# Patient Record
Sex: Female | Born: 1970 | Race: White | Hispanic: No | Marital: Married | Smoking: Never smoker
Health system: Southern US, Community
[De-identification: ages and names within clinical notes are randomized; demographics above are authoritative.]

## PROBLEM LIST (undated history)

## (undated) ENCOUNTER — Ambulatory Visit: Payer: 59

## (undated) DIAGNOSIS — K219 Gastro-esophageal reflux disease without esophagitis: Secondary | ICD-10-CM

## (undated) DIAGNOSIS — F419 Anxiety disorder, unspecified: Secondary | ICD-10-CM

## (undated) DIAGNOSIS — E669 Obesity, unspecified: Secondary | ICD-10-CM

## (undated) DIAGNOSIS — I209 Angina pectoris, unspecified: Secondary | ICD-10-CM

## (undated) DIAGNOSIS — R0600 Dyspnea, unspecified: Secondary | ICD-10-CM

## (undated) DIAGNOSIS — R109 Unspecified abdominal pain: Secondary | ICD-10-CM

## (undated) DIAGNOSIS — I350 Nonrheumatic aortic (valve) stenosis: Secondary | ICD-10-CM

## (undated) DIAGNOSIS — R011 Cardiac murmur, unspecified: Secondary | ICD-10-CM

## (undated) DIAGNOSIS — D239 Other benign neoplasm of skin, unspecified: Secondary | ICD-10-CM

## (undated) DIAGNOSIS — G4730 Sleep apnea, unspecified: Secondary | ICD-10-CM

## (undated) DIAGNOSIS — R631 Polydipsia: Secondary | ICD-10-CM

## (undated) DIAGNOSIS — J309 Allergic rhinitis, unspecified: Secondary | ICD-10-CM

## (undated) HISTORY — PX: APPENDECTOMY: SHX54

## (undated) HISTORY — DX: Anxiety disorder, unspecified: F41.9

## (undated) HISTORY — DX: Nonrheumatic aortic (valve) stenosis: I35.0

---

## 2001-02-26 ENCOUNTER — Other Ambulatory Visit: Admission: RE | Admit: 2001-02-26 | Discharge: 2001-02-26 | Payer: Self-pay | Admitting: Family Medicine

## 2004-11-17 ENCOUNTER — Ambulatory Visit: Payer: Self-pay | Admitting: Family Medicine

## 2005-09-26 ENCOUNTER — Observation Stay: Payer: Self-pay | Admitting: Unknown Physician Specialty

## 2005-10-20 ENCOUNTER — Ambulatory Visit: Payer: Self-pay

## 2005-11-02 ENCOUNTER — Inpatient Hospital Stay: Payer: Self-pay

## 2006-01-21 ENCOUNTER — Emergency Department: Payer: Self-pay | Admitting: Emergency Medicine

## 2007-09-05 ENCOUNTER — Ambulatory Visit: Payer: Self-pay | Admitting: Unknown Physician Specialty

## 2008-04-18 ENCOUNTER — Ambulatory Visit: Payer: Self-pay | Admitting: Internal Medicine

## 2008-06-19 HISTORY — PX: TONSILLECTOMY: SUR1361

## 2009-01-28 ENCOUNTER — Ambulatory Visit: Payer: Self-pay | Admitting: Unknown Physician Specialty

## 2009-06-19 HISTORY — PX: ABDOMINAL HYSTERECTOMY: SHX81

## 2009-09-06 ENCOUNTER — Ambulatory Visit: Payer: Self-pay | Admitting: Unknown Physician Specialty

## 2010-07-15 ENCOUNTER — Ambulatory Visit: Payer: Self-pay | Admitting: Internal Medicine

## 2010-07-21 ENCOUNTER — Emergency Department: Payer: Self-pay | Admitting: Emergency Medicine

## 2010-08-14 ENCOUNTER — Ambulatory Visit: Payer: Self-pay

## 2010-11-12 ENCOUNTER — Ambulatory Visit: Payer: Self-pay | Admitting: Family Medicine

## 2011-01-27 ENCOUNTER — Ambulatory Visit: Payer: Self-pay | Admitting: Family Medicine

## 2011-03-31 ENCOUNTER — Ambulatory Visit: Payer: Self-pay | Admitting: Internal Medicine

## 2012-03-31 ENCOUNTER — Ambulatory Visit: Payer: Self-pay | Admitting: Physician Assistant

## 2013-02-21 ENCOUNTER — Ambulatory Visit: Payer: Self-pay | Admitting: Family Medicine

## 2013-07-18 ENCOUNTER — Ambulatory Visit: Payer: Self-pay | Admitting: Physician Assistant

## 2013-10-30 ENCOUNTER — Encounter: Payer: Self-pay | Admitting: *Deleted

## 2013-11-05 ENCOUNTER — Encounter: Payer: Self-pay | Admitting: General Surgery

## 2013-11-05 ENCOUNTER — Other Ambulatory Visit: Payer: Managed Care, Other (non HMO)

## 2013-11-05 ENCOUNTER — Ambulatory Visit (INDEPENDENT_AMBULATORY_CARE_PROVIDER_SITE_OTHER): Payer: Managed Care, Other (non HMO) | Admitting: General Surgery

## 2013-11-05 VITALS — BP 134/76 | HR 74 | Resp 12 | Ht 66.0 in | Wt 251.0 lb

## 2013-11-05 DIAGNOSIS — N63 Unspecified lump in unspecified breast: Secondary | ICD-10-CM

## 2013-11-05 NOTE — Patient Instructions (Signed)
Patient to return in 3 months for follow up visit. Continue monthly self breast exams. Call office for any new breast issues or concerns.

## 2013-11-05 NOTE — Progress Notes (Signed)
Patient ID: Natasha Boyd, female   DOB: [birth year], 43 y.o.   MRN: [id number]  Chief Complaint  Patient presents with  . Other    Right breast nodule    HPI Natasha Boyd is a 43 y.o. female who presents for a breast evaluation. The most recent mammogram was done on 10/30/13 at [facility]. Patient does not  perform regular self breast checks and gets regular mammograms done. The patient states she noticed a right breast lump approximately 1 month ago. She states it has gotten larger. It is tender to the touch.  Also feels a small lump in right neck.  HPI  Past Medical History  Diagnosis Date  . Anxiety     Past Surgical History  Procedure Laterality Date  . Abdominal hysterectomy  2011  . Appendectomy  1996?  Marland Kitchen Cesarean section  2007  . Tonsillectomy  2010    Family History  Problem Relation Age of Onset  . Adopted: Yes    Social History History  Substance Use Topics  . Smoking status: Former Smoker -- 1.00 packs/day for 31 years    Types: Cigarettes  . Smokeless tobacco: Never Used  . Alcohol Use: Yes    Allergies  Allergen Reactions  . Iodine     Current Outpatient Prescriptions  Medication Sig Dispense Refill  . ALPRAZolam (XANAX) 0.25 MG tablet Take 0.25 mg by mouth as needed for anxiety.      Marland Kitchen escitalopram (LEXAPRO) 20 MG tablet Take 20 mg by mouth daily.       No current facility-administered medications for this visit.    Review of Systems Review of Systems  Constitutional: Negative.   Respiratory: Negative.   Cardiovascular: Negative.     Blood pressure 134/76, pulse 74, resp. rate 12, height 5\' 6"  (1.676 m), weight 251 lb (113.853 kg).  Physical Exam Physical Exam  Constitutional: She is oriented to person, place, and time. She appears well-developed and well-nourished.  Eyes: Conjunctivae are normal. No scleral icterus.  Neck: Neck supple. No thyromegaly present.  Cardiovascular: Normal rate, regular rhythm and normal heart sounds.    Pulmonary/Chest: Effort normal and breath sounds normal. Right breast exhibits mass (at 1 o'clock 1 cm superficial mass. ). Right breast exhibits no inverted nipple, no nipple discharge, no skin change and no tenderness. Left breast exhibits no inverted nipple, no mass, no nipple discharge, no skin change and no tenderness.     Abdominal: Soft. Bowel sounds are normal. There is no tenderness.  Lymphadenopathy:    She has cervical adenopathy ( 5 mm firm node upper right anterior cervical neck. ).    She has no axillary adenopathy.  Neurological: She is alert and oriented to person, place, and time.  Skin: Skin is warm and dry.    Data Reviewed  Mammogram report or films not available. Will try to obtain this.  Targeted ultrasound over palpable right breast mass shows no findings.  Assessment    Likely a benign finding in the right breast.     Plan    Recheck in 3 months for right breast mass and tiny cervical node.        Seeplaputhur G Sankar 11/05/2013, 6:01 PM

## 2014-02-02 ENCOUNTER — Ambulatory Visit: Payer: Self-pay | Admitting: General Surgery

## 2014-02-13 DIAGNOSIS — D239 Other benign neoplasm of skin, unspecified: Secondary | ICD-10-CM | POA: Insufficient documentation

## 2014-03-04 ENCOUNTER — Ambulatory Visit: Payer: Self-pay | Admitting: General Surgery

## 2014-03-10 ENCOUNTER — Ambulatory Visit: Payer: Self-pay | Admitting: Family Medicine

## 2014-03-16 LAB — HEPATIC FUNCTION PANEL
ALT: 35 U/L (ref 7–35)
AST: 26 U/L (ref 13–35)

## 2014-03-16 LAB — BASIC METABOLIC PANEL
BUN: 13 mg/dL (ref 4–21)
CREATININE: 0.9 mg/dL (ref 0.5–1.1)
GLUCOSE: 98 mg/dL
Potassium: 4.6 mmol/L (ref 3.4–5.3)
Sodium: 140 mmol/L (ref 137–147)

## 2014-03-16 LAB — LIPID PANEL
CHOLESTEROL: 201 mg/dL — AB (ref 0–200)
HDL: 34 mg/dL — AB (ref 35–70)
LDL Cholesterol: 125 mg/dL
Triglycerides: 209 mg/dL — AB (ref 40–160)

## 2014-03-16 LAB — CBC AND DIFFERENTIAL
HCT: 41 % (ref 36–46)
Hemoglobin: 14.1 g/dL (ref 12.0–16.0)
PLATELETS: 193 10*3/uL (ref 150–399)
WBC: 6.4 10*3/mL

## 2014-04-13 ENCOUNTER — Ambulatory Visit: Payer: Self-pay | Admitting: Physician Assistant

## 2014-04-15 ENCOUNTER — Encounter: Payer: Self-pay | Admitting: *Deleted

## 2014-04-20 ENCOUNTER — Encounter: Payer: Self-pay | Admitting: General Surgery

## 2014-08-28 LAB — HEMOGLOBIN A1C: HEMOGLOBIN A1C: 5.7 % (ref 4.0–6.0)

## 2014-09-17 ENCOUNTER — Emergency Department
Admit: 2014-09-17 | Disposition: A | Discharge status: Home / Self Care | Payer: Self-pay | Admitting: Emergency Medicine

## 2014-12-15 ENCOUNTER — Other Ambulatory Visit: Payer: Self-pay

## 2014-12-15 DIAGNOSIS — F988 Other specified behavioral and emotional disorders with onset usually occurring in childhood and adolescence: Secondary | ICD-10-CM

## 2014-12-15 MED ORDER — METHYLPHENIDATE HCL ER (OSM) 27 MG PO TBCR: 27.0000 mg | EXTENDED_RELEASE_TABLET | Freq: Every day | ORAL | Status: DC

## 2014-12-15 NOTE — Telephone Encounter (Signed)
Pt would like to change to Generic Concerta secondary to cost.  She tolerated Adderall well but her insurance will not cover it.    Thanks,   -Mickel Baas

## 2014-12-15 NOTE — Telephone Encounter (Signed)
Prescription printed. Please notify patient it is ready for pick up. Thanks- Dr. Malon Branton.  

## 2015-01-22 ENCOUNTER — Other Ambulatory Visit: Payer: Self-pay

## 2015-01-22 DIAGNOSIS — F988 Other specified behavioral and emotional disorders with onset usually occurring in childhood and adolescence: Secondary | ICD-10-CM

## 2015-01-22 MED ORDER — METHYLPHENIDATE HCL ER (OSM) 36 MG PO TBCR: 36.0000 mg | EXTENDED_RELEASE_TABLET | Freq: Every day | ORAL | Status: DC

## 2015-01-22 NOTE — Telephone Encounter (Signed)
Pt is calling for a refill on Concerta.  She reports tolerating it well, but she is not to goal.  She would like to try the next dose higher if possible.   Thanks,   -Mickel Baas

## 2015-01-22 NOTE — Telephone Encounter (Signed)
Printed.  Please notify patient. Thanks.  

## 2015-03-04 ENCOUNTER — Other Ambulatory Visit: Payer: Self-pay

## 2015-03-04 DIAGNOSIS — F988 Other specified behavioral and emotional disorders with onset usually occurring in childhood and adolescence: Secondary | ICD-10-CM

## 2015-03-04 MED ORDER — METHYLPHENIDATE HCL ER (OSM) 36 MG PO TBCR: 36.0000 mg | EXTENDED_RELEASE_TABLET | Freq: Every day | ORAL | Status: DC

## 2015-03-04 NOTE — Telephone Encounter (Signed)
Pt reports Concerta 36mg  works very well for her.  She would like to pick up 3 months of prescriptions.    Thanks,   -Mickel Baas

## 2015-03-04 NOTE — Telephone Encounter (Signed)
Printed.  Please notify patient. Thanks.  

## 2015-03-16 ENCOUNTER — Encounter: Payer: Self-pay | Admitting: Family Medicine

## 2015-03-16 ENCOUNTER — Ambulatory Visit (INDEPENDENT_AMBULATORY_CARE_PROVIDER_SITE_OTHER): Payer: Managed Care, Other (non HMO) | Admitting: Family Medicine

## 2015-03-16 VITALS — BP 118/72 | HR 98 | Temp 98.2°F | Resp 16 | Ht 65.5 in | Wt 252.0 lb

## 2015-03-16 DIAGNOSIS — Z23 Encounter for immunization: Secondary | ICD-10-CM | POA: Insufficient documentation

## 2015-03-16 DIAGNOSIS — R748 Abnormal levels of other serum enzymes: Secondary | ICD-10-CM

## 2015-03-16 DIAGNOSIS — F419 Anxiety disorder, unspecified: Secondary | ICD-10-CM | POA: Insufficient documentation

## 2015-03-16 DIAGNOSIS — M545 Low back pain, unspecified: Secondary | ICD-10-CM | POA: Insufficient documentation

## 2015-03-16 DIAGNOSIS — R739 Hyperglycemia, unspecified: Secondary | ICD-10-CM | POA: Insufficient documentation

## 2015-03-16 DIAGNOSIS — E669 Obesity, unspecified: Secondary | ICD-10-CM | POA: Insufficient documentation

## 2015-03-16 DIAGNOSIS — Z111 Encounter for screening for respiratory tuberculosis: Secondary | ICD-10-CM

## 2015-03-16 DIAGNOSIS — G4730 Sleep apnea, unspecified: Secondary | ICD-10-CM | POA: Insufficient documentation

## 2015-03-16 DIAGNOSIS — K219 Gastro-esophageal reflux disease without esophagitis: Secondary | ICD-10-CM | POA: Insufficient documentation

## 2015-03-16 DIAGNOSIS — J309 Allergic rhinitis, unspecified: Secondary | ICD-10-CM | POA: Insufficient documentation

## 2015-03-16 DIAGNOSIS — F988 Other specified behavioral and emotional disorders with onset usually occurring in childhood and adolescence: Secondary | ICD-10-CM

## 2015-03-16 DIAGNOSIS — E781 Pure hyperglyceridemia: Secondary | ICD-10-CM | POA: Insufficient documentation

## 2015-03-16 DIAGNOSIS — F909 Attention-deficit hyperactivity disorder, unspecified type: Secondary | ICD-10-CM

## 2015-03-16 DIAGNOSIS — Z0184 Encounter for antibody response examination: Secondary | ICD-10-CM

## 2015-03-16 NOTE — Progress Notes (Signed)
Subjective:    Patient ID: Natasha Boyd, female    DOB: 1971/05/25, 44 y.o.   MRN: [id number]  HPI  Immunization Update:    Pt is coming in today to update her Immunizations for school.  She will need her Tdap and Flu vaccine, and will and need an MMR Titer.    ADHD:  Pt is also following up with her ADHD, she has recently increased her Concerta to $RemoveBef'36mg'AxhMIclubV$  a day.  She reports good tolerance and symptom control.  No side effects to the medication.  More active.  Really doing much better.  Has been able to prioritize and is going back to school to get her Master's.  Mood is much better also.    Patient Active Problem List   Diagnosis Date Noted  . Adult-onset obesity 03/16/2015  . Allergic rhinitis 03/16/2015  . Anxiety 03/16/2015  . Abnormal liver enzymes 03/16/2015  . Acid reflux 03/16/2015  . Blood glucose elevated 03/16/2015  . Hypertriglyceridemia 03/16/2015  . LBP (low back pain) 03/16/2015  . Apnea, sleep 03/16/2015  . ADD (attention deficit disorder) 12/15/2014   Family History  Problem Relation Age of Onset  . Adopted: Yes  . Family history unknown: Yes   Social History   Social History  . Marital Status: Married    Spouse Name: N/A  . Number of Children: N/A  . Years of Education: N/A   Occupational History  . Not on file.   Social History Main Topics  . Smoking status: Former Smoker -- 1.00 packs/day for 31 years    Types: Cigarettes  . Smokeless tobacco: Never Used  . Alcohol Use: Yes  . Drug Use: No  . Sexual Activity: Not on file   Other Topics Concern  . Not on file   Social History Narrative   Past Surgical History  Procedure Laterality Date  . Abdominal hysterectomy  2011  . Appendectomy  1996?  Marland Kitchen Cesarean section  2007  . Tonsillectomy  2010   Allergies  Allergen Reactions  . Iodine    Previous Medications   ALPRAZOLAM (XANAX) 0.25 MG TABLET    Take 0.25 mg by mouth as needed for anxiety.   AMPHETAMINE-DEXTROAMPHETAMINE  (ADDERALL XR) 10 MG 24 HR CAPSULE    Take by mouth.   ESCITALOPRAM (LEXAPRO) 20 MG TABLET    Take 20 mg by mouth daily.   FLUTICASONE (FLONASE) 50 MCG/ACT NASAL SPRAY    Place into the nose.   LANSOPRAZOLE (PREVACID) 30 MG CAPSULE    Take by mouth.   METAXALONE (SKELAXIN) 800 MG TABLET    Take by mouth.   METHYLPHENIDATE (CONCERTA) 36 MG PO CR TABLET    Take 1 tablet (36 mg total) by mouth daily. May be filled after 04/03/2015   METHYLPHENIDATE (CONCERTA) 36 MG PO CR TABLET    Take 1 tablet (36 mg total) by mouth daily. May be filled after 05/04/2015   There were no vitals taken for this visit.   Review of Systems  Constitutional: Negative.   Respiratory: Negative.   Cardiovascular: Negative.   Gastrointestinal: Negative.   Neurological: Negative for dizziness, light-headedness and headaches.  Psychiatric/Behavioral: Negative for suicidal ideas, hallucinations, behavioral problems, confusion, sleep disturbance, self-injury, dysphoric mood, decreased concentration and agitation. The patient is nervous/anxious (Has improved greatly since starting Concerta.). The patient is not hyperactive.        Objective:   Physical Exam  Constitutional: She is oriented to person, place, and time. She appears well-developed  and well-nourished.  Cardiovascular: Normal rate and regular rhythm.   Pulmonary/Chest: Effort normal and breath sounds normal.  Neurological: She is alert and oriented to person, place, and time.  Psychiatric: She has a normal mood and affect. Her behavior is normal. Judgment and thought content normal.   BP 118/72 mmHg  Pulse 98  Temp(Src) 98.2 F (36.8 C) (Oral)  Resp 16  Ht 5' 5.5" (1.664 m)  Wt 252 lb (114.306 kg)  BMI 41.28 kg/m2    Assessment & Plan:  1. Immunization due Given today.  - Tdap vaccine greater than or equal to 7yo IM  2. Attention deficit hyperactivity disorder (ADHD), unspecified ADHD type Stable. Continue medication.    3. Abnormal liver  enzymes Will check labs. Has lost some weight.  - Comprehensive metabolic panel  4. Anxiety Improving. Continue medication.   5. Screening-pulmonary TB - Quantiferon tb gold assay  7. Immunity status testing - Measles/Mumps/Rubella Immunity  8. Flu vaccine need - Flu Vaccine QUAD 36+ mos PF IM (Fluarix & Fluzone Quad PF)  Margarita Rana, MD

## 2015-03-18 LAB — COMPREHENSIVE METABOLIC PANEL
ALK PHOS: 84 IU/L (ref 39–117)
ALT: 71 IU/L — AB (ref 0–32)
AST: 54 IU/L — AB (ref 0–40)
Albumin/Globulin Ratio: 1.5 (ref 1.1–2.5)
Albumin: 4.1 g/dL (ref 3.5–5.5)
BUN/Creatinine Ratio: 13 (ref 9–23)
BUN: 13 mg/dL (ref 6–24)
Bilirubin Total: <0.2 mg/dL (ref 0.0–1.2)
CALCIUM: 9.2 mg/dL (ref 8.7–10.2)
CO2: 25 mmol/L (ref 18–29)
CREATININE: 0.99 mg/dL (ref 0.57–1.00)
Chloride: 100 mmol/L (ref 97–108)
GFR calc Af Amer: 80 mL/min/{1.73_m2} (ref >59)
GFR, EST NON AFRICAN AMERICAN: 69 mL/min/{1.73_m2} (ref >59)
GLOBULIN, TOTAL: 2.8 g/dL (ref 1.5–4.5)
GLUCOSE: 90 mg/dL (ref 65–99)
Potassium: 4.6 mmol/L (ref 3.5–5.2)
SODIUM: 141 mmol/L (ref 134–144)
Total Protein: 6.9 g/dL (ref 6.0–8.5)

## 2015-03-18 LAB — MEASLES/MUMPS/RUBELLA IMMUNITY
MUMPS ABS, IGG: 39.2 AU/mL (ref >10.9)
RUBELLA: 4.06 {index} (ref >0.99)

## 2015-03-19 ENCOUNTER — Telehealth: Payer: Self-pay

## 2015-03-19 LAB — QUANTIFERON TB GOLD ASSAY (BLOOD)
QFT TB AG MINUS NIL VALUE: 0 [IU]/mL
QUANTIFERON MITOGEN VALUE: 9.09 [IU]/mL
QUANTIFERON TB AG VALUE: 0.05 [IU]/mL
QUANTIFERON TB GOLD: NEGATIVE
Quantiferon Nil Value: 0.05 IU/mL

## 2015-03-19 NOTE — Telephone Encounter (Signed)
Pt would like to schedule appointment with you for elevated liver enzymes. Advised pt to get MMR vaccine. Renaldo Fiddler, CMA

## 2015-03-19 NOTE — Telephone Encounter (Signed)
-----  Message from Margarita Rana, MD sent at 03/18/2015 10:32 AM EDT ----- Elevated liver enzymes again.   Time for a referral and work up, or I could start the work up here. Also, not immune to measles.  Recommend MMR.  Thanks.

## 2015-03-23 ENCOUNTER — Telehealth: Payer: Self-pay

## 2015-03-23 NOTE — Telephone Encounter (Signed)
-----   Message from Margarita Rana, MD sent at 03/19/2015  7:43 PM EDT ----- TB negative. Please notify patient and finish form. Thanks.

## 2015-03-23 NOTE — Telephone Encounter (Signed)
Pt advised.   Thanks,   -Taren Toops  

## 2015-06-09 ENCOUNTER — Encounter: Payer: Self-pay | Admitting: Family Medicine

## 2015-06-09 ENCOUNTER — Ambulatory Visit (INDEPENDENT_AMBULATORY_CARE_PROVIDER_SITE_OTHER): Payer: Managed Care, Other (non HMO) | Admitting: Family Medicine

## 2015-06-09 VITALS — BP 118/74 | HR 88 | Temp 98.0°F | Resp 16 | Wt 254.0 lb

## 2015-06-09 DIAGNOSIS — F909 Attention-deficit hyperactivity disorder, unspecified type: Secondary | ICD-10-CM | POA: Diagnosis not present

## 2015-06-09 DIAGNOSIS — R748 Abnormal levels of other serum enzymes: Secondary | ICD-10-CM | POA: Diagnosis not present

## 2015-06-09 DIAGNOSIS — F988 Other specified behavioral and emotional disorders with onset usually occurring in childhood and adolescence: Secondary | ICD-10-CM

## 2015-06-09 MED ORDER — METHYLPHENIDATE HCL ER (OSM) 36 MG PO TBCR: 36.0000 mg | EXTENDED_RELEASE_TABLET | Freq: Every day | ORAL | Status: DC

## 2015-06-09 NOTE — Progress Notes (Signed)
Patient ID: Natasha Boyd, female   DOB: [birth year], 44 y.o.   MRN: [id number]         Patient: Natasha Boyd Female    DOB: 1970-12-11   44 y.o.   MRN: [id number] Visit Date: 06/09/2015  Today's Provider: Margarita Rana, MD   Chief Complaint  Patient presents with  . Elevated Hepatic Enzymes   Subjective:    HPI Liver Dysfunction: Patient complains of an abnormal AST and ALT that has been associated with no obvious evidence for illness. The problem was first noted  1 year ago.  Patient denies blood transfusion, use of illicit drugs, unprotected  sex and foreign travel.      Allergies  Allergen Reactions  . Iodine    Previous Medications   ALPRAZOLAM (XANAX) 0.25 MG TABLET    Take 0.25 mg by mouth as needed for anxiety.   AMPHETAMINE-DEXTROAMPHETAMINE (ADDERALL XR) 10 MG 24 HR CAPSULE    Take by mouth.   ESCITALOPRAM (LEXAPRO) 20 MG TABLET    Take 20 mg by mouth daily.   FLUTICASONE (FLONASE) 50 MCG/ACT NASAL SPRAY    Place into the nose.   LANSOPRAZOLE (PREVACID) 30 MG CAPSULE    Take by mouth.   METAXALONE (SKELAXIN) 800 MG TABLET    Take by mouth.   METHYLPHENIDATE (CONCERTA) 36 MG PO CR TABLET    Take 1 tablet (36 mg total) by mouth daily. May be filled after 04/03/2015   METHYLPHENIDATE (CONCERTA) 36 MG PO CR TABLET    Take 1 tablet (36 mg total) by mouth daily. May be filled after 05/04/2015    Review of Systems  Constitutional: Negative.   Respiratory: Negative.   Cardiovascular: Negative.   Gastrointestinal: Negative.   Musculoskeletal: Negative.   Neurological: Negative for dizziness, light-headedness and headaches.    Social History  Substance Use Topics  . Smoking status: Never Smoker   . Smokeless tobacco: Never Used  . Alcohol Use: Yes     Comment: Occasionally   Objective:   BP 118/74 mmHg  Pulse 88  Temp(Src) 98 F (36.7 C) (Oral)  Resp 16  Wt 254 lb (115.214 kg)  Physical Exam  Constitutional: She is oriented to person, place, and  time. She appears well-developed and well-nourished.  Cardiovascular: Normal rate, regular rhythm and normal heart sounds.   Pulmonary/Chest: Effort normal and breath sounds normal.  Neurological: She is alert and oriented to person, place, and time.  Psychiatric: She has a normal mood and affect. Her behavior is normal. Judgment and thought content normal.        Assessment & Plan:     1. ADD (attention deficit disorder) Condition is stable. Please continue current medication and  plan of care as noted.   - methylphenidate (CONCERTA) 36 MG PO CR tablet; Take 1 tablet (36 mg total) by mouth daily. May be filled after 01/21/217  Dispense: 30 tablet; Refill: 0 - methylphenidate (CONCERTA) 36 MG PO CR tablet; Take 1 tablet (36 mg total) by mouth daily. May be filled after 08/10/2015  Dispense: 30 tablet; Refill: 0  2. Elevated liver enzymes Elevated last check. Patient does not know family history.   Suspect fatty liver but will obtain labs and ultrasound to rule out other etiology.  - Hepatitis C Antibody - Hepatitis B Surface AntiGEN - Hepatitis B Surface AntiBODY - Hepatitis B Core Antibody, total - Antinuclear Antib (ANA) - Anti-Smooth Muscle Antibody, IGG - Iron, TIBC and Ferritin Panel - Comprehensive metabolic panel -  TSH - Serum protein electrophoresis with reflex - Alpha-1-Antitrypsin Deficiency - US Abdomen Limited RUQ - Ceruloplasmin     Patient was seen and examined by Jerrell Belfast, MD, and note scribed by Ashley Royalty, CMA.  I have reviewed the document for accuracy and completeness and I agree with above. - Jerrell Belfast, MD   Margarita Rana, MD  [facility]

## 2015-06-15 ENCOUNTER — Ambulatory Visit
Admission: RE | Admit: 2015-06-15 | Discharge: 2015-06-15 | Disposition: A | Discharge status: Home / Self Care | Payer: Managed Care, Other (non HMO) | Source: Ambulatory Visit | Attending: Family Medicine | Admitting: Family Medicine

## 2015-06-15 ENCOUNTER — Telehealth: Payer: Self-pay

## 2015-06-15 DIAGNOSIS — R748 Abnormal levels of other serum enzymes: Secondary | ICD-10-CM | POA: Diagnosis present

## 2015-06-15 NOTE — Telephone Encounter (Signed)
-----   Message from Margarita Rana, MD sent at 06/15/2015  8:57 AM EST ----- Ultrasound consistent with fatty liver.  Will await labs. Thanks.

## 2015-06-15 NOTE — Telephone Encounter (Signed)
Informed pt as below. Natasha Boyd, CMA  

## 2015-06-18 LAB — ALPHA-1-ANTITRYPSIN DEFICIENCY

## 2015-06-18 LAB — COMPREHENSIVE METABOLIC PANEL
ALBUMIN: 4.2 g/dL (ref 3.5–5.5)
ALT: 62 IU/L — ABNORMAL HIGH (ref 0–32)
AST: 47 IU/L — ABNORMAL HIGH (ref 0–40)
Albumin/Globulin Ratio: 1.6 (ref 1.1–2.5)
Alkaline Phosphatase: 87 IU/L (ref 39–117)
BUN / CREAT RATIO: 15 (ref 9–23)
BUN: 14 mg/dL (ref 6–24)
Bilirubin Total: 0.3 mg/dL (ref 0.0–1.2)
CALCIUM: 9.5 mg/dL (ref 8.7–10.2)
CO2: 26 mmol/L (ref 18–29)
CREATININE: 0.93 mg/dL (ref 0.57–1.00)
Chloride: 101 mmol/L (ref 96–106)
GFR calc Af Amer: 86 mL/min/{1.73_m2} (ref >59)
GFR, EST NON AFRICAN AMERICAN: 74 mL/min/{1.73_m2} (ref >59)
GLOBULIN, TOTAL: 2.6 g/dL (ref 1.5–4.5)
Glucose: 101 mg/dL — ABNORMAL HIGH (ref 65–99)
Potassium: 4.3 mmol/L (ref 3.5–5.2)
SODIUM: 142 mmol/L (ref 134–144)
Total Protein: 6.8 g/dL (ref 6.0–8.5)

## 2015-06-18 LAB — PROTEIN ELECTROPHORESIS, SERUM, WITH REFLEX
A/G Ratio: 1.1 (ref 0.7–1.7)
ALPHA 2: 0.7 g/dL (ref 0.4–1.0)
Albumin ELP: 3.5 g/dL (ref 2.9–4.4)
Alpha 1: 0.2 g/dL (ref 0.0–0.4)
BETA: 1.1 g/dL (ref 0.7–1.3)
GLOBULIN, TOTAL: 3.3 g/dL (ref 2.2–3.9)
Gamma Globulin: 1.2 g/dL (ref 0.4–1.8)

## 2015-06-18 LAB — ANA: Anti Nuclear Antibody(ANA): NEGATIVE

## 2015-06-18 LAB — HEPATITIS B SURFACE ANTIBODY,QUALITATIVE: Hep B Surface Ab, Qual: NONREACTIVE

## 2015-06-18 LAB — TSH: TSH: 2.05 u[IU]/mL (ref 0.450–4.500)

## 2015-06-18 LAB — ANTI-SMOOTH MUSCLE ANTIBODY, IGG: Smooth Muscle Ab: 17 Units (ref 0–19)

## 2015-06-18 LAB — HEPATITIS C ANTIBODY

## 2015-06-18 LAB — HEPATITIS B SURFACE ANTIGEN: HEP B S AG: NEGATIVE

## 2015-06-18 LAB — CERULOPLASMIN: Ceruloplasmin: 34 mg/dL (ref 19.0–39.0)

## 2015-06-18 LAB — HEPATITIS B CORE ANTIBODY, TOTAL: HEP B C TOTAL AB: NEGATIVE

## 2015-06-22 ENCOUNTER — Telehealth: Payer: Self-pay

## 2015-06-22 DIAGNOSIS — R748 Abnormal levels of other serum enzymes: Secondary | ICD-10-CM

## 2015-06-22 NOTE — Telephone Encounter (Signed)
Left message to call back  

## 2015-06-22 NOTE — Telephone Encounter (Signed)
-----   Message from Margarita Rana, MD sent at 06/19/2015  8:04 AM EST ----- Labs all normal.  Liver enzymes very slightly lower than previous.   One set of studies ordered from wrong lab. Please have patient return for ferritin levels.  Thanks.

## 2015-06-29 NOTE — Telephone Encounter (Signed)
Advised patient as below. Lab slip printed.  

## 2015-08-24 ENCOUNTER — Other Ambulatory Visit: Payer: Self-pay

## 2015-08-24 DIAGNOSIS — J019 Acute sinusitis, unspecified: Secondary | ICD-10-CM

## 2015-08-24 MED ORDER — AMOXICILLIN-POT CLAVULANATE 875-125 MG PO TABS: 1.0000 | ORAL_TABLET | Freq: Two times a day (BID) | ORAL | Status: DC

## 2015-08-24 NOTE — Telephone Encounter (Signed)
Pt calls to report that she has had a "bad cold" for several weeks.  Her cold has improved but she now has sinus pain/pressure and would like an antibiotic sent into [facility] in [place],   Thanks,   -Mickel Baas

## 2015-08-24 NOTE — Telephone Encounter (Signed)
Ok to send augmentin 875 bid for 10 days. Thanks.

## 2015-10-03 ENCOUNTER — Encounter: Payer: Self-pay | Admitting: *Deleted

## 2015-10-03 ENCOUNTER — Ambulatory Visit
Admission: EM | Admit: 2015-10-03 | Discharge: 2015-10-03 | Disposition: A | Discharge status: Home / Self Care | Payer: Managed Care, Other (non HMO) | Attending: Family Medicine | Admitting: Family Medicine

## 2015-10-03 ENCOUNTER — Ambulatory Visit (INDEPENDENT_AMBULATORY_CARE_PROVIDER_SITE_OTHER): Payer: Managed Care, Other (non HMO)

## 2015-10-03 DIAGNOSIS — J189 Pneumonia, unspecified organism: Secondary | ICD-10-CM | POA: Diagnosis not present

## 2015-10-03 LAB — RAPID INFLUENZA A&B ANTIGENS: Influenza B (ARMC): NEGATIVE

## 2015-10-03 LAB — RAPID INFLUENZA A&B ANTIGENS (ARMC ONLY): INFLUENZA A (ARMC): NEGATIVE

## 2015-10-03 MED ORDER — HYDROCOD POLST-CPM POLST ER 10-8 MG/5ML PO SUER: 5.0000 mL | Freq: Two times a day (BID) | ORAL | Status: DC

## 2015-10-03 MED ORDER — AZITHROMYCIN 250 MG PO TABS: ORAL_TABLET | ORAL | Status: DC

## 2015-10-03 NOTE — Discharge Instructions (Signed)

## 2015-10-03 NOTE — ED Notes (Signed)
Pt states that she has had a fever since Wednesday, started with a cough on Friday.

## 2015-10-03 NOTE — ED Provider Notes (Signed)
CSN: [id number]     Arrival date & time 10/03/15  1030 History   First MD Initiated Contact with Patient 10/03/15 1049     Chief Complaint  Patient presents with  . Fever  . Cough   (Consider location/radiation/quality/duration/timing/severity/associated sxs/prior Treatment) HPI   This is a 45 year old female who presents with a cough that started 2 days ago with a fever that started 4 days ago. She has had her flu shot in the past and her 2 sons also had flu in March and the other about 3 weeks ago. She has a productive cough of yellow-green sputum. States that she has a lot of chest discomfort whenever she coughs or breathes deeply and indicates mostly anterior. She has recently got over a sinus infection and was treated with Augmentin.  Past Medical History  Diagnosis Date  . Anxiety    Past Surgical History  Procedure Laterality Date  . Abdominal hysterectomy  2011  . Appendectomy  1996?  Marland Kitchen Cesarean section  2007  . Tonsillectomy  2010   Family History  Problem Relation Age of Onset  . Adopted: Yes  . Family history unknown: Yes   Social History  Substance Use Topics  . Smoking status: Never Smoker   . Smokeless tobacco: Never Used  . Alcohol Use: Yes     Comment: Occasionally   OB History    Gravida Para Term Preterm AB TAB SAB Ectopic Multiple Living   2 2        2       Obstetric Comments   1st Menstrual Cycle: 12  1st Pregnancy: 29     Review of Systems  Constitutional: Positive for fever, chills, activity change and fatigue.  HENT: Positive for congestion and rhinorrhea.   Respiratory: Positive for cough, shortness of breath and wheezing. Negative for stridor.   All other systems reviewed and are negative.   Allergies  Iodine  Home Medications   Prior to Admission medications   Medication Sig Start Date End Date Taking? Authorizing Provider  ALPRAZolam Duanne Moron) 0.25 MG tablet Take 0.25 mg by mouth as needed for anxiety.    Historical Provider, MD   amoxicillin-clavulanate (AUGMENTIN) 875-125 MG tablet Take 1 tablet by mouth 2 (two) times daily. 08/24/15   Margarita Rana, MD  amphetamine-dextroamphetamine (ADDERALL XR) 10 MG 24 hr capsule Take by mouth. 11/09/14   Historical Provider, MD  azithromycin (ZITHROMAX Z-PAK) 250 MG tablet Use as per package instructions 10/03/15   Lorin Picket, PA-C  chlorpheniramine-HYDROcodone [facility]) 10-8 MG/5ML SUER Take 5 mLs by mouth 2 (two) times daily. 10/03/15   Lorin Picket, PA-C  escitalopram (LEXAPRO) 20 MG tablet Take 20 mg by mouth daily.    Historical Provider, MD  fluticasone (FLONASE) 50 MCG/ACT nasal spray Place into the nose. 10/01/14   Historical Provider, MD  lansoprazole (PREVACID) 30 MG capsule Take by mouth. 09/30/13   Historical Provider, MD  metaxalone (SKELAXIN) 800 MG tablet Take by mouth. 08/28/14   Historical Provider, MD  methylphenidate (CONCERTA) 36 MG PO CR tablet Take 1 tablet (36 mg total) by mouth daily. May be filled after 01/21/217 06/09/15   Margarita Rana, MD  methylphenidate (CONCERTA) 36 MG PO CR tablet Take 1 tablet (36 mg total) by mouth daily. May be filled after 08/10/2015 06/09/15   Margarita Rana, MD   Meds Ordered and Administered this Visit  Medications - No data to display  BP 123/82 mmHg  Pulse 106  Temp(Src) 99.4 F (  37.4 C) (Oral)  Ht 5\' 6"  (1.676 m)  Wt 240 lb (108.863 kg)  BMI 38.76 kg/m2  SpO2 97% No data found.   Physical Exam  Constitutional: She is oriented to person, place, and time. She appears well-developed and well-nourished. No distress.  HENT:  Head: Normocephalic and atraumatic.  Right Ear: External ear normal.  Left Ear: External ear normal.  Nose: Nose normal.  Mouth/Throat: Oropharynx is clear and moist. No oropharyngeal exudate.  Eyes: Conjunctivae are normal. Pupils are equal, round, and reactive to light. Right eye exhibits no discharge. Left eye exhibits no discharge.  Neck: Normal range of motion. Neck  supple.  Pulmonary/Chest: Effort normal. No respiratory distress. She has rales.  Exam is no longer shows a consolidated area of crackles in the right lower lobe.  Musculoskeletal: Normal range of motion. She exhibits no edema or tenderness.  Lymphadenopathy:    She has no cervical adenopathy.  Neurological: She is alert and oriented to person, place, and time.  Skin: Skin is warm and dry. She is not diaphoretic.  Psychiatric: She has a normal mood and affect. Her behavior is normal. Judgment and thought content normal.  Nursing note and vitals reviewed.   ED Course  Procedures (including critical care time)  Labs Review Labs Reviewed  RAPID INFLUENZA A&B ANTIGENS [facility] ONLY)    Imaging Review Dg Chest 2 View  10/03/2015  CLINICAL DATA:  Cough for the past 3 days. EXAM: CHEST  2 VIEW COMPARISON:  None. FINDINGS: Normal sized heart. Small amount of airspace opacity in the right lower lung zone on the frontal view. This is below the major fissure on the lateral view. Clear left lung. Minimal thoracic spine degenerative changes. IMPRESSION: Mild right lower lobe pneumonia. Electronically Signed   By: Claudie Revering M.D.   On: 10/03/2015 11:39     Visual Acuity Review  Right Eye Distance:   Left Eye Distance:   Bilateral Distance:    Right Eye Near:   Left Eye Near:    Bilateral Near:         MDM   1. CAP (community acquired pneumonia)    New Prescriptions   AZITHROMYCIN (ZITHROMAX Z-PAK) 250 MG TABLET    Use as per package instructions   CHLORPHENIRAMINE-HYDROCODONE (TUSSIONEX PENNKINETIC ER) 10-8 MG/5ML SUER    Take 5 mLs by mouth 2 (two) times daily.  Plan: 1. Test/x-ray results and diagnosis reviewed with patient 2. rx as per orders; risks, benefits, potential side effects reviewed with patient 3. Recommend supportive treatment with Rest and fluids. If she is not improving or seems worsening of her that she needs to go the emergency room. She should stay out of school  until her temperature has resolved and she is no longer coughing. She'll follow-up with the primary care physician in 4-6 weeks for confirmation of resolution 4. F/u prn if symptoms worsen or don't improve     Lorin Picket, PA-C 10/03/15 1206

## 2015-10-06 ENCOUNTER — Other Ambulatory Visit: Payer: Self-pay

## 2015-10-06 ENCOUNTER — Telehealth: Payer: Self-pay

## 2015-10-06 DIAGNOSIS — F988 Other specified behavioral and emotional disorders with onset usually occurring in childhood and adolescence: Secondary | ICD-10-CM

## 2015-10-06 MED ORDER — METHYLPHENIDATE HCL ER (OSM) 36 MG PO TBCR: 36.0000 mg | EXTENDED_RELEASE_TABLET | Freq: Every day | ORAL | Status: DC

## 2015-10-06 NOTE — Telephone Encounter (Signed)
Pt would like three months please.   Thanks,   -Mickel Baas

## 2015-10-06 NOTE — Telephone Encounter (Signed)
Follow up for repeat CXR in 4 weeks. Thanks.

## 2015-10-06 NOTE — Telephone Encounter (Signed)
Pt advised. Apt made for 11/03/2015  Thanks,   -Mickel Baas

## 2015-10-06 NOTE — Addendum Note (Signed)
Addended by: Ashley Royalty E on: 10/06/2015 03:06 PM   Modules accepted: Orders

## 2015-10-06 NOTE — Telephone Encounter (Signed)
Pt called to report that she was seen at the urgent care Sunday.  She was diagnosed with pneumonia and was prescribed a Zpak and cough medicine.  She says today she feels like she has turned a corner; she has not had a fever in 24 hours.  When should she schedule a follow up visit with you?   Thanks,   -Mickel Baas

## 2015-10-06 NOTE — Telephone Encounter (Signed)
Prescription printed. Please notify patient it is ready for pick up. Thanks- Dr. Lisabeth Mian.  

## 2015-11-03 ENCOUNTER — Ambulatory Visit (INDEPENDENT_AMBULATORY_CARE_PROVIDER_SITE_OTHER): Payer: Managed Care, Other (non HMO) | Admitting: Family Medicine

## 2015-11-03 ENCOUNTER — Encounter: Payer: Self-pay | Admitting: Family Medicine

## 2015-11-03 VITALS — BP 112/68 | HR 80 | Temp 97.9°F | Resp 16 | Wt 251.0 lb

## 2015-11-03 DIAGNOSIS — J189 Pneumonia, unspecified organism: Secondary | ICD-10-CM | POA: Diagnosis not present

## 2015-11-03 DIAGNOSIS — E669 Obesity, unspecified: Secondary | ICD-10-CM | POA: Diagnosis not present

## 2015-11-03 DIAGNOSIS — R011 Cardiac murmur, unspecified: Secondary | ICD-10-CM | POA: Diagnosis not present

## 2015-11-03 NOTE — Progress Notes (Signed)
Patient ID: Natasha Boyd, female   DOB: [birth year], 45 y.o.   MRN: [id number]        Patient: Natasha Boyd Female    DOB: [birth year]   45 y.o.   MRN: [id number] Visit Date: 11/03/2015  Today's Provider: Margarita Rana, MD   Chief Complaint  Patient presents with  . Follow-up    Urgent-care  . Pneumonia   Subjective:    Pneumonia This is a new problem. The current episode started 1 to 4 weeks ago. The problem has been resolved. Pertinent negatives include no chest pain, dyspnea on exertion, ear congestion, ear pain, fever, headaches, heartburn, malaise/fatigue, myalgias, nasal congestion, orthopnea, PND, postnasal drip, rhinorrhea, sneezing, sore throat, sweats or trouble swallowing. Her symptoms are alleviated by prescription cough suppressant (Antibiotics). She reports complete improvement on treatment. There are no known risk factors for lung disease.   Has murmur. No current symptoms.  Did not schedule follow up.    Also wants to talk about weight loss. She is still gaining weight. Is trying to loose, but has been unsuccessful.      Allergies  Allergen Reactions  . Iodine    Previous Medications   ALPRAZOLAM (XANAX) 0.25 MG TABLET    Take 0.25 mg by mouth as needed for anxiety.   ESCITALOPRAM (LEXAPRO) 20 MG TABLET    Take 20 mg by mouth daily.   FLUTICASONE (FLONASE) 50 MCG/ACT NASAL SPRAY    Place into the nose.   METAXALONE (SKELAXIN) 800 MG TABLET    Take by mouth.   METHYLPHENIDATE (CONCERTA) 36 MG PO CR TABLET    Take 1 tablet (36 mg total) by mouth daily. May be filled after 12/06/2015    Review of Systems  Constitutional: Negative.  Negative for fever and malaise/fatigue.  HENT: Negative.  Negative for ear pain, postnasal drip, rhinorrhea, sneezing, sore throat and trouble swallowing.   Respiratory: Negative.   Cardiovascular: Negative.  Negative for chest pain, dyspnea on exertion and PND.  Gastrointestinal: Negative for heartburn.  Musculoskeletal:  Negative for myalgias.  Neurological: Negative for dizziness, weakness, light-headedness, numbness and headaches.    Social History  Substance Use Topics  . Smoking status: Never Smoker   . Smokeless tobacco: Never Used  . Alcohol Use: Yes     Comment: Occasionally   Objective:   BP 112/68 mmHg  Pulse 80  Temp(Src) 97.9 F (36.6 C) (Oral)  Resp 16  Wt 251 lb (113.853 kg)  Physical Exam  Constitutional: She is oriented to person, place, and time. She appears well-developed and well-nourished.  Cardiovascular: Normal rate and regular rhythm.   Murmur heard. Pulmonary/Chest: Effort normal and breath sounds normal.  Neurological: She is alert and oriented to person, place, and time.  Skin: Skin is warm and dry.  Psychiatric: She has a normal mood and affect. Her behavior is normal. Judgment and thought content normal.      Assessment & Plan:      1. Pneumonia, unspecified laterality, unspecified part of lung Pt feels much better; will repeat chest x-ray to make sure it is completely resolved.  - DG Chest 2 View  2. Murmur, cardiac Will follow up with Cardiology.   - Ambulatory referral to Cardiology  3. Adult-onset obesity Talk in great detailed about lifestyle changes, going back to a formal diet such at weight watcher versus surgery.    Patient was seen and examined by Jerrell Belfast, MD, and note scribed by Ashley Royalty, CMA.   I  have reviewed the document for accuracy and completeness and I agree with above. - Jerrell Belfast, MD     Margarita Rana, MD  [facility]

## 2015-11-11 ENCOUNTER — Other Ambulatory Visit: Payer: Self-pay | Admitting: Family Medicine

## 2015-11-11 DIAGNOSIS — F419 Anxiety disorder, unspecified: Secondary | ICD-10-CM

## 2015-11-17 ENCOUNTER — Observation Stay: Payer: Managed Care, Other (non HMO)

## 2015-11-17 ENCOUNTER — Emergency Department: Payer: Managed Care, Other (non HMO)

## 2015-11-17 ENCOUNTER — Observation Stay
Admission: EM | Admit: 2015-11-17 | Discharge: 2015-11-18 | Disposition: A | Discharge status: Home / Self Care | Payer: Managed Care, Other (non HMO) | Attending: Internal Medicine | Admitting: Internal Medicine

## 2015-11-17 ENCOUNTER — Observation Stay
Admit: 2015-11-17 | Discharge: 2015-11-17 | Disposition: A | Discharge status: Home / Self Care | Payer: Managed Care, Other (non HMO) | Attending: Cardiology | Admitting: Cardiology

## 2015-11-17 ENCOUNTER — Encounter: Payer: Self-pay | Admitting: Emergency Medicine

## 2015-11-17 DIAGNOSIS — F909 Attention-deficit hyperactivity disorder, unspecified type: Secondary | ICD-10-CM | POA: Insufficient documentation

## 2015-11-17 DIAGNOSIS — Z79899 Other long term (current) drug therapy: Secondary | ICD-10-CM | POA: Diagnosis not present

## 2015-11-17 DIAGNOSIS — R0789 Other chest pain: Principal | ICD-10-CM | POA: Insufficient documentation

## 2015-11-17 DIAGNOSIS — Z86018 Personal history of other benign neoplasm: Secondary | ICD-10-CM | POA: Insufficient documentation

## 2015-11-17 DIAGNOSIS — G4733 Obstructive sleep apnea (adult) (pediatric): Secondary | ICD-10-CM | POA: Diagnosis not present

## 2015-11-17 DIAGNOSIS — Z888 Allergy status to other drugs, medicaments and biological substances status: Secondary | ICD-10-CM | POA: Insufficient documentation

## 2015-11-17 DIAGNOSIS — Z7951 Long term (current) use of inhaled steroids: Secondary | ICD-10-CM | POA: Insufficient documentation

## 2015-11-17 DIAGNOSIS — R079 Chest pain, unspecified: Secondary | ICD-10-CM | POA: Diagnosis present

## 2015-11-17 DIAGNOSIS — F419 Anxiety disorder, unspecified: Secondary | ICD-10-CM | POA: Insufficient documentation

## 2015-11-17 DIAGNOSIS — Z6841 Body Mass Index (BMI) 40.0 and over, adult: Secondary | ICD-10-CM | POA: Diagnosis not present

## 2015-11-17 DIAGNOSIS — R011 Cardiac murmur, unspecified: Secondary | ICD-10-CM

## 2015-11-17 DIAGNOSIS — J189 Pneumonia, unspecified organism: Secondary | ICD-10-CM | POA: Diagnosis not present

## 2015-11-17 DIAGNOSIS — Z91041 Radiographic dye allergy status: Secondary | ICD-10-CM | POA: Diagnosis not present

## 2015-11-17 DIAGNOSIS — Z87892 Personal history of anaphylaxis: Secondary | ICD-10-CM | POA: Diagnosis not present

## 2015-11-17 DIAGNOSIS — E785 Hyperlipidemia, unspecified: Secondary | ICD-10-CM | POA: Insufficient documentation

## 2015-11-17 DIAGNOSIS — K219 Gastro-esophageal reflux disease without esophagitis: Secondary | ICD-10-CM | POA: Diagnosis not present

## 2015-11-17 DIAGNOSIS — I350 Nonrheumatic aortic (valve) stenosis: Secondary | ICD-10-CM | POA: Diagnosis not present

## 2015-11-17 DIAGNOSIS — J309 Allergic rhinitis, unspecified: Secondary | ICD-10-CM | POA: Diagnosis not present

## 2015-11-17 HISTORY — DX: Cardiac murmur, unspecified: R01.1

## 2015-11-17 LAB — BASIC METABOLIC PANEL
Anion gap: 5 (ref 5–15)
BUN: 16 mg/dL (ref 6–20)
CALCIUM: 9.1 mg/dL (ref 8.9–10.3)
CO2: 27 mmol/L (ref 22–32)
Chloride: 109 mmol/L (ref 101–111)
Creatinine, Ser: 0.97 mg/dL (ref 0.44–1.00)
GFR calc non Af Amer: >60 mL/min (ref >60)
GLUCOSE: 99 mg/dL (ref 65–99)
Potassium: 4 mmol/L (ref 3.5–5.1)
SODIUM: 141 mmol/L (ref 135–145)

## 2015-11-17 LAB — FIBRIN DERIVATIVES D-DIMER (ARMC ONLY): Fibrin derivatives D-dimer (ARMC): 1161 — ABNORMAL HIGH (ref 0–499)

## 2015-11-17 LAB — CBC
HCT: 40.4 % (ref 35.0–47.0)
HEMOGLOBIN: 13.8 g/dL (ref 12.0–16.0)
MCH: 29.3 pg (ref 26.0–34.0)
MCHC: 34.2 g/dL (ref 32.0–36.0)
MCV: 85.7 fL (ref 80.0–100.0)
Platelets: 173 10*3/uL (ref 150–440)
RBC: 4.72 MIL/uL (ref 3.80–5.20)
RDW: 13.7 % (ref 11.5–14.5)
WBC: 6.8 10*3/uL (ref 3.6–11.0)

## 2015-11-17 LAB — TROPONIN I: Troponin I: <0.03 ng/mL (ref <0.031)

## 2015-11-17 MED ORDER — ONDANSETRON HCL 4 MG PO TABS: 4.0000 mg | ORAL_TABLET | Freq: Four times a day (QID) | ORAL | Status: DC | PRN

## 2015-11-17 MED ORDER — METHYLPHENIDATE HCL ER (OSM) 36 MG PO TBCR
36.0000 mg | EXTENDED_RELEASE_TABLET | Freq: Every day | ORAL | Status: DC
  Administered 2015-11-18: 36 mg via ORAL
  Filled 2015-11-17: qty 1

## 2015-11-17 MED ORDER — ACETAMINOPHEN 650 MG RE SUPP: 650.0000 mg | Freq: Four times a day (QID) | RECTAL | Status: DC | PRN

## 2015-11-17 MED ORDER — METAXALONE 800 MG PO TABS
800.0000 mg | ORAL_TABLET | Freq: Every day | ORAL | Status: DC
  Administered 2015-11-18: 800 mg via ORAL
  Filled 2015-11-17 (×2): qty 1

## 2015-11-17 MED ORDER — NITROGLYCERIN 0.4 MG SL SUBL
0.4000 mg | SUBLINGUAL_TABLET | SUBLINGUAL | Status: DC | PRN
  Administered 2015-11-17: 0.4 mg via SUBLINGUAL

## 2015-11-17 MED ORDER — FLUTICASONE PROPIONATE 50 MCG/ACT NA SUSP
1.0000 | Freq: Every day | NASAL | Status: DC
  Filled 2015-11-17: qty 16

## 2015-11-17 MED ORDER — TECHNETIUM TO 99M ALBUMIN AGGREGATED
3.7500 | Freq: Once | INTRAVENOUS | Status: AC | PRN
  Administered 2015-11-17: 3.75 via INTRAVENOUS

## 2015-11-17 MED ORDER — ALPRAZOLAM 0.25 MG PO TABS: 0.2500 mg | ORAL_TABLET | Freq: Two times a day (BID) | ORAL | Status: DC | PRN

## 2015-11-17 MED ORDER — TECHNETIUM TC 99M DIETHYLENETRIAME-PENTAACETIC ACID
31.7700 | Freq: Once | INTRAVENOUS | Status: AC | PRN
  Administered 2015-11-17: 31.77 via INTRAVENOUS

## 2015-11-17 MED ORDER — PANTOPRAZOLE SODIUM 40 MG PO TBEC
40.0000 mg | DELAYED_RELEASE_TABLET | Freq: Every day | ORAL | Status: DC
  Administered 2015-11-18: 40 mg via ORAL
  Filled 2015-11-17: qty 1

## 2015-11-17 MED ORDER — NITROGLYCERIN 0.4 MG SL SUBL: 0.4000 mg | SUBLINGUAL_TABLET | SUBLINGUAL | Status: DC | PRN

## 2015-11-17 MED ORDER — NITROGLYCERIN 2 % TD OINT
0.5000 [in_us] | TOPICAL_OINTMENT | TRANSDERMAL | Status: AC
  Administered 2015-11-17: 0.5 [in_us] via TOPICAL

## 2015-11-17 MED ORDER — DOCUSATE SODIUM 100 MG PO CAPS
100.0000 mg | ORAL_CAPSULE | Freq: Two times a day (BID) | ORAL | Status: DC
  Administered 2015-11-17 – 2015-11-18 (×2): 100 mg via ORAL
  Filled 2015-11-17 (×2): qty 1

## 2015-11-17 MED ORDER — ESCITALOPRAM OXALATE 10 MG PO TABS: 20.0000 mg | ORAL_TABLET | Freq: Every day | ORAL | Status: DC

## 2015-11-17 MED ORDER — ACETAMINOPHEN 500 MG PO TABS
1000.0000 mg | ORAL_TABLET | Freq: Once | ORAL | Status: AC
  Administered 2015-11-17: 1000 mg via ORAL
  Filled 2015-11-17: qty 2

## 2015-11-17 MED ORDER — ESCITALOPRAM OXALATE 10 MG PO TABS
20.0000 mg | ORAL_TABLET | Freq: Every day | ORAL | Status: DC
  Administered 2015-11-17: 20 mg via ORAL
  Filled 2015-11-17: qty 2

## 2015-11-17 MED ORDER — ENOXAPARIN SODIUM 40 MG/0.4ML ~~LOC~~ SOLN
40.0000 mg | Freq: Two times a day (BID) | SUBCUTANEOUS | Status: DC
  Administered 2015-11-17: 40 mg via SUBCUTANEOUS
  Filled 2015-11-17: qty 0.4

## 2015-11-17 MED ORDER — ACETAMINOPHEN 325 MG PO TABS
650.0000 mg | ORAL_TABLET | Freq: Four times a day (QID) | ORAL | Status: DC | PRN
  Administered 2015-11-17 – 2015-11-18 (×2): 650 mg via ORAL
  Filled 2015-11-17 (×2): qty 2

## 2015-11-17 MED ORDER — ASPIRIN EC 325 MG PO TBEC
325.0000 mg | DELAYED_RELEASE_TABLET | Freq: Every day | ORAL | Status: DC
  Administered 2015-11-18: 325 mg via ORAL
  Filled 2015-11-17: qty 1

## 2015-11-17 MED ORDER — MORPHINE SULFATE (PF) 2 MG/ML IV SOLN: 2.0000 mg | INTRAVENOUS | Status: DC | PRN

## 2015-11-17 MED ORDER — ONDANSETRON HCL 4 MG/2ML IJ SOLN: 4.0000 mg | Freq: Four times a day (QID) | INTRAMUSCULAR | Status: DC | PRN

## 2015-11-17 NOTE — ED Notes (Signed)
Patient remains in VQ scan

## 2015-11-17 NOTE — Progress Notes (Signed)
Anticoagulation monitoring(Lovenox):  45 yo  ordered Lovenox 40 mg Q12h  Filed Weights   11/17/15 [id number]  Weight: 250 lb (113.399 kg)   BMI 45.8 Lab Results  Component Value Date   CREATININE 0.97 11/17/2015   CREATININE 0.93 06/09/2015   CREATININE 0.99 03/17/2015   Estimated Creatinine Clearance: 87.2 mL/min (by C-G formula based on Cr of 0.97). Hemoglobin & Hematocrit     Component Value Date/Time   HGB 13.8 11/17/2015 0609   HCT 40.4 11/17/2015 0609     Per Protocol for Patient with est Crcl > 30 ml/min and BMI > 40, will transition to Lovenox 40 mg Q12h.

## 2015-11-17 NOTE — ED Provider Notes (Signed)
[facility] Emergency Department Provider Note  ____________________________________________  Time seen: Approximately 8:56 AM  I have reviewed the triage vital signs and the nursing notes.   HISTORY  Chief Complaint Chest Pain    HPI Natasha Boyd is a 45 y.o. female since for evaluation of chest pressure.  Patient reports she woke up from sleep with chest pain. Described as a pressure and heaviness in the chest, substernal in nature with some slight radiation towards the left arm. She called EMS and was given aspirin, she continues to experience moderate chest pressure. No ripping tearing or moving pain. No fevers chills or recent illness, but does report that yesterday she had some mild shooting chest pains.  No leg swelling, history of blood clots, history of heart disease, smoking. She does report that she has had recent evaluation for an increasing heart murmur and is supposed to see cardiology for this.  Patient does report driving back from [place] recently.   Past Medical History  Diagnosis Date  . Anxiety     Patient Active Problem List   Diagnosis Date Noted  . Chest pain 11/17/2015  . Pneumonia 11/03/2015  . Murmur, cardiac 11/03/2015  . Adult-onset obesity 03/16/2015  . Allergic rhinitis 03/16/2015  . Anxiety 03/16/2015  . Abnormal liver enzymes 03/16/2015  . Acid reflux 03/16/2015  . Blood glucose elevated 03/16/2015  . Hypertriglyceridemia 03/16/2015  . LBP (low back pain) 03/16/2015  . Apnea, sleep 03/16/2015  . Screening-pulmonary TB 03/16/2015  . Immunity status testing 03/16/2015  . Flu vaccine need 03/16/2015  . ADD (attention deficit disorder) 12/15/2014  . Benign neoplasm of skin 02/13/2014    Past Surgical History  Procedure Laterality Date  . Abdominal hysterectomy  2011  . Appendectomy  1996?  Marland Kitchen Cesarean section  2007  . Tonsillectomy  2010    Current Outpatient Rx  Name  Route  Sig  Dispense  Refill  .  escitalopram (LEXAPRO) 20 MG tablet      TAKE 1 TABLET BY MOUTH EVERY DAY   90 tablet   1   . fluticasone (FLONASE) 50 MCG/ACT nasal spray   Nasal   Place into the nose.         . methylphenidate (CONCERTA) 36 MG PO CR tablet   Oral   Take 1 tablet (36 mg total) by mouth daily. May be filled after 12/06/2015   30 tablet   0   . ALPRAZolam (XANAX) 0.25 MG tablet   Oral   Take 0.25 mg by mouth as needed for anxiety.         . metaxalone (SKELAXIN) 800 MG tablet   Oral   Take 800 mg by mouth daily.            Allergies Iodine and Ivp dye  Family History  Problem Relation Age of Onset  . Adopted: Yes  . Family history unknown: Yes    Social History Social History  Substance Use Topics  . Smoking status: Never Smoker   . Smokeless tobacco: Never Used  . Alcohol Use: Yes     Comment: Occasionally    Review of Systems Constitutional: No fever/chills Eyes: No visual changes. ENT: No sore throat. Cardiovascular: See history of present illness Respiratory: Denies shortness of breath. Gastrointestinal: No abdominal pain.  No nausea, no vomiting.  No diarrhea.  No constipation. Genitourinary: Negative for dysuria. Musculoskeletal: Negative for back pain. Skin: Negative for rash. Neurological: Negative for headaches, focal weakness or numbness.  Hysterectomy  10-point ROS otherwise negative.  ____________________________________________   PHYSICAL EXAM:  VITAL SIGNS: ED Triage Vitals  Enc Vitals Group     BP 11/17/15 0607 136/101 mmHg     Pulse Rate 11/17/15 0608 84     Resp 11/17/15 0608 16     Temp 11/17/15 0612 98.1 F (36.7 C)     Temp Source 11/17/15 0612 Oral     SpO2 11/17/15 0606 98 %     Weight 11/17/15 0608 250 lb (113.399 kg)     Height 11/17/15 0608 5\' 2"  (1.575 m)     Head Cir --      Peak Flow --      Pain Score 11/17/15 0847 5     Pain Loc --      Pain Edu? --      Excl. in [place]? --    Constitutional: Alert and oriented. Well  appearing and in no acute distress. Eyes: Conjunctivae are normal. PERRL. EOMI. Head: Atraumatic. Nose: No congestion/rhinnorhea. Mouth/Throat: Mucous membranes are moist.  Oropharynx non-erythematous. Neck: No stridor.   Cardiovascular: Normal rate, regular rhythm. Grossly normal heart sounds except for a moderate proximally 4/6 holosystolic murmur.  Good peripheral circulation. Respiratory: Normal respiratory effort.  No retractions. Lungs CTAB. Gastrointestinal: Soft and nontender. No distention.  Musculoskeletal: No lower extremity tenderness nor edema.  No joint effusions. Neurologic:  Normal speech and language. No gross focal neurologic deficits are appreciated. Skin:  Skin is warm, dry and intact. No rash noted. Psychiatric: Mood and affect are normal. Speech and behavior are normal.  ____________________________________________   LABS (all labs ordered are listed, but only abnormal results are displayed)  Labs Reviewed  FIBRIN DERIVATIVES D-DIMER ([facility] ONLY) - Abnormal; Notable for the following:    Fibrin derivatives D-dimer ([facility]) 1161 (*)    All other components within normal limits  BASIC METABOLIC PANEL  CBC  TROPONIN I  TROPONIN I   ____________________________________________  EKG  ED ECG REPORT I, QUALE, MARK, the attending physician, personally viewed and interpreted this ECG.  Date: 11/17/2015 EKG Time: 612am Rate: 85 Rhythm: normal sinus rhythm QRS Axis: normal Intervals: normal ST/T Wave abnormalities: normal Conduction Disturbances: none Narrative Interpretation: unremarkable  ____________________________________________  RADIOLOGY  NM Pulmonary Perf and Vent (Final result) Result time: 11/17/15 12:44:06   Final result by Rad Results In Interface (11/17/15 12:44:06)   Narrative:   CLINICAL DATA: Chest pain for several hours  EXAM: NUCLEAR MEDICINE VENTILATION - PERFUSION LUNG SCAN  TECHNIQUE: Ventilation images were obtained in  multiple projections using inhaled aerosol Tc-58m DTPA. Perfusion images were obtained in multiple projections after intravenous injection of Tc-75m MAA.  RADIOPHARMACEUTICALS: 31.77 mCi Technetium-42m DTPA aerosol inhalation and 3.75 mCi Technetium-60m MAA IV  COMPARISON: None.  FINDINGS: Ventilation: No focal ventilation defect.  Perfusion: No wedge shaped peripheral perfusion defects to suggest acute pulmonary embolism.  IMPRESSION: No evidence of ventilation perfusion mismatch to suggest pulmonary embolism.   Electronically Signed By: Inez Catalina M.D. On: 11/17/2015 12:44          DG Chest 2 View (Final result) Result time: 11/17/15 07:18:38   Final result by Rad Results In Interface (11/17/15 07:18:38)   Narrative:   CLINICAL DATA: Chest pain.  EXAM: CHEST 2 VIEW  COMPARISON: 10/03/2015.  FINDINGS: Mediastinum and hilar structures are normal. Interval near complete clearing of right base infiltrate. Mild residual subsegmental atelectasis. Lungs are otherwise clear. Heart size stable. No pleural effusion or pneumothorax.  IMPRESSION: Interval near complete clearing  of right base infiltrate. Mild residual subsegmental atelectasis.   Electronically Signed By: Marcello Moores Register On: 11/17/2015 07:18       ____________________________________________   PROCEDURES  Procedure(s) performed: None  Critical Care performed: No  ____________________________________________   INITIAL IMPRESSION / ASSESSMENT AND PLAN / ED COURSE  Pertinent labs & imaging results that were available during my care of the patient were reviewed by me and considered in my medical decision making (see chart for details).  Patient admits for evaluation of substernal chest pressure. EKG and first troponin are negative, he does have an elevated d-dimer which was sent to the setting of low risk criteria however she does have a history of recent long travel  although no clinical evidence suggest obvious DVT by exam. She denies any ripping tearing or moving sensation to suggest dissection. No evidence of pneumothorax, pneumonia, or other acute chest etiology. My primary concern given the patient's substernal chest pressure and radiation, along with a what seemingly seems to be a worsening heart murmur would be that of possible acute coronary syndrome or cardiac etiology. I did discuss the case with the patient's cardiologist Dr. Ubaldo Glassing, and upon consideration of her symptoms, substernal chest pressure with radiation, and seemingly more prominent murmur we will admit the patient for echocardiogram and cardiology consultation as a rule out ACS and other cardiac etiology.  Patient family very agreeable with this plan.  ----------------------------------------- 8:56 AM on 11/17/2015 -----------------------------------------  After a single sublingual nitroglycerin the patient reports complete relief of pain in her chest. We will apply half inch Nitropaste. Patient reported relief with nitrates within minutes of taking.  VQ scan normal. Patient admitted for echo and further cardiac eval. ____________________________________________   FINAL CLINICAL IMPRESSION(S) / ED DIAGNOSES  Final diagnoses:  Chest pain  Systolic ejection murmur  Moderate coronary artery risk chest pain      Delman Kitten, MD 11/17/15 1253

## 2015-11-17 NOTE — ED Notes (Signed)
Pt arrive by EMS with c/o chest pain that started yesterday and went away, pt woke up with substernal chest pain this AM. EMS reports pt has appointment with Dr. Ubaldo Glassing next week due to heart murmer. 324mg  Aspirin given in route. Pt denies pain but states pressure substernal chest location.

## 2015-11-17 NOTE — H&P (Addendum)
[place] [facility] [place] NAME: Natasha Boyd    MR#:  [id number]  DATE OF BIRTH:  Sep 04, 1970  DATE OF ADMISSION:  11/17/2015  PRIMARY CARE PHYSICIAN: Margarita Rana, MD   REQUESTING/REFERRING PHYSICIAN: Quale  CHIEF COMPLAINT:  Chest pain  HISTORY OF PRESENT ILLNESS:  Natasha Boyd  is a 44 y.o. female with a known history of Obesity, GERD, anxiety and allergic rhinitis started having intermittent episodes of chest pain since yesterday night, patient slept well last night but woke up with substernal chest pain this a.m. pain is located in the substernal area and radiating to the left shoulder but not associated with any shortness of breath or heartburn.No similar complaints in the past.patient was referred to cardiologist by her primary care physician for cardiac murmur .ED physician has discussed with on-call cardiologist Dr. Ubaldo Glassing regarding the same and also patient had echocardiogram report is pending. 2 sets of troponins are negative. Patient is reporting calf tenderness and D dimers are elevated .chest pain was resolved after sublingual nitroglycerin was given .patient gets anaphylactic reaction to IVP dyes a VQ scan in the bilateral lower extremity venous Dopplers were ordered which are pending at this time .resting comfortable during my examination without any complaints   PAST MEDICAL HISTORY:   Past Medical History  Diagnosis Date  . Anxiety     PAST SURGICAL HISTOIRY:   Past Surgical History  Procedure Laterality Date  . Abdominal hysterectomy  2011  . Appendectomy  1996?  Marland Kitchen Cesarean section  2007  . Tonsillectomy  2010    SOCIAL HISTORY:   Social History  Substance Use Topics  . Smoking status: Never Smoker   . Smokeless tobacco: Never Used  . Alcohol Use: Yes     Comment: Occasionally    FAMILY HISTORY:   Family History  Problem Relation Age of Onset  . Adopted: Yes  . Family history unknown: Yes    DRUG  ALLERGIES:   Allergies  Allergen Reactions  . Iodine Rash    Anaphylaxis if injected.   Samuel [place] Dye [Iodinated Diagnostic Agents] Anaphylaxis    REVIEW OF SYSTEMS:  CONSTITUTIONAL: No fever, fatigue or weakness.  EYES: No blurred or double vision.  EARS, NOSE, AND THROAT: No tinnitus or ear pain.  RESPIRATORY: No cough, shortness of breath, wheezing or hemoptysis.  CARDIOVASCULAR: Reporting intermittent episodes of substernal  chest pain, denies  orthopnea, edema.  GASTROINTESTINAL: No nausea, vomiting, diarrhea or abdominal pain.  GENITOURINARY: No dysuria, hematuria.  ENDOCRINE: No polyuria, nocturia,  HEMATOLOGY: No anemia, easy bruising or bleeding SKIN: No rash or lesion. MUSCULOSKELETAL: No joint pain or arthritis.   NEUROLOGIC: No tingling, numbness, weakness.  PSYCHIATRY: No anxiety or depression.   MEDICATIONS AT HOME:   Prior to Admission medications   Medication Sig Start Date End Date Taking? Authorizing Provider  escitalopram (LEXAPRO) 20 MG tablet TAKE 1 TABLET BY MOUTH EVERY DAY 11/11/15  Yes Margarita Rana, MD  fluticasone [facility]) 50 MCG/ACT nasal spray Place into the nose. 10/01/14  Yes Historical Provider, MD  methylphenidate (CONCERTA) 36 MG PO CR tablet Take 1 tablet (36 mg total) by mouth daily. May be filled after 12/06/2015 10/06/15  Yes Margarita Rana, MD  ALPRAZolam Duanne Moron) 0.25 MG tablet Take 0.25 mg by mouth as needed for anxiety.    Historical Provider, MD  metaxalone (SKELAXIN) 800 MG tablet Take 800 mg by mouth daily.  08/28/14   Historical Provider, MD  VITAL SIGNS:  Blood pressure 116/75, pulse 82, temperature 98.1 F (36.7 C), temperature source Oral, resp. rate 14, height 5\' 2"  (1.575 m), weight 113.399 kg (250 lb), SpO2 95 %.  PHYSICAL EXAMINATION:  GENERAL:  45 y.o.-year-old patient lying in the bed with no acute distress.  EYES: Pupils equal, round, reactive to light and accommodation. No scleral icterus. Extraocular muscles intact.   HEENT: Head atraumatic, normocephalic. Oropharynx and nasopharynx clear.  NECK:  Supple, no jugular venous distention. No thyroid enlargement, no tenderness.  LUNGS: Normal breath sounds bilaterally, no wheezing, rales,rhonchi or crepitation. No use of accessory muscles of respiration.  CARDIOVASCULAR: S1, S2 normal. 3/6 ejection systolic murmurs, rubs, or gallops.  no reproducible chest   Tenderness on palpation  ABDOMEN: Soft, nontender, nondistended. Bowel sounds present. No organomegaly or mass.  EXTREMITIES: No pedal edema, cyanosis, or clubbing.  NEUROLOGIC: Cranial nerves II through XII are intact. Muscle strength 5/5 in all extremities. Sensation intact. Gait not checked.  PSYCHIATRIC: The patient is alert and oriented x 3.  SKIN: No obvious rash, lesion, or ulcer.   LABORATORY PANEL:   CBC  Recent Labs Lab 11/17/15 0609  WBC 6.8  HGB 13.8  HCT 40.4  PLT 173   ------------------------------------------------------------------------------------------------------------------  Chemistries   Recent Labs Lab 11/17/15 0609  NA 141  K 4.0  CL 109  CO2 27  GLUCOSE 99  BUN 16  CREATININE 0.97  CALCIUM 9.1   ------------------------------------------------------------------------------------------------------------------  Cardiac Enzymes  Recent Labs Lab 11/17/15 0955  TROPONINI <0.03   ------------------------------------------------------------------------------------------------------------------  RADIOLOGY:  Dg Chest 2 View  11/17/2015  CLINICAL DATA:  Chest pain. EXAM: CHEST  2 VIEW COMPARISON:  10/03/2015. FINDINGS: Mediastinum and hilar structures are normal. Interval near complete clearing of right base infiltrate. Mild residual subsegmental atelectasis. Lungs are otherwise clear. Heart size stable. No pleural effusion or pneumothorax. IMPRESSION: Interval near complete clearing of right base infiltrate. Mild residual subsegmental atelectasis. Electronically  Signed   By: Marcello Moores  Register   On: 11/17/2015 07:18    EKG:   Orders placed or performed during the hospital encounter of 11/17/15  . EKG 12-Lead  . EKG 12-Lead  . ED EKG within 10 minutes  . ED EKG within 10 minutes    IMPRESSION AND PLAN:   Natasha Boyd  is a 45 y.o. female with a known history of Obesity, GERD, anxiety and allergic rhinitis started having intermittent episodes of chest pain since yesterday night, patient slept well last night but woke up with substernal chest pain this a.m. pain is located in the substernal area and radiating to the left shoulder but not associated with any shortness of breath or heartburn.No similar complaints in the past.patient was referred to cardiologist by her primary care physician for cardiac murmur .ED physician has discussed with on-call cardiologist Dr. Ubaldo Glassing regarding the same and also patient had echocardiogram report is pending. 2 sets of troponins are negative. Patient is reporting calf tenderness and D dimers are elevated .   # chest pain   Admit patient to telemetry under observation status Cycle cardiac biomarkers. 2 sets of troponins are negative so far. Oxygen, nitroglycerin as needed will be provided. Morphine as needed for pain Echocardiogram was done in the ED results are pending Cardiac consult is placed to Dr. Ubaldo Glassing Her chest pain doesn't seem to be cardiac in nature Stat VQ scan and bilateral lower extremity venous Dopplers are ordered to rule out thromboembolic phenomenon as the patient is reporting chest pain with recent travel  to [place] and calf tenderness with elevated D dimers-pending at this time Fasting lipid panel check in a.m. Check TSH in a.m.   #Anxiety continue her home medication alprazolam as needed. Continue Lexapro  #GERD-provide GI prophylaxis  #Allergic rhinitis-continue Flonase  #Morbid obesity Patient will be benefited with lifestyle changes   once she is medically stable   Will provide GI and  DVT prophylaxis    All the records are reviewed and case discussed with ED provider. Management plans discussed with the patient, mom  and they are in agreement.  CODE STATUS: Full code, husband is the healthcare power of attorney  TOTAL TIME TAKING CARE OF THIS PATIENT: 45 minutes.    Nicholes Mango M.D on 11/17/2015 at 12:20 PM  Between 7am to 6pm - Pager - [phone]  After 6pm go to [email or web address] - password [email or web address] [facility]  [facility]  Office  [phone]  CC: Primary care physician; Margarita Rana, MD

## 2015-11-18 ENCOUNTER — Telehealth: Payer: Self-pay | Admitting: Family Medicine

## 2015-11-18 ENCOUNTER — Encounter: Payer: Self-pay | Admitting: Internal Medicine

## 2015-11-18 LAB — CBC
HCT: 39.9 % (ref 35.0–47.0)
HEMOGLOBIN: 13.4 g/dL (ref 12.0–16.0)
MCH: 29.2 pg (ref 26.0–34.0)
MCHC: 33.6 g/dL (ref 32.0–36.0)
MCV: 86.9 fL (ref 80.0–100.0)
Platelets: 160 10*3/uL (ref 150–440)
RBC: 4.59 MIL/uL (ref 3.80–5.20)
RDW: 13.6 % (ref 11.5–14.5)
WBC: 6.6 10*3/uL (ref 3.6–11.0)

## 2015-11-18 LAB — COMPREHENSIVE METABOLIC PANEL
ALK PHOS: 57 U/L (ref 38–126)
ALT: 45 U/L (ref 14–54)
AST: 32 U/L (ref 15–41)
Albumin: 3.6 g/dL (ref 3.5–5.0)
Anion gap: 5 (ref 5–15)
BUN: 19 mg/dL (ref 6–20)
CALCIUM: 8.6 mg/dL — AB (ref 8.9–10.3)
CHLORIDE: 107 mmol/L (ref 101–111)
CO2: 27 mmol/L (ref 22–32)
CREATININE: 0.91 mg/dL (ref 0.44–1.00)
GFR calc Af Amer: >60 mL/min (ref >60)
Glucose, Bld: 96 mg/dL (ref 65–99)
Potassium: 4.1 mmol/L (ref 3.5–5.1)
Sodium: 139 mmol/L (ref 135–145)
Total Bilirubin: 0.5 mg/dL (ref 0.3–1.2)
Total Protein: 6.5 g/dL (ref 6.5–8.1)

## 2015-11-18 LAB — ECHOCARDIOGRAM COMPLETE
HEIGHTINCHES: 62 in
WEIGHTICAEL: 3953.6 [oz_av]

## 2015-11-18 LAB — LIPID PANEL
CHOLESTEROL: 191 mg/dL (ref 0–200)
HDL: 35 mg/dL — ABNORMAL LOW (ref >40)
LDL CALC: 122 mg/dL — AB (ref 0–99)
Total CHOL/HDL Ratio: 5.5 RATIO
Triglycerides: 171 mg/dL — ABNORMAL HIGH (ref <150)
VLDL: 34 mg/dL (ref 0–40)

## 2015-11-18 LAB — TSH: TSH: 1.778 u[IU]/mL (ref 0.350–4.500)

## 2015-11-18 NOTE — Progress Notes (Signed)
Pt stating that she is having twinges of cp. RN offered nitro, and other pain medication. PT refused stating that she wants " to wait and see how it will do". VSS.  Will continue to monitor.   Iran Sizer M

## 2015-11-18 NOTE — Consult Note (Signed)
Reason for Consult: Chest pain Referring Physician: Margarita Rana primary, Dr. Roselyn Meier Natasha Boyd is an 45 y.o. female.  HPI: Patient presents with a history of obesity GERD anxiety and allergic rhinitis complains of intermittent severe substernal chest pain for 24-48 hours patient slept well the night before but woke up with substernal chest pain mainly located in the midsternal chest radiating to her left shoulder she had no significant shortness of breath. Patient has a history of reflux but denies any worsening symptom patient was found to have a murmur and referred to cardiologist Dr. Ubaldo Glassing. The patient initially was presented to the ER had negative troponins but significant pain D dimers are negative she has a history of dye allergy. Venous Doppler was also obtained symptoms were intermittent recurrent patient states she feels somewhat better now and is waiting for her husband to return from [place], on a business trip.  Past Medical History  Diagnosis Date  . Anxiety     Past Surgical History  Procedure Laterality Date  . Abdominal hysterectomy  2011  . Appendectomy  1996?  Marland Kitchen Cesarean section  2007  . Tonsillectomy  2010    Family History  Problem Relation Age of Onset  . Adopted: Yes  . Family history unknown: Yes    Social History:  reports that she has never smoked. She has never used smokeless tobacco. She reports that she drinks alcohol. She reports that she does not use illicit drugs.  Allergies:  Allergies  Allergen Reactions  . Iodine Rash    Anaphylaxis if injected.   Samuel Germany Dye [Iodinated Diagnostic Agents] Anaphylaxis    Medications: I have reviewed the patient's current medications.  Results for orders placed or performed during the hospital encounter of 11/17/15 (from the past 48 hour(s))  Basic metabolic panel     Status: None   Collection Time: 11/17/15  6:09 AM  Result Value Ref Range   Sodium 141 135 - 145 mmol/L   Potassium 4.0 3.5 - 5.1 mmol/L    Chloride 109 101 - 111 mmol/L   CO2 27 22 - 32 mmol/L   Glucose, Bld 99 65 - 99 mg/dL   BUN 16 6 - 20 mg/dL   Creatinine, Ser 0.97 0.44 - 1.00 mg/dL   Calcium 9.1 8.9 - 10.3 mg/dL   GFR calc non Af Amer >60 >60 mL/min   GFR calc Af Amer >60 >60 mL/min    Comment: (NOTE) The eGFR has been calculated using the CKD EPI equation. This calculation has not been validated in all clinical situations. eGFR's persistently <60 mL/min signify possible Chronic Kidney Disease.    Anion gap 5 5 - 15  CBC     Status: None   Collection Time: 11/17/15  6:09 AM  Result Value Ref Range   WBC 6.8 3.6 - 11.0 K/uL   RBC 4.72 3.80 - 5.20 MIL/uL   Hemoglobin 13.8 12.0 - 16.0 g/dL   HCT 40.4 35.0 - 47.0 %   MCV 85.7 80.0 - 100.0 fL   MCH 29.3 26.0 - 34.0 pg   MCHC 34.2 32.0 - 36.0 g/dL   RDW 13.7 11.5 - 14.5 %   Platelets 173 150 - 440 K/uL  Troponin I     Status: None   Collection Time: 11/17/15  6:09 AM  Result Value Ref Range   Troponin I <0.03 <0.031 ng/mL    Comment:        NO INDICATION OF MYOCARDIAL INJURY.   Fibrin  derivatives D-Dimer ([facility] only)     Status: Abnormal   Collection Time: 11/17/15  6:09 AM  Result Value Ref Range   Fibrin derivatives D-dimer ([facility]) 1161 (H) 0 - 499    Comment: <> Exclusion of Venous Thromboembolism (VTE) - OUTPATIENTS ONLY        (Emergency Department or [place])             0-499 ng/ml (FEU)  : With a low to intermediate pretest                                        probability for VTE this test result                                        excludes the diagnosis of VTE.           > 499 ng/ml (FEU)  : VTE not excluded.  Additional work up                                   for VTE is required.   <>  Testing on Inpatients and Evaluation of Disseminated Intravascular        Coagulation ([facility])             Reference Range:   0-499 ng/ml (FEU)   Troponin I     Status: None   Collection Time: 11/17/15  9:55 AM  Result Value Ref Range   Troponin I <0.03  <0.031 ng/mL    Comment:        NO INDICATION OF MYOCARDIAL INJURY.   Troponin I (q 6hr x 3)     Status: None   Collection Time: 11/17/15  5:30 PM  Result Value Ref Range   Troponin I <0.03 <0.031 ng/mL    Comment:        NO INDICATION OF MYOCARDIAL INJURY.   TSH     Status: None   Collection Time: 11/18/15  4:37 AM  Result Value Ref Range   TSH 1.778 0.350 - 4.500 uIU/mL  CBC     Status: None   Collection Time: 11/18/15  4:37 AM  Result Value Ref Range   WBC 6.6 3.6 - 11.0 K/uL   RBC 4.59 3.80 - 5.20 MIL/uL   Hemoglobin 13.4 12.0 - 16.0 g/dL   HCT 39.9 35.0 - 47.0 %   MCV 86.9 80.0 - 100.0 fL   MCH 29.2 26.0 - 34.0 pg   MCHC 33.6 32.0 - 36.0 g/dL   RDW 13.6 11.5 - 14.5 %   Platelets 160 150 - 440 K/uL  Comprehensive metabolic panel     Status: Abnormal   Collection Time: 11/18/15  4:37 AM  Result Value Ref Range   Sodium 139 135 - 145 mmol/L   Potassium 4.1 3.5 - 5.1 mmol/L   Chloride 107 101 - 111 mmol/L   CO2 27 22 - 32 mmol/L   Glucose, Bld 96 65 - 99 mg/dL   BUN 19 6 - 20 mg/dL   Creatinine, Ser 0.91 0.44 - 1.00 mg/dL   Calcium 8.6 (L) 8.9 - 10.3 mg/dL   Total Protein 6.5 6.5 - 8.1 g/dL   Albumin 3.6 3.5 - 5.0 g/dL  AST 32 15 - 41 U/L   ALT 45 14 - 54 U/L   Alkaline Phosphatase 57 38 - 126 U/L   Total Bilirubin 0.5 0.3 - 1.2 mg/dL   GFR calc non Af Amer >60 >60 mL/min   GFR calc Af Amer >60 >60 mL/min    Comment: (NOTE) The eGFR has been calculated using the CKD EPI equation. This calculation has not been validated in all clinical situations. eGFR's persistently <60 mL/min signify possible Chronic Kidney Disease.    Anion gap 5 5 - 15  Lipid panel     Status: Abnormal   Collection Time: 11/18/15  4:37 AM  Result Value Ref Range   Cholesterol 191 0 - 200 mg/dL   Triglycerides 171 (H) <150 mg/dL   HDL 35 (L) >40 mg/dL   Total CHOL/HDL Ratio 5.5 RATIO   VLDL 34 0 - 40 mg/dL   LDL Cholesterol 122 (H) 0 - 99 mg/dL    Comment:        Total  Cholesterol/HDL:CHD Risk Coronary Heart Disease Risk Table                     Men   Women  1/2 Average Risk   3.4   3.3  Average Risk       5.0   4.4  2 X Average Risk   9.6   7.1  3 X Average Risk  23.4   11.0        Use the calculated Patient Ratio above and the CHD Risk Table to determine the patient's CHD Risk.        ATP III CLASSIFICATION (LDL):  <100     mg/dL   Optimal  100-129  mg/dL   Near or Above                    Optimal  130-159  mg/dL   Borderline  160-189  mg/dL   High  >190     mg/dL   Very High     Dg Chest 2 View  11/17/2015  CLINICAL DATA:  Chest pain. EXAM: CHEST  2 VIEW COMPARISON:  10/03/2015. FINDINGS: Mediastinum and hilar structures are normal. Interval near complete clearing of right base infiltrate. Mild residual subsegmental atelectasis. Lungs are otherwise clear. Heart size stable. No pleural effusion or pneumothorax. IMPRESSION: Interval near complete clearing of right base infiltrate. Mild residual subsegmental atelectasis. Electronically Signed   By: Marcello Moores  Register   On: 11/17/2015 07:18   Nm Pulmonary Perf And Vent  11/17/2015  CLINICAL DATA:  Chest pain for several hours EXAM: NUCLEAR MEDICINE VENTILATION - PERFUSION LUNG SCAN TECHNIQUE: Ventilation images were obtained in multiple projections using inhaled aerosol Tc-102mDTPA. Perfusion images were obtained in multiple projections after intravenous injection of Tc-919mAA. RADIOPHARMACEUTICALS:  31.77 mCi Technetium-9940mPA aerosol inhalation and 3.75 mCi Technetium-89m14m IV COMPARISON:  None. FINDINGS: Ventilation: No focal ventilation defect. Perfusion: No wedge shaped peripheral perfusion defects to suggest acute pulmonary embolism. IMPRESSION: No evidence of ventilation perfusion mismatch to suggest pulmonary embolism. Electronically Signed   By: MarkInez Catalina.   On: 11/17/2015 12:44   Us VKoreaous Img Lower Bilateral  11/17/2015  CLINICAL DATA:  Recent prolonged sitting while traveling;  elevated D-dimer EXAM: BILATERAL LOWER EXTREMITY VENOUS DUPLEX ULTRASOUND TECHNIQUE: Gray-scale sonography with graded compression, as well as color Doppler and duplex ultrasound were performed to evaluate the lower extremity deep venous systems from the level of the  common femoral vein and including the common femoral, femoral, profunda femoral, popliteal and calf veins including the posterior tibial, peroneal and gastrocnemius veins when visible. The superficial great saphenous vein was also interrogated. Spectral Doppler was utilized to evaluate flow at rest and with distal augmentation maneuvers in the common femoral, femoral and popliteal veins. COMPARISON:  None. FINDINGS: RIGHT LOWER EXTREMITY Common Femoral Vein: No evidence of thrombus. Normal compressibility, respiratory phasicity and response to augmentation. Saphenofemoral Junction: No evidence of thrombus. Normal compressibility and flow on color Doppler imaging. Profunda Femoral Vein: No evidence of thrombus. Normal compressibility and flow on color Doppler imaging. Femoral Vein: No evidence of thrombus. Normal compressibility, respiratory phasicity and response to augmentation. Popliteal Vein: No evidence of thrombus. Normal compressibility, respiratory phasicity and response to augmentation. Calf Veins: No evidence of thrombus. Normal compressibility and flow on color Doppler imaging. Superficial Great Saphenous Vein: No evidence of thrombus. Normal compressibility and flow on color Doppler imaging. Venous Reflux:  None. Other Findings:  None. LEFT LOWER EXTREMITY Common Femoral Vein: No evidence of thrombus. Normal compressibility, respiratory phasicity and response to augmentation. Saphenofemoral Junction: No evidence of thrombus. Normal compressibility and flow on color Doppler imaging. Profunda Femoral Vein: No evidence of thrombus. Normal compressibility and flow on color Doppler imaging. Femoral Vein: No evidence of thrombus. Normal  compressibility, respiratory phasicity and response to augmentation. Popliteal Vein: No evidence of thrombus. Normal compressibility, respiratory phasicity and response to augmentation. Calf Veins: No evidence of thrombus. Normal compressibility and flow on color Doppler imaging. Superficial Great Saphenous Vein: No evidence of thrombus. Normal compressibility and flow on color Doppler imaging. Venous Reflux:  None. Other Findings:  None. IMPRESSION: No evidence of deep venous thrombosis in either lower extremity. Electronically Signed   By: Lowella Grip III M.D.   On: 11/17/2015 16:47    Review of Systems  Constitutional: Negative.   HENT: Negative.   Eyes: Negative.   Respiratory: Positive for shortness of breath.   Cardiovascular: Positive for chest pain and palpitations.  Gastrointestinal: Negative.   Genitourinary: Negative.   Musculoskeletal: Negative.   Skin: Negative.   Neurological: Positive for dizziness and tingling.  Endo/Heme/Allergies: Negative.   Psychiatric/Behavioral: Positive for depression. The patient is nervous/anxious.    Blood pressure 120/76, pulse 67, temperature 98.2 F (36.8 C), temperature source Oral, resp. rate 16, height _0  (1.575 m), weight 112.084 kg (247 lb 1.6 oz), SpO2 97 %. Physical Exam  Nursing note and vitals reviewed. Constitutional: She is oriented to person, place, and time. She appears well-developed and well-nourished.  HENT:  Head: Normocephalic and atraumatic.  Eyes: Conjunctivae and EOM are normal. Pupils are equal, round, and reactive to light.  Neck: Normal range of motion.  Cardiovascular: Normal rate and regular rhythm.   Respiratory: Effort normal and breath sounds normal.  GI: Soft. Bowel sounds are normal.  Musculoskeletal: Normal range of motion.  Neurological: She is alert and oriented to person, place, and time. She has normal reflexes.  Skin: Skin is warm.  Psychiatric: Her speech is normal and behavior is normal.  Thought content normal. Her mood appears anxious. Cognition and memory are normal.    Assessment/Plan: Chest pain Obesity Anxiety Murmur Pneumonia recently GERD . PLAN Agree with admit to telemetry Rule out for myocardial infarction Follow-up EKGs Do not recommend any further studies at this point is for acute coronary syndrome is low Consider follow-up as an outpatient with primary cardiologist functional study and maybe an echocardiogram Continue medical therapy Continue supportive  care I believe this is mostly anxiety  CALLWOOD,DWAYNE D. 11/18/2015, 9:01 AM

## 2015-11-18 NOTE — Discharge Summary (Signed)
[place] [facility] [place] NAME: Natasha Boyd    MR#:  [id number]  DATE OF BIRTH:  1970/12/31  DATE OF ADMISSION:  11/17/2015 ADMITTING PHYSICIAN: Nicholes Mango, MD  DATE OF DISCHARGE: 11/18/2015  PRIMARY CARE PHYSICIAN: Margarita Rana, MD    ADMISSION DIAGNOSIS:  Systolic ejection murmur 123XX123 Moderate coronary artery risk chest pain [R07.9] Chest pain [R07.9] Chest pain, unspecified chest pain type [R07.9]  DISCHARGE DIAGNOSIS:  Active Problems:   Chest pain   SECONDARY DIAGNOSIS:   Past Medical History  Diagnosis Date  . Anxiety   heart murmur  HOSPITAL COURSE:   45 year old female with a history of mild aortic murmur and anxiety who presents with chest pain. Further details please refer the H&P.  1. Atypical chest pain: Patient was evaluated for ACS. Troponins and EKG were essentially negative. Patient was evaluated by cardiology who did not recommend further cardiac workup due to her low risk for ACS. She was without chest pain during hospital stay.  2. Known heart Murmur: Echocardiogram shows Normal LVF  Normal Wall Motion  EF=60-65%  Mild/mod sclerosis  Mild/Mod AS  Mild AI.  3. Anxiety: Patient may resume outpatient medications  DISCHARGE CONDITIONS AND DIET:   Stable for discharge on regular diet  CONSULTS OBTAINED:  Treatment Team:  Yolonda Kida, MD  DRUG ALLERGIES:   Allergies  Allergen Reactions  . Iodine Rash    Anaphylaxis if injected.   Clementeen Hoof [Iodinated Diagnostic Agents] Anaphylaxis    DISCHARGE MEDICATIONS:   Current Discharge Medication List    CONTINUE these medications which have NOT CHANGED   Details  escitalopram (LEXAPRO) 20 MG tablet TAKE 1 TABLET BY MOUTH EVERY DAY Qty: 90 tablet, Refills: 1   Associated Diagnoses: Anxiety    fluticasone (FLONASE) 50 MCG/ACT nasal spray Place into the nose.    methylphenidate (CONCERTA) 36 MG PO CR tablet Take 1 tablet (36 mg total) by  mouth daily. May be filled after 12/06/2015 Qty: 30 tablet, Refills: 0   Associated Diagnoses: ADD (attention deficit disorder)    ALPRAZolam (XANAX) 0.25 MG tablet Take 0.25 mg by mouth as needed for anxiety.      STOP taking these medications     metaxalone (SKELAXIN) 800 MG tablet               Today   CHIEF COMPLAINT:  Patient doing well this morning. No chest pain since hospital stay.   VITAL SIGNS:  Blood pressure 137/84, pulse 63, temperature 98.2 F (36.8 C), temperature source Oral, resp. rate 15, height 5\' 2"  (1.575 m), weight 112.084 kg (247 lb 1.6 oz), SpO2 99 %.   REVIEW OF SYSTEMS:  Review of Systems  Constitutional: Negative for fever, chills and malaise/fatigue.  HENT: Negative for ear discharge, ear pain, hearing loss, nosebleeds and sore throat.   Eyes: Negative for blurred vision and pain.  Respiratory: Negative for cough, hemoptysis, shortness of breath and wheezing.   Cardiovascular: Negative for chest pain, palpitations and leg swelling.  Gastrointestinal: Negative for nausea, vomiting, abdominal pain, diarrhea and blood in stool.  Genitourinary: Negative for dysuria.  Musculoskeletal: Negative for back pain.  Neurological: Negative for dizziness, tremors, speech change, focal weakness, seizures and headaches.  Endo/Heme/Allergies: Does not bruise/bleed easily.  Psychiatric/Behavioral: Negative for depression, suicidal ideas and hallucinations.     PHYSICAL EXAMINATION:  GENERAL:  45 y.o.-year-old patient lying in the bed with no acute distress.  NECK:  Supple, no jugular venous  distention. No thyroid enlargement, no tenderness.  LUNGS: Normal breath sounds bilaterally, no wheezing, rales,rhonchi  No use of accessory muscles of respiration.  CARDIOVASCULAR: S1, S2 normal. 2/6 Diasttolic  murmurs,NO rubs, or gallops.  ABDOMEN: Soft, non-tender, non-distended. Bowel sounds present. No organomegaly or mass.  EXTREMITIES: No pedal edema,  cyanosis, or clubbing.  PSYCHIATRIC: The patient is alert and oriented x 3.  SKIN: No obvious rash, lesion, or ulcer.   DATA REVIEW:   CBC  Recent Labs Lab 11/18/15 0437  WBC 6.6  HGB 13.4  HCT 39.9  PLT 160    Chemistries   Recent Labs Lab 11/18/15 0437  NA 139  K 4.1  CL 107  CO2 27  GLUCOSE 96  BUN 19  CREATININE 0.91  CALCIUM 8.6*  AST 32  ALT 45  ALKPHOS 57  BILITOT 0.5    Cardiac Enzymes  Recent Labs Lab 11/17/15 0609 11/17/15 0955 11/17/15 1730  TROPONINI <0.03 <0.03 <0.03    Microbiology Results  @[email or web address] @  RADIOLOGY:  Dg Chest 2 View  11/17/2015  CLINICAL DATA:  Chest pain. EXAM: CHEST  2 VIEW COMPARISON:  10/03/2015. FINDINGS: Mediastinum and hilar structures are normal. Interval near complete clearing of right base infiltrate. Mild residual subsegmental atelectasis. Lungs are otherwise clear. Heart size stable. No pleural effusion or pneumothorax. IMPRESSION: Interval near complete clearing of right base infiltrate. Mild residual subsegmental atelectasis. Electronically Signed   By: Marcello Moores  Register   On: 11/17/2015 07:18   Nm Pulmonary Perf And Vent  11/17/2015  CLINICAL DATA:  Chest pain for several hours EXAM: NUCLEAR MEDICINE VENTILATION - PERFUSION LUNG SCAN TECHNIQUE: Ventilation images were obtained in multiple projections using inhaled aerosol Tc-1m DTPA. Perfusion images were obtained in multiple projections after intravenous injection of Tc-8m MAA. RADIOPHARMACEUTICALS:  31.77 mCi Technetium-53m DTPA aerosol inhalation and 3.75 mCi Technetium-60m MAA IV COMPARISON:  None. FINDINGS: Ventilation: No focal ventilation defect. Perfusion: No wedge shaped peripheral perfusion defects to suggest acute pulmonary embolism. IMPRESSION: No evidence of ventilation perfusion mismatch to suggest pulmonary embolism. Electronically Signed   By: Inez Catalina M.D.   On: 11/17/2015 12:44   US Venous Img Lower Bilateral  11/17/2015  CLINICAL DATA:   Recent prolonged sitting while traveling; elevated D-dimer EXAM: BILATERAL LOWER EXTREMITY VENOUS DUPLEX ULTRASOUND TECHNIQUE: Gray-scale sonography with graded compression, as well as color Doppler and duplex ultrasound were performed to evaluate the lower extremity deep venous systems from the level of the common femoral vein and including the common femoral, femoral, profunda femoral, popliteal and calf veins including the posterior tibial, peroneal and gastrocnemius veins when visible. The superficial great saphenous vein was also interrogated. Spectral Doppler was utilized to evaluate flow at rest and with distal augmentation maneuvers in the common femoral, femoral and popliteal veins. COMPARISON:  None. FINDINGS: RIGHT LOWER EXTREMITY Common Femoral Vein: No evidence of thrombus. Normal compressibility, respiratory phasicity and response to augmentation. Saphenofemoral Junction: No evidence of thrombus. Normal compressibility and flow on color Doppler imaging. Profunda Femoral Vein: No evidence of thrombus. Normal compressibility and flow on color Doppler imaging. Femoral Vein: No evidence of thrombus. Normal compressibility, respiratory phasicity and response to augmentation. Popliteal Vein: No evidence of thrombus. Normal compressibility, respiratory phasicity and response to augmentation. Calf Veins: No evidence of thrombus. Normal compressibility and flow on color Doppler imaging. Superficial Great Saphenous Vein: No evidence of thrombus. Normal compressibility and flow on color Doppler imaging. Venous Reflux:  None. Other Findings:  None. LEFT LOWER EXTREMITY Common Femoral Vein:  No evidence of thrombus. Normal compressibility, respiratory phasicity and response to augmentation. Saphenofemoral Junction: No evidence of thrombus. Normal compressibility and flow on color Doppler imaging. Profunda Femoral Vein: No evidence of thrombus. Normal compressibility and flow on color Doppler imaging. Femoral Vein:  No evidence of thrombus. Normal compressibility, respiratory phasicity and response to augmentation. Popliteal Vein: No evidence of thrombus. Normal compressibility, respiratory phasicity and response to augmentation. Calf Veins: No evidence of thrombus. Normal compressibility and flow on color Doppler imaging. Superficial Great Saphenous Vein: No evidence of thrombus. Normal compressibility and flow on color Doppler imaging. Venous Reflux:  None. Other Findings:  None. IMPRESSION: No evidence of deep venous thrombosis in either lower extremity. Electronically Signed   By: Lowella Grip III M.D.   On: 11/17/2015 16:47      Management plans discussed with the patient and she is in agreement. Stable for discharge home  Patient should follow up with Dr Ubaldo Glassing in 1 week   D/w Dr Clayborn Bigness at discharge CODE STATUS:     Code Status Orders        Start     Ordered   11/17/15 1415  Full code   Continuous     11/17/15 1415    Code Status History    Date Active Date Inactive Code Status Order ID Comments User Context   This patient has a current code status but no historical code status.      TOTAL TIME TAKING CARE OF THIS PATIENT: 35 minutes.    Note: This dictation was prepared with Dragon dictation along with smaller phrase technology. Any transcriptional errors that result from this process are unintentional.  Teresina Bugaj M.D on 11/18/2015 at 11:57 AM  Between 7am to 6pm - Pager - [phone] After 6pm go to [email or web address] - password [email or web address] [facility]  Office  [phone]  CC: Primary care physician; Margarita Rana, MD

## 2015-11-18 NOTE — Telephone Encounter (Signed)
Pt is being discharged from [facility] today for chest pain.  I have scheduled an appointment for pt/MW

## 2015-11-18 NOTE — Telephone Encounter (Signed)
[place], CMA

## 2015-11-18 NOTE — Progress Notes (Signed)
Subjective:  Patient denies any further chest pain still has some shortness of breath as well as occasional palpitations she's resting comfortably in bed  Objective:  Vital Signs in the last 24 hours: Temp:  [98 F (36.7 C)-98.5 F (36.9 C)] 98.2 F (36.8 C) (06/01 0348) Pulse Rate:  [63-68] 63 (06/01 1110) Resp:  [15-18] 15 (06/01 1110) BP: (98-137)/(58-84) 137/84 mmHg (06/01 1110) SpO2:  [92 %-99 %] 99 % (06/01 1110) Weight:  [112.084 kg (247 lb 1.6 oz)] 112.084 kg (247 lb 1.6 oz) (05/31 1418)  Intake/Output from previous day: 05/31 0701 - 06/01 0700 In: 120 [P.O.:120] Out: 525 [Urine:525] Intake/Output from this shift: Total I/O In: 240 [P.O.:240] Out: -   Physical Exam: General appearance: appears stated age Neck: no adenopathy, no carotid bruit, no JVD, supple, symmetrical, trachea midline and thyroid not enlarged, symmetric, no tenderness/mass/nodules Lungs: clear to auscultation bilaterally Heart: regular rate and rhythm, S1, S2 normal, no murmur, click, rub or gallop Abdomen: soft, non-tender; bowel sounds normal; no masses,  no organomegaly Extremities: extremities normal, atraumatic, no cyanosis or edema Pulses: 2+ and symmetric Skin: Skin color, texture, turgor normal. No rashes or lesions Neurologic: Alert and oriented X 3, normal strength and tone. Normal symmetric reflexes. Normal coordination and gait  Lab Results:  Recent Labs  11/17/15 0609 11/18/15 0437  WBC 6.8 6.6  HGB 13.8 13.4  PLT 173 160    Recent Labs  11/17/15 0609 11/18/15 0437  NA 141 139  K 4.0 4.1  CL 109 107  CO2 27 27  GLUCOSE 99 96  BUN 16 19  CREATININE 0.97 0.91    Recent Labs  11/17/15 0955 11/17/15 1730  TROPONINI <0.03 <0.03   Hepatic Function Panel  Recent Labs  11/18/15 0437  PROT 6.5  ALBUMIN 3.6  AST 32  ALT 45  ALKPHOS 57  BILITOT 0.5    Recent Labs  11/18/15 0437  CHOL 191   No results for input(s): PROTIME in the last 72  hours.  Imaging: Imaging results have been reviewed  Cardiac Studies:  Assessment/Plan:  Chest Pain Shortness of Breath  Aortic stenosis moderate  Obesity ADHD Anxiety obstructive sleep apnea GERD Hyperlipidemia . PLAN Continue conservative therapy for his stenosis follow-up echo 612 months Recommend medical therapy Recommend reassurance Continue Xanax Lexapro for anxiety DVT prophylaxis Protonix for GERD Weight loss exercise portion control No evidence of DVT encouraged exercise and weight loss Consider sleep study CPAP weight loss Have the patient follow-up with Dr. Ubaldo Glassing as an outpatient next week     Coren Crownover D. 11/18/2015, 2:00 PM

## 2015-11-23 ENCOUNTER — Encounter: Payer: Self-pay | Admitting: Family Medicine

## 2015-11-23 ENCOUNTER — Ambulatory Visit (INDEPENDENT_AMBULATORY_CARE_PROVIDER_SITE_OTHER): Payer: Managed Care, Other (non HMO) | Admitting: Family Medicine

## 2015-11-23 VITALS — BP 122/88 | HR 76 | Temp 97.9°F | Resp 20 | Wt 250.0 lb

## 2015-11-23 DIAGNOSIS — R0602 Shortness of breath: Secondary | ICD-10-CM | POA: Diagnosis not present

## 2015-11-23 DIAGNOSIS — R140 Abdominal distension (gaseous): Secondary | ICD-10-CM

## 2015-11-23 DIAGNOSIS — F419 Anxiety disorder, unspecified: Secondary | ICD-10-CM | POA: Diagnosis not present

## 2015-11-23 DIAGNOSIS — G4730 Sleep apnea, unspecified: Secondary | ICD-10-CM | POA: Diagnosis not present

## 2015-11-23 DIAGNOSIS — K219 Gastro-esophageal reflux disease without esophagitis: Secondary | ICD-10-CM

## 2015-11-23 MED ORDER — RANITIDINE HCL 150 MG PO CAPS: 150.0000 mg | ORAL_CAPSULE | Freq: Two times a day (BID) | ORAL | Status: DC

## 2015-11-23 MED ORDER — ALPRAZOLAM 0.25 MG PO TABS: 0.2500 mg | ORAL_TABLET | Freq: Every evening | ORAL | Status: DC | PRN

## 2015-11-23 MED ORDER — LANSOPRAZOLE 30 MG PO CPDR: 30.0000 mg | DELAYED_RELEASE_CAPSULE | Freq: Every day | ORAL | Status: DC

## 2015-11-23 NOTE — Progress Notes (Signed)
Subjective:    Patient ID: Natasha Boyd, female    DOB: 1970/09/03, 45 y.o.   MRN: [id number]    Follow up Hospitalization  Patient was admitted to [facility] on 11/17/2015 and discharged on 11/18/2015. She was treated for atypical chest pain. Treatment for this included EKG and troponin levels (all negative). She reports this condition is Improved.  ------------------------------------------------------------------------------------    Shortness of Breath This is a new problem. Episode frequency: pt explains the SOB is worse when exahaling, and is becoming more frequent with exertion. The problem has been gradually worsening. Associated symptoms include chest pain ("tightness"), headaches (especially behind right eye; "pounding") and orthopnea. Pertinent negatives include no abdominal pain, claudication, ear pain, fever, hemoptysis, leg pain, leg swelling, neck pain, rhinorrhea, sore throat, sputum production, swollen glands, syncope, vomiting or wheezing. The symptoms are aggravated by any activity and lying flat (as well as being outside). Associated symptoms comments: Does experience presyncope with SOB; also c/o dysphagia, paresthesias of bilateral hands upon waking in the mornings.. The patient has no known risk factors for DVT/PE. She has tried nothing for the symptoms. Her past medical history is significant for pneumonia. (And anxiety.)  Episodes of SOB last for about 10 minutes; pt states she notices sx improve when she sits down, eats, drinks water, does deep breathing exercises etc. Pt concerned because she has noticed a weak pulse. Friend noted that pt was pale prior to episode that happened today, which was caused by walking from car to school. Husband states pt snores, and has noticed apnea "at least one night a week".    Review of Systems  Constitutional: Negative for fever.  HENT: Negative for ear pain, rhinorrhea and sore throat.   Respiratory: Positive for apnea, chest  tightness and shortness of breath. Negative for hemoptysis, sputum production and wheezing.   Cardiovascular: Positive for chest pain ("tightness") and orthopnea. Negative for claudication, leg swelling and syncope.  Gastrointestinal: Positive for abdominal distention. Negative for vomiting and abdominal pain.       Dysphagia is present  Musculoskeletal: Negative for neck pain.  Neurological: Positive for numbness and headaches (especially behind right eye; "pounding").   BP 122/88 mmHg  Pulse 76  Temp(Src) 97.9 F (36.6 C) (Oral)  Resp 20  Wt 250 lb (113.399 kg)  SpO2 99%   Patient Active Problem List   Diagnosis Date Noted  . Chest pain 11/17/2015  . Pneumonia 11/03/2015  . Murmur, cardiac 11/03/2015  . Adult-onset obesity 03/16/2015  . Allergic rhinitis 03/16/2015  . Anxiety 03/16/2015  . Abnormal liver enzymes 03/16/2015  . Acid reflux 03/16/2015  . Blood glucose elevated 03/16/2015  . Hypertriglyceridemia 03/16/2015  . LBP (low back pain) 03/16/2015  . Apnea, sleep 03/16/2015  . Screening-pulmonary TB 03/16/2015  . Immunity status testing 03/16/2015  . Flu vaccine need 03/16/2015  . ADD (attention deficit disorder) 12/15/2014  . Benign neoplasm of skin 02/13/2014   Past Medical History  Diagnosis Date  . Anxiety   . Heart murmur    Current Outpatient Prescriptions on File Prior to Visit  Medication Sig  . escitalopram (LEXAPRO) 20 MG tablet TAKE 1 TABLET BY MOUTH EVERY DAY  . ALPRAZolam (XANAX) 0.25 MG tablet Take 0.25 mg by mouth as needed for anxiety. Reported on 11/23/2015  . fluticasone (FLONASE) 50 MCG/ACT nasal spray Place into the nose. Reported on 11/23/2015  . methylphenidate (CONCERTA) 36 MG PO CR tablet Take 1 tablet (36 mg total) by mouth daily. May be filled after  12/06/2015 (Patient not taking: Reported on 11/23/2015)   No current facility-administered medications on file prior to visit.   Allergies  Allergen Reactions  . Iodine Rash    Anaphylaxis if  injected.   Clementeen Hoof [Iodinated Diagnostic Agents] Anaphylaxis   Past Surgical History  Procedure Laterality Date  . Abdominal hysterectomy  2011  . Appendectomy  1996?  Marland Kitchen Cesarean section  2007  . Tonsillectomy  2010   Social History   Social History  . Marital Status: Married    Spouse Name: N/A  . Number of Children: N/A  . Years of Education: N/A   Occupational History  . Not on file.   Social History Main Topics  . Smoking status: Never Smoker   . Smokeless tobacco: Never Used  . Alcohol Use: Yes     Comment: Occasionally  . Drug Use: No  . Sexual Activity: Not on file   Other Topics Concern  . Not on file   Social History Narrative   Family History  Problem Relation Age of Onset  . Adopted: Yes  . Family history unknown: Yes       Objective:   Physical Exam  Constitutional: She appears well-developed and well-nourished.  Cardiovascular: Normal rate, regular rhythm and normal heart sounds.   Pulmonary/Chest: Effort normal and breath sounds normal. No respiratory distress. She has no wheezes.  Abdominal: She exhibits distension.  Psychiatric: She has a normal mood and affect. Her behavior is normal.       Assessment & Plan:  1. Shortness of breath Worsening.  EKG and spirometry normal.   - EKG 12-Lead - Spirometry with Graph  2. Gastroesophageal reflux disease, esophagitis presence not specified Recurrent problem. Suspect part of problem. Will restart medication.   - lansoprazole (PREVACID) 30 MG capsule; Take 1 capsule (30 mg total) by mouth daily at 12 noon.  Dispense: 30 capsule; Refill: 5 - ranitidine (ZANTAC) 150 MG capsule; Take 1 capsule (150 mg total) by mouth 2 (two) times daily.  Dispense: 60 capsule; Refill: 5 - Ambulatory referral to Gastroenterology  3. Anxiety Worsening problem. Take as needed.  - ALPRAZolam (XANAX) 0.25 MG tablet; Take 1 tablet (0.25 mg total) by mouth at bedtime as needed for anxiety. Reported on 11/23/2015   Dispense: 30 tablet; Refill: 5  4. Apnea, sleep Equivocal study several years ago. Will recheck .  - Home sleep test; Future  5. Abdominal bloating Worsening.  Will refer to evaluate and treat.   - Ambulatory referral to Gastroenterology    Patient seen and examined by Jerrell Belfast, MD, and note scribed by Renaldo Fiddler, CMA.   I have reviewed the document for accuracy and completeness and I agree with above. Jerrell Belfast, MD   Margarita Rana, MD

## 2015-11-26 ENCOUNTER — Inpatient Hospital Stay: Payer: Managed Care, Other (non HMO) | Admitting: Family Medicine

## 2015-12-01 ENCOUNTER — Telehealth: Payer: Self-pay | Admitting: Family Medicine

## 2015-12-01 NOTE — Telephone Encounter (Signed)
Order for home sleep study faxed to [employer]

## 2015-12-22 ENCOUNTER — Ambulatory Visit: Payer: Managed Care, Other (non HMO) | Admitting: Family Medicine

## 2015-12-29 ENCOUNTER — Ambulatory Visit: Payer: Managed Care, Other (non HMO) | Admitting: Family Medicine

## 2016-01-20 ENCOUNTER — Other Ambulatory Visit: Payer: Self-pay | Admitting: Student

## 2016-01-20 DIAGNOSIS — IMO0001 Reserved for inherently not codable concepts without codable children: Secondary | ICD-10-CM

## 2016-01-20 DIAGNOSIS — R1084 Generalized abdominal pain: Secondary | ICD-10-CM

## 2016-01-25 ENCOUNTER — Ambulatory Visit: Admission: RE | Admit: 2016-01-25 | Payer: Managed Care, Other (non HMO) | Source: Ambulatory Visit

## 2016-01-25 ENCOUNTER — Ambulatory Visit
Admission: RE | Admit: 2016-01-25 | Discharge: 2016-01-25 | Disposition: A | Discharge status: Home / Self Care | Payer: Managed Care, Other (non HMO) | Source: Ambulatory Visit | Attending: Student | Admitting: Student

## 2016-01-25 DIAGNOSIS — R1084 Generalized abdominal pain: Secondary | ICD-10-CM | POA: Diagnosis present

## 2016-01-25 DIAGNOSIS — K760 Fatty (change of) liver, not elsewhere classified: Secondary | ICD-10-CM | POA: Diagnosis not present

## 2016-01-25 DIAGNOSIS — IMO0001 Reserved for inherently not codable concepts without codable children: Secondary | ICD-10-CM

## 2016-01-25 DIAGNOSIS — R140 Abdominal distension (gaseous): Secondary | ICD-10-CM | POA: Diagnosis present

## 2016-03-03 ENCOUNTER — Encounter: Payer: Self-pay | Admitting: *Deleted

## 2016-03-03 ENCOUNTER — Encounter
Admission: RE | Disposition: A | Discharge status: Home / Self Care | Payer: Self-pay | Source: Ambulatory Visit | Attending: Unknown Physician Specialty

## 2016-03-03 ENCOUNTER — Ambulatory Visit
Admission: RE | Admit: 2016-03-03 | Discharge: 2016-03-03 | Disposition: A | Discharge status: Home / Self Care | Payer: Managed Care, Other (non HMO) | Source: Ambulatory Visit | Attending: Unknown Physician Specialty | Admitting: Unknown Physician Specialty

## 2016-03-03 ENCOUNTER — Ambulatory Visit: Payer: Managed Care, Other (non HMO) | Admitting: Anesthesiology

## 2016-03-03 DIAGNOSIS — R1310 Dysphagia, unspecified: Secondary | ICD-10-CM | POA: Insufficient documentation

## 2016-03-03 DIAGNOSIS — Z79899 Other long term (current) drug therapy: Secondary | ICD-10-CM | POA: Diagnosis not present

## 2016-03-03 DIAGNOSIS — E669 Obesity, unspecified: Secondary | ICD-10-CM | POA: Diagnosis not present

## 2016-03-03 DIAGNOSIS — Z6841 Body Mass Index (BMI) 40.0 and over, adult: Secondary | ICD-10-CM | POA: Insufficient documentation

## 2016-03-03 DIAGNOSIS — K2950 Unspecified chronic gastritis without bleeding: Secondary | ICD-10-CM | POA: Diagnosis not present

## 2016-03-03 DIAGNOSIS — F419 Anxiety disorder, unspecified: Secondary | ICD-10-CM | POA: Insufficient documentation

## 2016-03-03 DIAGNOSIS — K210 Gastro-esophageal reflux disease with esophagitis: Secondary | ICD-10-CM | POA: Insufficient documentation

## 2016-03-03 DIAGNOSIS — J309 Allergic rhinitis, unspecified: Secondary | ICD-10-CM | POA: Insufficient documentation

## 2016-03-03 HISTORY — DX: Obesity, unspecified: E66.9

## 2016-03-03 HISTORY — DX: Gastro-esophageal reflux disease without esophagitis: K21.9

## 2016-03-03 HISTORY — DX: Other benign neoplasm of skin, unspecified: D23.9

## 2016-03-03 HISTORY — DX: Polydipsia: R63.1

## 2016-03-03 HISTORY — DX: Angina pectoris, unspecified: I20.9

## 2016-03-03 HISTORY — DX: Unspecified abdominal pain: R10.9

## 2016-03-03 HISTORY — PX: ESOPHAGOGASTRODUODENOSCOPY (EGD) WITH PROPOFOL: SHX5813

## 2016-03-03 HISTORY — DX: Allergic rhinitis, unspecified: J30.9

## 2016-03-03 HISTORY — DX: Dyspnea, unspecified: R06.00

## 2016-03-03 LAB — SURGICAL PATHOLOGY

## 2016-03-03 SURGERY — ESOPHAGOGASTRODUODENOSCOPY (EGD) WITH PROPOFOL
Anesthesia: General

## 2016-03-03 MED ORDER — SODIUM CHLORIDE 0.9 % IV SOLN
INTRAVENOUS | Status: DC
  Administered 2016-03-03: 09:00:00 via INTRAVENOUS

## 2016-03-03 MED ORDER — PROPOFOL 10 MG/ML IV BOLUS
INTRAVENOUS | Status: DC | PRN
  Administered 2016-03-03: 50 mg via INTRAVENOUS
  Administered 2016-03-03: 30 mg via INTRAVENOUS
  Administered 2016-03-03: 50 mg via INTRAVENOUS
  Administered 2016-03-03 (×2): 30 mg via INTRAVENOUS
  Administered 2016-03-03: 50 mg via INTRAVENOUS
  Administered 2016-03-03: 30 mg via INTRAVENOUS

## 2016-03-03 MED ORDER — SODIUM CHLORIDE 0.9 % IV SOLN
INTRAVENOUS | Status: DC
  Administered 2016-03-03: 1000 mL via INTRAVENOUS

## 2016-03-03 MED ORDER — GLYCOPYRROLATE 0.2 MG/ML IJ SOLN
INTRAMUSCULAR | Status: DC | PRN
  Administered 2016-03-03: 0.2 mg via INTRAVENOUS

## 2016-03-03 NOTE — Anesthesia Postprocedure Evaluation (Signed)
Anesthesia Post Note  Patient: Natasha Boyd  Procedure(s) Performed: Procedure(s) (LRB): ESOPHAGOGASTRODUODENOSCOPY (EGD) WITH PROPOFOL (N/A)  Patient location during evaluation: Endoscopy Anesthesia Type: General Level of consciousness: awake and alert Pain management: pain level controlled Vital Signs Assessment: post-procedure vital signs reviewed and stable Respiratory status: spontaneous breathing, nonlabored ventilation, respiratory function stable and patient connected to nasal cannula oxygen Cardiovascular status: blood pressure returned to baseline and stable Postop Assessment: no signs of nausea or vomiting Anesthetic complications: no    Last Vitals:  Vitals:   03/03/16 0940 03/03/16 0950  BP: (!) 125/93   Pulse: 65 69  Resp: 14 13  Temp:      Last Pain:  Vitals:   03/03/16 0910  TempSrc: Tympanic  PainSc: Asleep                 Martha Clan

## 2016-03-03 NOTE — Transfer of Care (Signed)
Immediate Anesthesia Transfer of Care Note  Patient: Natasha Boyd  Procedure(s) Performed: Procedure(s): ESOPHAGOGASTRODUODENOSCOPY (EGD) WITH PROPOFOL (N/A)  Patient Location: PACU  Anesthesia Type:General  Level of Consciousness: unresponsive  Airway & Oxygen Therapy: Patient Spontanous Breathing  Post-op Assessment: Report given to RN and Post -op Vital signs reviewed and stable  Post vital signs: Reviewed and stable  Last Vitals:  Vitals:   03/03/16 0824 03/03/16 0910  BP: 127/87 108/78  Pulse: 72 80  Resp: 18 15  Temp: 36.5 C (!) 36.1 C    Last Pain:  Vitals:   03/03/16 0910  TempSrc: Tympanic  PainSc: Asleep         Complications: No apparent anesthesia complications

## 2016-03-03 NOTE — H&P (Signed)
Primary Care Physician:  Wilhemena Durie, MD Primary Gastroenterologist:  Dr. Vira Agar  Pre-Procedure History & Physical: HPI:  Natasha Boyd is a 45 y.o. female is here for an endoscopy.   Past Medical History:  Diagnosis Date  . Abdominal pain   . Allergic rhinitis   . Anginal pain ([place])   . Anxiety   . Benign neoplasm of skin   . Dyspnea   . GERD (gastroesophageal reflux disease)   . Heart murmur   . Obesity   . Polydipsia     Past Surgical History:  Procedure Laterality Date  . ABDOMINAL HYSTERECTOMY  2011  . APPENDECTOMY  1996?  . CESAREAN SECTION  2007  . TONSILLECTOMY  2010    Prior to Admission medications   Medication Sig Start Date End Date Taking? Authorizing Provider  ALPRAZolam (XANAX) 0.25 MG tablet Take 1 tablet (0.25 mg total) by mouth at bedtime as needed for anxiety. Reported on 11/23/2015 11/23/15  Yes Margarita Rana, MD  escitalopram (LEXAPRO) 20 MG tablet TAKE 1 TABLET BY MOUTH EVERY DAY 11/11/15  Yes Margarita Rana, MD  fluticasone [facility]) 50 MCG/ACT nasal spray Place into the nose. Reported on 11/23/2015 10/01/14  Yes Historical Provider, MD  lansoprazole (PREVACID) 30 MG capsule Take 1 capsule (30 mg total) by mouth daily at 12 noon. 11/23/15  Yes Margarita Rana, MD  methylphenidate (CONCERTA) 36 MG PO CR tablet Take 1 tablet (36 mg total) by mouth daily. May be filled after 12/06/2015 10/06/15  Yes Margarita Rana, MD  ranitidine (ZANTAC) 150 MG capsule Take 1 capsule (150 mg total) by mouth 2 (two) times daily. 11/23/15  Yes Margarita Rana, MD  metoprolol tartrate (LOPRESSOR) 25 MG tablet Take 12.5 mg by mouth 2 (two) times daily.    Historical Provider, MD    Allergies as of 02/24/2016 - Review Complete 11/23/2015  Allergen Reaction Noted  . Iodine Rash 05/18/2010  . Ivp dye [iodinated diagnostic agents] Anaphylaxis 11/17/2015    Family History  Problem Relation Age of Onset  . Adopted: Yes  . Family history unknown: Yes    Social History    Social History  . Marital status: Married    Spouse name: N/A  . Number of children: N/A  . Years of education: N/A   Occupational History  . Not on file.   Social History Main Topics  . Smoking status: Never Smoker  . Smokeless tobacco: Never Used  . Alcohol use Yes     Comment: Occasionally  . Drug use: No  . Sexual activity: Not on file   Other Topics Concern  . Not on file   Social History Narrative  . No narrative on file    Review of Systems: See HPI, otherwise negative ROS  Physical Exam: BP 127/87   Pulse 72   Temp 97.7 F (36.5 C) (Tympanic)   Resp 18   Ht 5\' 6"  (1.676 m)   Wt 113.4 kg (250 lb)   BMI 40.35 kg/m  General:   Alert,  pleasant and cooperative in NAD Head:  Normocephalic and atraumatic. Neck:  Supple; no masses or thyromegaly. Lungs:  Clear throughout to auscultation.    Heart:  Regular rate and rhythm. Abdomen:  Soft, nontender and nondistended. Normal bowel sounds, without guarding, and without rebound.   Neurologic:  Alert and  oriented x4;  grossly normal neurologically.  Impression/Plan: Natasha Boyd is here for an endoscopy to be performed for dysphagia.  Risks, benefits, limitations, and alternatives  regarding  endoscopy have been reviewed with the patient.  Questions have been answered.  All parties agreeable.   Gaylyn Cheers, MD  03/03/2016, 8:50 AM

## 2016-03-03 NOTE — Op Note (Signed)
[facility] Patient Name: Natasha Boyd Procedure Date: 03/03/2016 8:52 AM MRN: [id number] Account #: [id number] Date of Birth: 1971-01-10 Admit Type: Outpatient Age: 45 Room: [facility] ENDO ROOM 1 Gender: Female Note Status: Finalized Procedure:            Upper GI endoscopy Indications:          Dysphagia, Heartburn Providers:            Manya Silvas, MD Referring MD:         Janine Ores. Rosanna Randy, MD (Referring MD) Medicines:            Propofol per Anesthesia Complications:        No immediate complications. Procedure:            Pre-Anesthesia Assessment:                       - After reviewing the risks and benefits, the patient                        was deemed in satisfactory condition to undergo the                        procedure.                       After obtaining informed consent, the endoscope was                        passed under direct vision. Throughout the procedure,                        the patient's blood pressure, pulse, and oxygen                        saturations were monitored continuously. The Endoscope                        was introduced through the mouth, and advanced to the                        second part of duodenum. The upper GI endoscopy was                        accomplished without difficulty. The patient tolerated                        the procedure well. Findings:      LA Grade A (one or more mucosal breaks less than 5 mm, not extending       between tops of 2 mucosal folds) esophagitis with no bleeding was found       39 cm from the incisors. Biopsies were taken with a cold forceps for       histology. At the end of the procedure I passed a guide wire into the       duodenum and withdrew the scope and passed a 20F Savary dilator over the       wire with minimal resistance.      Patchy mildly erythematous mucosa without bleeding was found in the       gastric body. Biopsies were taken with a cold  forceps for histology.  Patchy mildly erythematous mucosa without active bleeding and with no       stigmata of bleeding was found in the duodenal bulb. Impression:           - LA Grade A reflux esophagitis. Rule out Barrett's                        esophagus. Biopsied.                       - Erythematous mucosa in the gastric body. Biopsied.                       - Normal examined duodenum. Recommendation:       - Await pathology results.                       - soft food for 3 days, eat slowly, chew well, take                        small bites                       Take medicine for acid reflux. Manya Silvas, MD 03/03/2016 9:12:23 AM This report has been signed electronically. Number of Addenda: 0 Note Initiated On: 03/03/2016 8:52 AM      [facility]

## 2016-03-03 NOTE — Anesthesia Preprocedure Evaluation (Signed)
Anesthesia Evaluation  Patient identified by MRN, date of birth, ID band Patient awake    Reviewed: Allergy & Precautions, H&P , NPO status , Patient's Chart, lab work & pertinent test results, reviewed documented beta blocker date and time   History of Anesthesia Complications Negative for: history of anesthetic complications  Airway Mallampati: II  TM Distance: >3 FB Neck ROM: full    Dental no notable dental hx.    Pulmonary neg shortness of breath, sleep apnea , neg COPD, neg recent URI,    Pulmonary exam normal breath sounds clear to auscultation       Cardiovascular Exercise Tolerance: Good (-) hypertension(-) angina(-) CAD, (-) Past MI, (-) Cardiac Stents and (-) CABG Normal cardiovascular exam(-) dysrhythmias + Valvular Problems/Murmurs MVP  Rhythm:regular Rate:Normal     Neuro/Psych PSYCHIATRIC DISORDERS (ADD) negative neurological ROS     GI/Hepatic GERD  ,NAFLD   Endo/Other  neg diabetesMorbid obesity  Renal/GU negative Renal ROS  negative genitourinary   Musculoskeletal   Abdominal   Peds  Hematology negative hematology ROS (+)   Anesthesia Other Findings Past Medical History: No date: Abdominal pain No date: Allergic rhinitis No date: Anginal pain ([place]) No date: Anxiety No date: Benign neoplasm of skin No date: Dyspnea No date: GERD (gastroesophageal reflux disease) No date: Heart murmur No date: Obesity No date: Polydipsia   Reproductive/Obstetrics negative OB ROS                             Anesthesia Physical Anesthesia Plan  ASA: III  Anesthesia Plan: General   Post-op Pain Management:    Induction:   Airway Management Planned:   Additional Equipment:   Intra-op Plan:   Post-operative Plan:   Informed Consent: I have reviewed the patients History and Physical, chart, labs and discussed the procedure including the risks, benefits and alternatives for  the proposed anesthesia with the patient or authorized representative who has indicated his/her understanding and acceptance.   Dental Advisory Given  Plan Discussed with: Anesthesiologist, CRNA and Surgeon  Anesthesia Plan Comments:         Anesthesia Quick Evaluation

## 2016-03-06 ENCOUNTER — Encounter: Payer: Self-pay | Admitting: Unknown Physician Specialty

## 2016-03-31 ENCOUNTER — Other Ambulatory Visit: Payer: Self-pay

## 2016-03-31 DIAGNOSIS — F988 Other specified behavioral and emotional disorders with onset usually occurring in childhood and adolescence: Secondary | ICD-10-CM

## 2016-04-16 ENCOUNTER — Ambulatory Visit
Admission: EM | Admit: 2016-04-16 | Discharge: 2016-04-16 | Disposition: A | Discharge status: Home / Self Care | Payer: Managed Care, Other (non HMO) | Attending: Family Medicine | Admitting: Family Medicine

## 2016-04-16 DIAGNOSIS — J0111 Acute recurrent frontal sinusitis: Secondary | ICD-10-CM | POA: Diagnosis not present

## 2016-04-16 DIAGNOSIS — H6502 Acute serous otitis media, left ear: Secondary | ICD-10-CM

## 2016-04-16 MED ORDER — AMOXICILLIN 500 MG PO CAPS: 500.0000 mg | ORAL_CAPSULE | Freq: Three times a day (TID) | ORAL | 0 refills | Status: DC

## 2016-04-16 NOTE — ED Provider Notes (Signed)
CSN: [id number]     Arrival date & time 04/16/16  1434 History   First MD Initiated Contact with Patient 04/16/16 1523     Chief Complaint  Patient presents with  . Facial Pain   (Consider location/radiation/quality/duration/timing/severity/associated sxs/prior Treatment) C/o headache, sinus pressure, LT ear pain for the past week. Has a hx of chronic sinus infections. Denies any fevers. Has been taking motrin and OTC decongestants with no relief.       Past Medical History:  Diagnosis Date  . Abdominal pain   . Allergic rhinitis   . Anginal pain ([place])   . Anxiety   . Benign neoplasm of skin   . Dyspnea   . GERD (gastroesophageal reflux disease)   . Heart murmur   . Obesity   . Polydipsia    Past Surgical History:  Procedure Laterality Date  . ABDOMINAL HYSTERECTOMY  2011  . APPENDECTOMY  1996?  . CESAREAN SECTION  2007  . ESOPHAGOGASTRODUODENOSCOPY (EGD) WITH PROPOFOL N/A 03/03/2016   Procedure: ESOPHAGOGASTRODUODENOSCOPY (EGD) WITH PROPOFOL;  Surgeon: Manya Silvas, MD;  Location: [facility] ENDOSCOPY;  Service: Endoscopy;  Laterality: N/A;  . TONSILLECTOMY  2010   Family History  Problem Relation Age of Onset  . Adopted: Yes  . Family history unknown: Yes   Social History  Substance Use Topics  . Smoking status: Never Smoker  . Smokeless tobacco: Never Used  . Alcohol use Yes     Comment: Occasionally   OB History    Gravida Para Term Preterm AB Living   2 2       2    SAB TAB Ectopic Multiple Live Births                  Obstetric Comments   1st Menstrual Cycle: 12  1st Pregnancy: 29     Review of Systems  Constitutional: Negative.   HENT: Positive for congestion, ear pain, postnasal drip, rhinorrhea and sinus pressure.   Eyes: Negative.   Respiratory: Positive for cough.   Cardiovascular: Negative.   Skin: Negative.   Neurological: Positive for headaches.       Generalized intemit     Allergies  Iodine and Ivp dye [iodinated diagnostic  agents]  Home Medications   Prior to Admission medications   Medication Sig Start Date End Date Taking? Authorizing Provider  ALPRAZolam (XANAX) 0.25 MG tablet Take 1 tablet (0.25 mg total) by mouth at bedtime as needed for anxiety. Reported on 11/23/2015 11/23/15  Yes Margarita Rana, MD  escitalopram (LEXAPRO) 20 MG tablet TAKE 1 TABLET BY MOUTH EVERY DAY 11/11/15  Yes Margarita Rana, MD  fluticasone [facility]) 50 MCG/ACT nasal spray Place into the nose. Reported on 11/23/2015 10/01/14  Yes Historical Provider, MD  amoxicillin (AMOXIL) 500 MG capsule Take 1 capsule (500 mg total) by mouth 3 (three) times daily. 04/16/16   Melanee Left, NP  lansoprazole (PREVACID) 30 MG capsule Take 1 capsule (30 mg total) by mouth daily at 12 noon. 11/23/15   Margarita Rana, MD  methylphenidate (CONCERTA) 36 MG PO CR tablet Take 1 tablet (36 mg total) by mouth daily. May be filled after 12/06/2015 10/06/15   Margarita Rana, MD  metoprolol tartrate (LOPRESSOR) 25 MG tablet Take 12.5 mg by mouth 2 (two) times daily.    Historical Provider, MD  ranitidine (ZANTAC) 150 MG capsule Take 1 capsule (150 mg total) by mouth 2 (two) times daily. 11/23/15   Margarita Rana, MD   Meds Ordered and Administered this  Visit  Medications - No data to display  BP 112/77 (BP Location: Left Arm)   Pulse 82   Temp 98.5 F (36.9 C) (Tympanic)   Resp 17   Ht 5\' 6"  (1.676 m)   Wt 250 lb (113.4 kg)   SpO2 99%   BMI 40.35 kg/m  No data found.   Physical Exam  Constitutional: She is oriented to person, place, and time. She appears well-developed.  HENT:  bulging TM bil, erythema to LT TM pain on palpation, sinus pressure and pain on palpation clear drainage,   Eyes: Pupils are equal, round, and reactive to light.  Neck: Normal range of motion.  Cardiovascular: Regular rhythm.   Pulmonary/Chest: Effort normal and breath sounds normal.  Neurological: She is alert and oriented to person, place, and time.  Skin: Skin is warm.     Urgent Care Course   Clinical Course    Procedures (including critical care time)  Labs Review Labs Reviewed - No data to display  Imaging Review No results found.           MDM   1. Acute serous otitis media of left ear, recurrence not specified   2. Acute recurrent frontal sinusitis    May use humidifier and saline spray  Take full dose of abx May need to follow up with PCP if sx persist Cough may persist for 2 weeks post treatment can use cough drops or deslym as needed  Avoid DM products due it can elevate bp.      Melanee Left, NP 04/16/16 1536

## 2016-04-16 NOTE — ED Triage Notes (Signed)
Patient complains of sinus pain and pressure, ear pain and pressure. Patient states that she has also been having a headache. Patient denies any cough.

## 2016-04-19 ENCOUNTER — Telehealth: Payer: Self-pay

## 2016-04-19 NOTE — Telephone Encounter (Signed)
Courtesy call back completed today for patient's recent visit at [facility]. Patient did not answer, left message on machine to call back with any questions or concerns.   

## 2016-05-04 ENCOUNTER — Other Ambulatory Visit: Payer: Self-pay | Admitting: Family Medicine

## 2016-05-04 DIAGNOSIS — F419 Anxiety disorder, unspecified: Secondary | ICD-10-CM

## 2016-05-24 ENCOUNTER — Other Ambulatory Visit: Payer: Self-pay | Admitting: Family Medicine

## 2016-05-24 DIAGNOSIS — F419 Anxiety disorder, unspecified: Secondary | ICD-10-CM

## 2016-05-24 NOTE — Telephone Encounter (Signed)
Please review-aa 

## 2016-05-25 NOTE — Telephone Encounter (Signed)
Needs appt for further refills.

## 2016-06-28 ENCOUNTER — Other Ambulatory Visit: Payer: Self-pay | Admitting: Family Medicine

## 2016-06-28 DIAGNOSIS — F419 Anxiety disorder, unspecified: Secondary | ICD-10-CM

## 2016-06-28 NOTE — Telephone Encounter (Signed)
Use to be dr Natasha Boyd patient, looks like she was assigned to you. Appointments have been made but have been cancelled so far-aa

## 2016-07-16 ENCOUNTER — Other Ambulatory Visit: Payer: Self-pay | Admitting: Family Medicine

## 2016-07-16 DIAGNOSIS — F419 Anxiety disorder, unspecified: Secondary | ICD-10-CM

## 2016-08-04 ENCOUNTER — Other Ambulatory Visit: Payer: Self-pay

## 2016-08-04 DIAGNOSIS — F419 Anxiety disorder, unspecified: Secondary | ICD-10-CM

## 2016-08-09 ENCOUNTER — Other Ambulatory Visit: Payer: Self-pay | Admitting: Family Medicine

## 2016-08-09 DIAGNOSIS — F419 Anxiety disorder, unspecified: Secondary | ICD-10-CM

## 2016-08-09 NOTE — Telephone Encounter (Signed)
Pt was scheduled for OV on 08/10/16 but we had to reschedule the appt to 08/15/16 b/c provider is out of the office. Pt wanted to see if she could get a refill on the following medications: 1. escitalopram (LEXAPRO) 20 MG tablet  2. ALPRAZolam (XANAX) 0.25 MG tablet [facility] [place]. Please advise. Thanks TNP

## 2016-08-09 NOTE — Telephone Encounter (Signed)
Please advise, I did not realize one of her meds was Xanax

## 2016-08-10 ENCOUNTER — Ambulatory Visit: Payer: Self-pay | Admitting: Family Medicine

## 2016-08-10 MED ORDER — ESCITALOPRAM OXALATE 20 MG PO TABS: 20.0000 mg | ORAL_TABLET | Freq: Every day | ORAL | 0 refills | Status: DC

## 2016-08-10 MED ORDER — ALPRAZOLAM 0.25 MG PO TABS: ORAL_TABLET | ORAL | 0 refills | Status: DC

## 2016-08-10 NOTE — Telephone Encounter (Signed)
Done-aa 

## 2016-08-10 NOTE — Telephone Encounter (Signed)
1 month

## 2016-08-15 ENCOUNTER — Ambulatory Visit (INDEPENDENT_AMBULATORY_CARE_PROVIDER_SITE_OTHER): Payer: Managed Care, Other (non HMO) | Admitting: Family Medicine

## 2016-08-15 ENCOUNTER — Encounter: Payer: Self-pay | Admitting: Family Medicine

## 2016-08-15 VITALS — BP 126/80 | HR 86 | Temp 97.8°F | Resp 16 | Wt 260.0 lb

## 2016-08-15 DIAGNOSIS — F419 Anxiety disorder, unspecified: Secondary | ICD-10-CM | POA: Diagnosis not present

## 2016-08-15 DIAGNOSIS — F988 Other specified behavioral and emotional disorders with onset usually occurring in childhood and adolescence: Secondary | ICD-10-CM

## 2016-08-15 DIAGNOSIS — K219 Gastro-esophageal reflux disease without esophagitis: Secondary | ICD-10-CM | POA: Diagnosis not present

## 2016-08-15 DIAGNOSIS — R0683 Snoring: Secondary | ICD-10-CM | POA: Diagnosis not present

## 2016-08-15 MED ORDER — ALPRAZOLAM 0.25 MG PO TABS: ORAL_TABLET | ORAL | 5 refills | Status: DC

## 2016-08-15 MED ORDER — METHYLPHENIDATE HCL ER (OSM) 36 MG PO TBCR: 36.0000 mg | EXTENDED_RELEASE_TABLET | Freq: Every day | ORAL | 0 refills | Status: DC

## 2016-08-15 MED ORDER — ESCITALOPRAM OXALATE 20 MG PO TABS: 20.0000 mg | ORAL_TABLET | Freq: Every day | ORAL | 11 refills | Status: DC

## 2016-08-15 NOTE — Progress Notes (Signed)
Subjective:  HPI Pt is here for follow of anxiety she was a Dr. Venia Minks pt. She needs refill on her xanax and lexapro today. She reports that she is doing well on these. She takes Xanax every night before bed and seems to help. She also reports that she had not had it in about 4 weeks and she could tell, she started it back last week and felt much better.  She reports that she feels like she has had something going on with her sinuses all winter long. She just feels like there is something "stuck" and pressure on the sides of her nose.  She was taking concerta for ADD with Dr. Venia Minks while she was in grad school. 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 She would like to know if she can get this filled for this reason again.  She is Sales executive. Depression screen PHQ 2/9 08/15/2016  Decreased Interest 1  Down, Depressed, Hopeless 1  PHQ - 2 Score 2  Altered sleeping 1  Tired, decreased energy 2  Change in appetite 0  Feeling bad or failure about yourself  2  Trouble concentrating 1  Moving slowly or fidgety/restless 0  Suicidal thoughts 0  PHQ-9 Score 8     Prior to Admission medications   Medication Sig Start Date End Date Taking? Authorizing Provider  ALPRAZolam Duanne Moron) 0.25 MG tablet TAKE 1 TABLET BY MOUTH AT BEDTIME AS NEEDED FOR ANXIETY 08/10/16  Yes Richard Maceo Pro., MD  escitalopram (LEXAPRO) 20 MG tablet Take 1 tablet (20 mg total) by mouth daily. 08/10/16  Yes Richard Maceo Pro., MD  fluticasone [facility]) 50 MCG/ACT nasal spray Place into the nose. Reported on 11/23/2015 10/01/14  Yes Historical Provider, MD  lansoprazole (PREVACID) 30 MG capsule Take 1 capsule (30 mg total) by mouth daily at 12 noon. 11/23/15  Yes Margarita Rana, MD  ranitidine (ZANTAC) 150 MG capsule Take 1 capsule (150 mg total) by mouth 2 (two) times daily. 11/23/15  Yes Margarita Rana, MD    Patient Active Problem List   Diagnosis Date Noted  . Chest pain 11/17/2015    . Pneumonia 11/03/2015  . Murmur, cardiac 11/03/2015  . Adult-onset obesity 03/16/2015  . Allergic rhinitis 03/16/2015  . Anxiety 03/16/2015  . Abnormal liver enzymes 03/16/2015  . Acid reflux 03/16/2015  . Blood glucose elevated 03/16/2015  . Hypertriglyceridemia 03/16/2015  . LBP (low back pain) 03/16/2015  . Apnea, sleep 03/16/2015  . Screening-pulmonary TB 03/16/2015  . Immunity status testing 03/16/2015  . Flu vaccine need 03/16/2015  . ADD (attention deficit disorder) 12/15/2014  . Benign neoplasm of skin 02/13/2014    Past Medical History:  Diagnosis Date  . Abdominal pain   . Allergic rhinitis   . Anginal pain ([place])   . Anxiety   . Benign neoplasm of skin   . Dyspnea   . GERD (gastroesophageal reflux disease)   . Heart murmur   . Obesity   . Polydipsia     Social History   Social History  . Marital status: Married    Spouse name: N/A  . Number of children: N/A  . Years of education: N/A   Occupational History  . Not on file.   Social History Main Topics  . Smoking status: Never Smoker  . Smokeless tobacco: Never Used  . Alcohol use Yes     Comment: Occasionally  . Drug use: No  . Sexual activity: Not  on file   Other Topics Concern  . Not on file   Social History Narrative  . No narrative on file    Allergies  Allergen Reactions  . Iodine Rash    Anaphylaxis if injected.   Clementeen Hoof [Iodinated Diagnostic Agents] Anaphylaxis    Review of Systems  Constitutional: Negative.   HENT: Positive for sinus pain.   Eyes: Negative.   Respiratory: Negative.   Cardiovascular: Negative.   Gastrointestinal: Negative.   Genitourinary: Negative.   Musculoskeletal: Negative.   Skin: Negative.   Neurological: Negative.   Endo/Heme/Allergies: Negative.   Psychiatric/Behavioral: The patient is nervous/anxious.     Immunization History  Administered Date(s) Administered  . Influenza,inj,Quad PF,36+ Mos 03/16/2015  . Tdap 03/16/2015    Objective:   BP 126/80 (BP Location: Left Arm, Patient Position: Sitting, Cuff Size: Large)   Pulse 86   Temp 97.8 F (36.6 C) (Oral)   Resp 16   Wt 260 lb (117.9 kg)   BMI 41.97 kg/m   Physical Exam  Constitutional: She is oriented to person, place, and time and well-developed, well-nourished, and in no distress.  HENT:  Head: Normocephalic and atraumatic.  Right Ear: External ear normal.  Left Ear: External ear normal.  Nose: Nose normal.  Mouth/Throat: Oropharynx is clear and moist.  Eyes: Conjunctivae and EOM are normal. Pupils are equal, round, and reactive to light.  Neck: Normal range of motion. Neck supple.  Cardiovascular: Normal rate, regular rhythm, normal heart sounds and intact distal pulses.   Pulmonary/Chest: Effort normal and breath sounds normal.  Musculoskeletal: Normal range of motion.  Neurological: She is alert and oriented to person, place, and time. She has normal reflexes. Gait normal. GCS score is 15.  Skin: Skin is warm and dry.  Psychiatric: Mood, memory, affect and judgment normal.    Lab Results  Component Value Date   WBC 6.6 11/18/2015   HGB 13.4 11/18/2015   HCT 39.9 11/18/2015   PLT 160 11/18/2015   GLUCOSE 96 11/18/2015   CHOL 191 11/18/2015   TRIG 171 (H) 11/18/2015   HDL 35 (L) 11/18/2015   LDLCALC 122 (H) 11/18/2015   TSH 1.778 11/18/2015   HGBA1C 5.7 08/28/2014    CMP     Component Value Date/Time   NA 139 11/18/2015 0437   NA 142 06/09/2015 1115   K 4.1 11/18/2015 0437   CL 107 11/18/2015 0437   CO2 27 11/18/2015 0437   GLUCOSE 96 11/18/2015 0437   BUN 19 11/18/2015 0437   BUN 14 06/09/2015 1115   CREATININE 0.91 11/18/2015 0437   CALCIUM 8.6 (L) 11/18/2015 0437   PROT 6.5 11/18/2015 0437   PROT 6.8 06/09/2015 1115   ALBUMIN 3.6 11/18/2015 0437   ALBUMIN 4.2 06/09/2015 1115   AST 32 11/18/2015 0437   ALT 45 11/18/2015 0437   ALKPHOS 57 11/18/2015 0437   BILITOT 0.5 11/18/2015 0437   BILITOT 0.3 06/09/2015 1115   GFRNONAA >60  11/18/2015 0437   GFRAA >60 11/18/2015 0437    Assessment and Plan :  1. Anxiety  - ALPRAZolam (XANAX) 0.25 MG tablet; TAKE 1 TABLET BY MOUTH AT BEDTIME AS NEEDED FOR ANXIETY  Dispense: 30 tablet; Refill: 5 - escitalopram (LEXAPRO) 20 MG tablet; Take 1 tablet (20 mg total) by mouth daily.  Dispense: 30 tablet; Refill: 11  2. Gastroesophageal reflux disease, esophagitis presence not specified   3. Attention deficit disorder, unspecified hyperactivity presence  - methylphenidate (CONCERTA) 36 MG PO CR  tablet; Take 1 tablet (36 mg total) by mouth daily.  Dispense: 30 tablet; Refill: 0  4. Snoring Epworth score 17.  - Ambulatory referral to Sleep Studies   HPI, Exam, and A&P Transcribed under the direction and in the presence of Richard L. Cranford Mon, MD  Electronically Signed: Katina Dung, CMA I have done the exam and reviewed the above chart and it is accurate to the best of my knowledge. Development worker, community has been used in this note in any air is in the dictation or transcription are unintentional.  [facility] 08/15/2016 3:18 PM

## 2016-10-15 ENCOUNTER — Other Ambulatory Visit: Payer: Self-pay | Admitting: Family Medicine

## 2016-10-15 DIAGNOSIS — F419 Anxiety disorder, unspecified: Secondary | ICD-10-CM

## 2016-12-11 ENCOUNTER — Encounter: Payer: Self-pay | Admitting: Family Medicine

## 2017-04-13 ENCOUNTER — Encounter: Payer: Self-pay | Admitting: *Deleted

## 2017-04-13 ENCOUNTER — Ambulatory Visit
Admission: EM | Admit: 2017-04-13 | Discharge: 2017-04-13 | Disposition: A | Discharge status: Home / Self Care | Payer: BC Managed Care – PPO | Attending: Family Medicine | Admitting: Family Medicine

## 2017-04-13 DIAGNOSIS — J0100 Acute maxillary sinusitis, unspecified: Secondary | ICD-10-CM | POA: Diagnosis not present

## 2017-04-13 MED ORDER — AMOXICILLIN-POT CLAVULANATE 875-125 MG PO TABS: 1.0000 | ORAL_TABLET | Freq: Two times a day (BID) | ORAL | 0 refills | Status: DC

## 2017-04-13 NOTE — Discharge Instructions (Signed)
Take medication as prescribed. Rest. Drink plenty of fluids.  ° °Follow up with your primary care physician this week as needed. Return to Urgent care for new or worsening concerns.  ° °

## 2017-04-13 NOTE — ED Provider Notes (Signed)
[facility] ____________________________________________  Time seen: Approximately 1:00 PM  I have reviewed the triage vital signs and the nursing notes.   HISTORY  Chief Complaint Nasal Congestion  HPI Natasha Boyd is a 46 y.o. female presented for evaluation of runny nose, nasal congestion, sinus pressure and postnasal drainage that is been present for 1 week.  Patient reports some intermittent coughing, but has had continued sinus pressure and described as mild to moderate this time.  States unresolved with over-the-counter cough and congestion medications.  Denies any fevers.  States some scratchy sore throat intermittently, denies current sore throat.  Denies any fevers.  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  Denies other aggravating or alleviating factors.  Reports otherwise feels well. Denies chest pain, shortness of breath, abdominal pain, dysuria, extremity pain, extremity swelling or rash. Denies recent sickness. Denies recent antibiotic use.  Jerrol Banana., MD: PCP    Past Medical History:  Diagnosis Date  . Abdominal pain   . Allergic rhinitis   . Anginal pain ([place])   . Anxiety   . Benign neoplasm of skin   . Dyspnea   . GERD (gastroesophageal reflux disease)   . Heart murmur   . Obesity   . Polydipsia     Patient Active Problem List   Diagnosis Date Noted  . Chest pain 11/17/2015  . Pneumonia 11/03/2015  . Murmur, cardiac 11/03/2015  . Adult-onset obesity 03/16/2015  . Allergic rhinitis 03/16/2015  . Anxiety 03/16/2015  . Abnormal liver enzymes 03/16/2015  . Acid reflux 03/16/2015  . Blood glucose elevated 03/16/2015  . Hypertriglyceridemia 03/16/2015  . LBP (low back pain) 03/16/2015  . Apnea, sleep 03/16/2015  . Screening-pulmonary TB 03/16/2015  . Immunity status testing 03/16/2015  . Flu vaccine need 03/16/2015  . ADD (attention deficit disorder) 12/15/2014  . Benign neoplasm of skin  02/13/2014    Past Surgical History:  Procedure Laterality Date  . ABDOMINAL HYSTERECTOMY  2011  . APPENDECTOMY  1996?  . CESAREAN SECTION  2007  . ESOPHAGOGASTRODUODENOSCOPY (EGD) WITH PROPOFOL N/A 03/03/2016   Procedure: ESOPHAGOGASTRODUODENOSCOPY (EGD) WITH PROPOFOL;  Surgeon: Manya Silvas, MD;  Location: [facility] ENDOSCOPY;  Service: Endoscopy;  Laterality: N/A;  . TONSILLECTOMY  2010     No current facility-administered medications for this encounter.   Current Outpatient Prescriptions:  .  ALPRAZolam (XANAX) 0.25 MG tablet, TAKE 1 TABLET BY MOUTH AT BEDTIME AS NEEDED, Disp: 30 tablet, Rfl: 1 .  escitalopram (LEXAPRO) 20 MG tablet, Take 1 tablet (20 mg total) by mouth daily., Disp: 30 tablet, Rfl: 11 .  lansoprazole (PREVACID) 30 MG capsule, Take 1 capsule (30 mg total) by mouth daily at 12 noon., Disp: 30 capsule, Rfl: 5 .  amoxicillin-clavulanate (AUGMENTIN) 875-125 MG tablet, Take 1 tablet by mouth every 12 (twelve) hours., Disp: 20 tablet, Rfl: 0 .  fluticasone (FLONASE) 50 MCG/ACT nasal spray, Place into the nose. Reported on 11/23/2015, Disp: , Rfl:  .  methylphenidate (CONCERTA) 36 MG PO CR tablet, Take 1 tablet (36 mg total) by mouth daily., Disp: 30 tablet, Rfl: 0 .  ranitidine (ZANTAC) 150 MG capsule, Take 1 capsule (150 mg total) by mouth 2 (two) times daily., Disp: 60 capsule, Rfl: 5  Allergies Iodine and Ivp dye [iodinated diagnostic agents]  Family History  Problem Relation Age of Onset  . Adopted: Yes  . Family history unknown: Yes    Social History Social History  Substance Use Topics  . Smoking status:  Never Smoker  . Smokeless tobacco: Never Used  . Alcohol use Yes     Comment: Occasionally    Review of Systems Constitutional: No fever/chills ENT: As above.  Cardiovascular: Denies chest pain. Respiratory: Denies shortness of breath. Gastrointestinal: No abdominal pain.  No nausea, no vomiting.   Musculoskeletal: Negative for back pain. Skin:  Negative for rash.  ____________________________________________   PHYSICAL EXAM:  VITAL SIGNS: ED Triage Vitals  Enc Vitals Group     BP 04/13/17 1211 119/61     Pulse Rate 04/13/17 1211 84     Resp 04/13/17 1211 16     Temp 04/13/17 1211 98.1 F (36.7 C)     Temp Source 04/13/17 1211 Oral     SpO2 04/13/17 1211 100 %     Weight 04/13/17 1213 250 lb (113.4 kg)     Height 04/13/17 1213 5\' 6"  (1.676 m)     Head Circumference --      Peak Flow --      Pain Score 04/13/17 1213 0     Pain Loc --      Pain Edu? --      Excl. in [place]? --     Constitutional: Alert and oriented. Well appearing and in no acute distress. Eyes: Conjunctivae are normal.  Head: Atraumatic.Mild to moderate tenderness to palpation bilateral maxillary sinuses. Mild bilateral frontal sinus tenderness. No swelling. No erythema.   Ears: no erythema, normal TMs bilaterally.   Nose: nasal congestion with bilateral nasal turbinate erythema and edema.   Mouth/Throat: Mucous membranes are moist.  Oropharynx non-erythematous.No tonsillar swelling or exudate.  Neck: No stridor.  No cervical spine tenderness to palpation. Hematological/Lymphatic/Immunilogical: No cervical lymphadenopathy. Cardiovascular: Normal rate, regular rhythm. Grossly normal heart sounds.  Good peripheral circulation. Respiratory: Normal respiratory effort.  No retractions. No wheezes, rales or rhonchi. Good air movement.  Gastrointestinal: Soft and nontender.  Musculoskeletal:  No cervical, thoracic or lumbar tenderness to palpation.  Neurologic:  Normal speech and language. No gait instability. Skin:  Skin is warm, dry and intact. No rash noted. Psychiatric: Mood and affect are normal. Speech and behavior are normal.  ___________________________________________   LABS (all labs ordered are listed, but only abnormal results are displayed)  Labs Reviewed - No data to display   PROCEDURES Procedures   INITIAL IMPRESSION / ASSESSMENT AND  PLAN / ED COURSE  Pertinent labs & imaging results that were available during my care of the patient were reviewed by me and considered in my medical decision making (see chart for details).  Well appearing. No acute distress. Suspect maxillary sinusitis, discussed some viral component.  Encouraged rest, fluids, supportive care.  Will treat patient with oral Augmentin.Discussed indication, risks and benefits of medications with patient.   Discussed follow up with Primary care physician this week. Discussed follow up and return parameters including no resolution or any worsening concerns. Patient verbalized understanding and agreed to plan.   ____________________________________________   FINAL CLINICAL IMPRESSION(S) / ED DIAGNOSES  Final diagnoses:  Acute maxillary sinusitis, recurrence not specified     Discharge Medication List as of 04/13/2017  1:09 PM    START taking these medications   Details  amoxicillin-clavulanate (AUGMENTIN) 875-125 MG tablet Take 1 tablet by mouth every 12 (twelve) hours., Starting Fri 04/13/2017, Normal        Note: This dictation was prepared with Dragon dictation along with smaller phrase technology. Any transcriptional errors that result from this process are unintentional.  Marylene Land, NP 04/13/17 [phone]

## 2017-04-13 NOTE — ED Triage Notes (Signed)
Patient started having sinus pressure and pain with drainage 1 week ago.

## 2017-08-02 ENCOUNTER — Telehealth: Payer: Self-pay

## 2017-08-02 MED ORDER — OSELTAMIVIR PHOSPHATE 75 MG PO CAPS: 75.0000 mg | ORAL_CAPSULE | Freq: Two times a day (BID) | ORAL | 0 refills | Status: DC

## 2017-08-02 NOTE — Telephone Encounter (Signed)
ok 

## 2017-08-02 NOTE — Telephone Encounter (Signed)
Rx sent to [facility].  Thanks,   -Mickel Baas

## 2017-08-02 NOTE — Telephone Encounter (Signed)
Pt states her Husband and Son both have confirmed flu.  Now she is starting to have symptoms headache body aches.  Would it be okay if you sent Tamiflu to [facility] in [place]?   Thanks,   -Mickel Baas

## 2017-08-02 NOTE — Telephone Encounter (Signed)
Please advise for pt and her husband. Thanks.

## 2017-08-26 ENCOUNTER — Other Ambulatory Visit: Payer: Self-pay | Admitting: Family Medicine

## 2017-08-26 DIAGNOSIS — F419 Anxiety disorder, unspecified: Secondary | ICD-10-CM

## 2017-09-05 ENCOUNTER — Other Ambulatory Visit: Payer: Self-pay | Admitting: Family Medicine

## 2017-09-05 DIAGNOSIS — F419 Anxiety disorder, unspecified: Secondary | ICD-10-CM

## 2017-10-23 ENCOUNTER — Other Ambulatory Visit: Payer: Self-pay | Admitting: Family Medicine

## 2017-10-23 DIAGNOSIS — F419 Anxiety disorder, unspecified: Secondary | ICD-10-CM

## 2017-10-26 ENCOUNTER — Other Ambulatory Visit: Payer: Self-pay | Admitting: Family Medicine

## 2017-10-26 DIAGNOSIS — F419 Anxiety disorder, unspecified: Secondary | ICD-10-CM

## 2017-10-26 NOTE — Telephone Encounter (Signed)
Pt needs refill on escitalopram 20 mg.  She is completely out.  She knows she needs a FU.  She uses [facility] [place]  Pt's call back is [phone]  Lavonne Chick

## 2017-10-28 MED ORDER — ESCITALOPRAM OXALATE 20 MG PO TABS: ORAL_TABLET | ORAL | 0 refills | Status: DC

## 2017-10-31 NOTE — Telephone Encounter (Signed)
1 month only.Last time without visit

## 2017-11-25 ENCOUNTER — Other Ambulatory Visit: Payer: Self-pay | Admitting: Family Medicine

## 2017-11-25 DIAGNOSIS — F419 Anxiety disorder, unspecified: Secondary | ICD-10-CM

## 2017-12-06 ENCOUNTER — Ambulatory Visit: Payer: BC Managed Care – PPO | Admitting: Family Medicine

## 2017-12-06 ENCOUNTER — Encounter: Payer: Self-pay | Admitting: Family Medicine

## 2017-12-06 VITALS — BP 122/68 | HR 82 | Temp 99.0°F | Resp 16 | Ht 66.0 in | Wt 262.0 lb

## 2017-12-06 DIAGNOSIS — E781 Pure hyperglyceridemia: Secondary | ICD-10-CM

## 2017-12-06 DIAGNOSIS — F419 Anxiety disorder, unspecified: Secondary | ICD-10-CM | POA: Diagnosis not present

## 2017-12-06 DIAGNOSIS — R5383 Other fatigue: Secondary | ICD-10-CM | POA: Diagnosis not present

## 2017-12-06 DIAGNOSIS — G4733 Obstructive sleep apnea (adult) (pediatric): Secondary | ICD-10-CM

## 2017-12-06 MED ORDER — ESCITALOPRAM OXALATE 20 MG PO TABS
ORAL_TABLET | ORAL | 0 refills | Status: DC
Start: 2017-12-06 — End: 2018-01-03

## 2017-12-06 NOTE — Progress Notes (Signed)
Patient: Natasha Boyd Female    DOB: Dec 18, 1970   47 y.o.   MRN: [id number] Visit Date: 12/06/2017  Today's Provider: Wilhemena Durie, MD   Chief Complaint  Patient presents with  . Anxiety  . Gastroesophageal Reflux  . ADHD  . Weight Gain   Subjective:    HPI Patient is a 46 year old female who presents today for a follow up.  Anxiety Patient was last seen in the office 1 year ago. No changes were made in her medications. She is currently taking alprazolam 0.25mg  as needed. Patient reports that she has not taken this in several months. She feels that she needs to start back on the medication because her panic attacks have returned (over the last 2 months).   GAD 7 : Generalized Anxiety Score 12/06/2017  Nervous, Anxious, on Edge 2  Control/stop worrying 2  Worry too much - different things 2  Trouble relaxing 3  Restless 1  Easily annoyed or irritable 1  Afraid - awful might happen 0  Total GAD 7 Score 11  Anxiety Difficulty Somewhat difficult      GERD Patient reports good compliance and good symptom control while on Zantac 150mg  daily. Denies any breakthrough symptoms.  ADD Patient reports that she is no longer taking Concerta. She has felt that she does not need it at the moment.  Weight gain Patient wants to discuss starting something to help her lose weight. She is finding herself struggling with her weight gain.  Wt Readings from Last 3 Encounters:  12/06/17 262 lb (118.8 kg)  04/13/17 250 lb (113.4 kg)  08/15/16 260 lb (117.9 kg)      Allergies  Allergen Reactions  . Iodine Rash    Anaphylaxis if injected.   Clementeen Hoof [Iodinated Diagnostic Agents] Anaphylaxis     Current Outpatient Medications:  .  ALPRAZolam (XANAX) 0.25 MG tablet, TAKE 1 TABLET BY MOUTH AT BEDTIME AS NEEDED, Disp: 30 tablet, Rfl: 1 .  escitalopram (LEXAPRO) 20 MG tablet, TAKE 1 TABLET(20 MG) BY MOUTH DAILY, Disp: 30 tablet, Rfl: 0 .  fluticasone (FLONASE) 50  MCG/ACT nasal spray, Place into the nose. Reported on 11/23/2015, Disp: , Rfl:  .  ranitidine (ZANTAC) 150 MG capsule, Take 1 capsule (150 mg total) by mouth 2 (two) times daily., Disp: 60 capsule, Rfl: 5 .  amoxicillin-clavulanate (AUGMENTIN) 875-125 MG tablet, Take 1 tablet by mouth every 12 (twelve) hours. (Patient not taking: Reported on 12/06/2017), Disp: 20 tablet, Rfl: 0 .  lansoprazole (PREVACID) 30 MG capsule, Take 1 capsule (30 mg total) by mouth daily at 12 noon., Disp: 30 capsule, Rfl: 5 .  methylphenidate (CONCERTA) 36 MG PO CR tablet, Take 1 tablet (36 mg total) by mouth daily. (Patient not taking: Reported on 12/06/2017), Disp: 30 tablet, Rfl: 0 .  oseltamivir (TAMIFLU) 75 MG capsule, Take 1 capsule (75 mg total) by mouth 2 (two) times daily. (Patient not taking: Reported on 12/06/2017), Disp: 10 capsule, Rfl: 0  Review of Systems  Constitutional: Positive for appetite change and fatigue.  HENT: Negative.   Eyes: Negative.   Respiratory: Negative.   Cardiovascular: Negative for chest pain and leg swelling.  Allergic/Immunologic: Negative for environmental allergies.  Neurological: Negative for dizziness, light-headedness and headaches.  Psychiatric/Behavioral: Positive for sleep disturbance. Negative for agitation, decreased concentration, hallucinations, self-injury and suicidal ideas. The patient is nervous/anxious. The patient is not hyperactive.     Social History   Tobacco Use  .  Smoking status: Never Smoker  . Smokeless tobacco: Never Used  Substance Use Topics  . Alcohol use: Yes    Comment: Occasionally   Objective:   BP 122/68 (BP Location: Left Arm, Patient Position: Sitting, Cuff Size: Large)   Pulse 82   Temp 99 F (37.2 C)   Resp 16   Ht 5\' 6"  (1.676 m)   Wt 262 lb (118.8 kg)   SpO2 98%   BMI 42.29 kg/m  Vitals:   12/06/17 0913  BP: 122/68  Pulse: 82  Resp: 16  Temp: 99 F (37.2 C)  SpO2: 98%  Weight: 262 lb (118.8 kg)  Height: 5\' 6"  (1.676 m)      Physical Exam  Constitutional: She is oriented to person, place, and time. She appears well-developed and well-nourished.  HENT:  Head: Normocephalic and atraumatic.  Right Ear: External ear normal.  Left Ear: External ear normal.  Nose: Nose normal.  Eyes: Conjunctivae are normal. No scleral icterus.  Neck: No thyromegaly present.  Cardiovascular: Normal rate and regular rhythm.  Murmur heard. Aortic murmur.  Pulmonary/Chest: Effort normal and breath sounds normal.  Abdominal: Soft.  Musculoskeletal: She exhibits no edema.  Neurological: She is alert and oriented to person, place, and time.  Skin: Skin is warm and dry.  Psychiatric: She has a normal mood and affect. Her behavior is normal. Judgment and thought content normal.        Assessment & Plan:     1. Other fatigue  - CBC with Differential/Platelet - Comprehensive metabolic panel - TSH  2. OSA (obstructive sleep apnea)  - CBC with Differential/Platelet - Comprehensive metabolic panel  3. Morbid obesity ([place])  - CBC with Differential/Platelet - Comprehensive metabolic panel  4. Hypertriglyceridemia  - Lipid Panel With LDL/HDL Ratio  5. Anxiety Pt advised to use EAP. - escitalopram (LEXAPRO) 20 MG tablet; TAKE 1 TABLET(20 MG) BY MOUTH DAILY  Dispense: 30 tablet; Refill: 0 6.Noncompliance with follow up     I have done the exam and reviewed the above chart and it is accurate to the best of my knowledge. Development worker, community has been used in this note in any air is in the dictation or transcription are unintentional.  Wilhemena Durie, MD  [place]

## 2017-12-12 IMAGING — US US EXTREM LOW VENOUS BILAT
2 series · 13 of 24 positions shown · non-contrast
Comparison: None.

CLINICAL DATA: Recent prolonged sitting while traveling; elevated
D-dimer

EXAM:
BILATERAL LOWER EXTREMITY VENOUS DUPLEX ULTRASOUND
TECHNIQUE: Gray-scale sonography with graded compression, as well as color
Doppler and duplex ultrasound were performed to evaluate the lower
extremity deep venous systems from the level of the common femoral
vein and including the common femoral, femoral, profunda femoral,
popliteal and calf veins including the posterior tibial, peroneal
and gastrocnemius veins when visible. The superficial great
saphenous vein was also interrogated. Spectral Doppler was utilized
to evaluate flow at rest and with distal augmentation maneuvers in
the common femoral, femoral and popliteal veins.

[Series 1: us extrem low venous bilat · 0.09mm/px · 6 of 30 slices shown (1 of 2)]
[im 1/30]
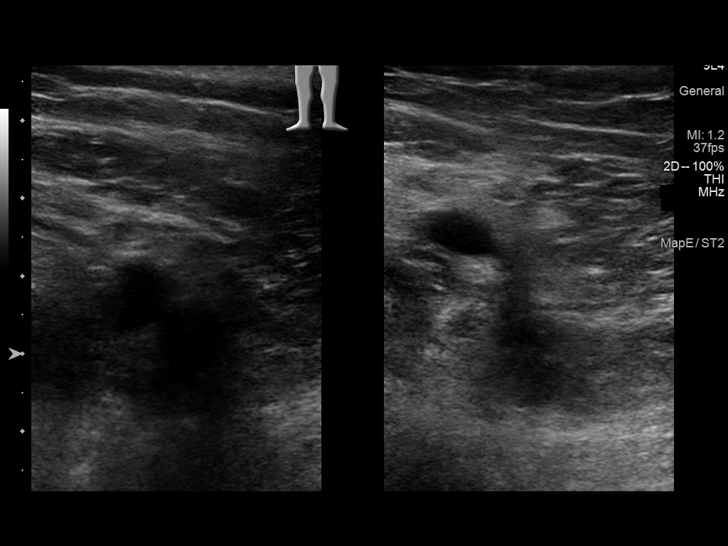
[im 6/30]
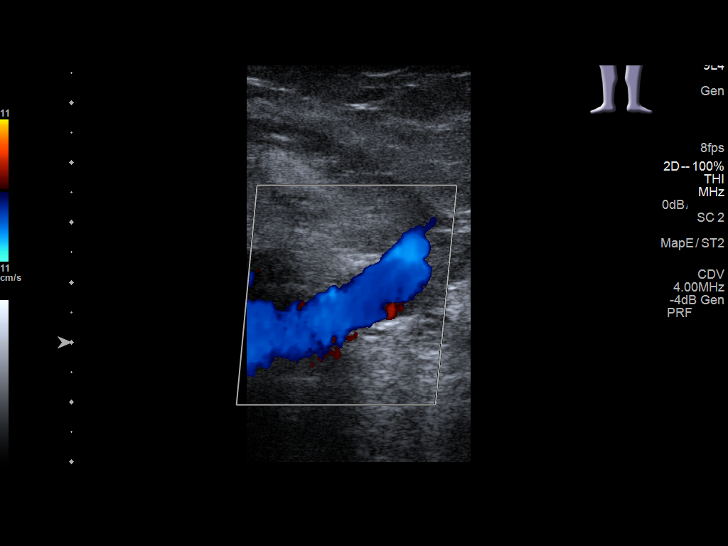
[im 11/30]
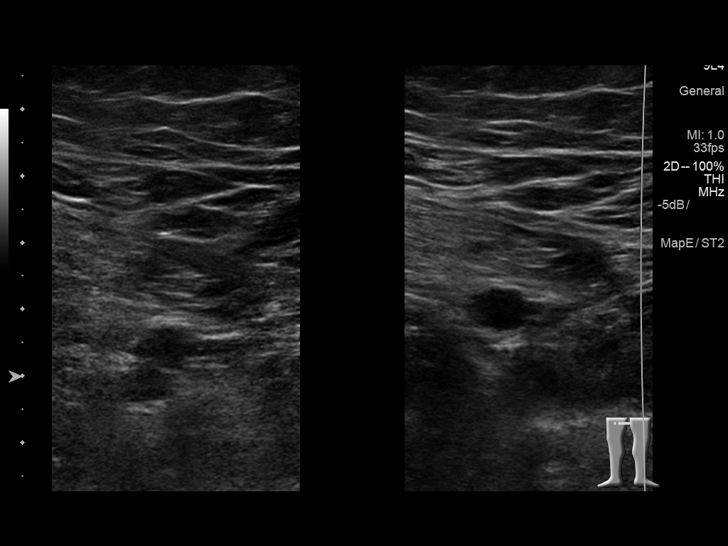
[im 16/30]
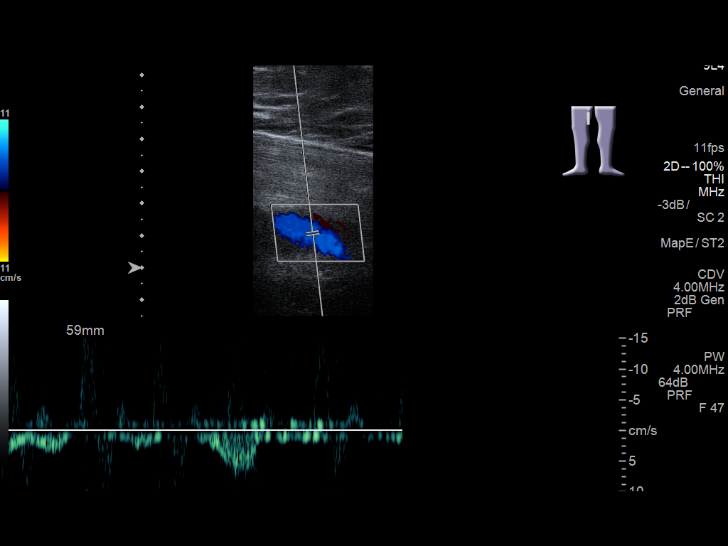
[im 22/30]
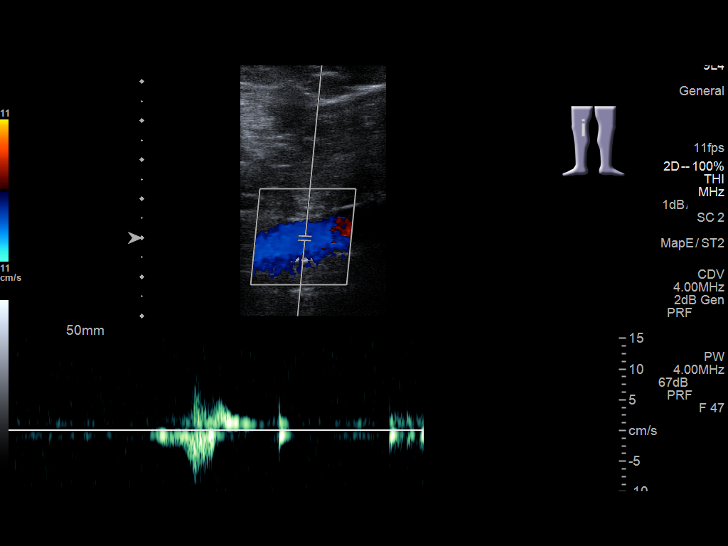
[im 27/30]
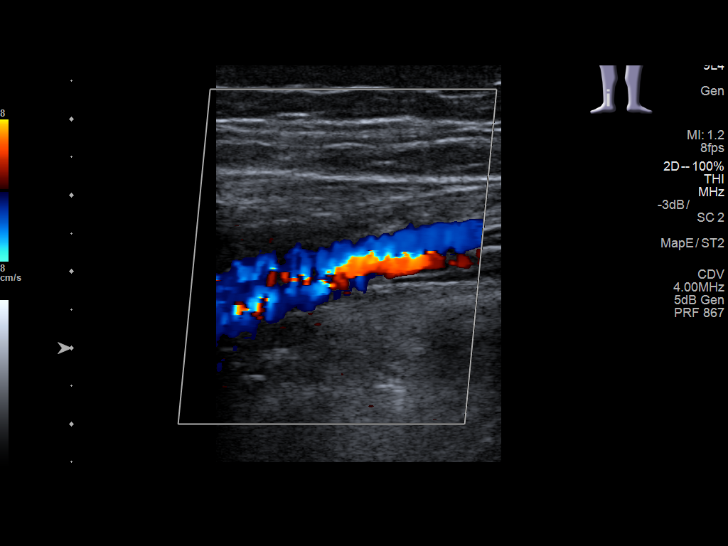

[Series 2: us extrem low venous bilat · 0.08mm/px · 7 of 31 slices shown (2 of 2)]
[im 1/31]
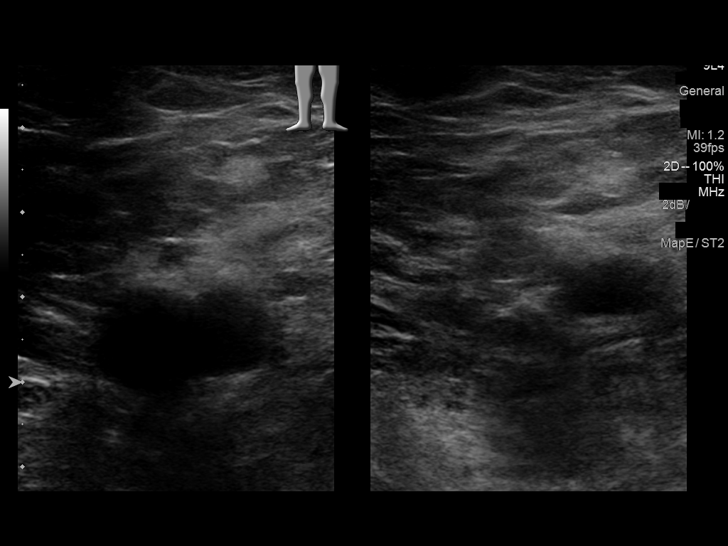
[im 3/31]
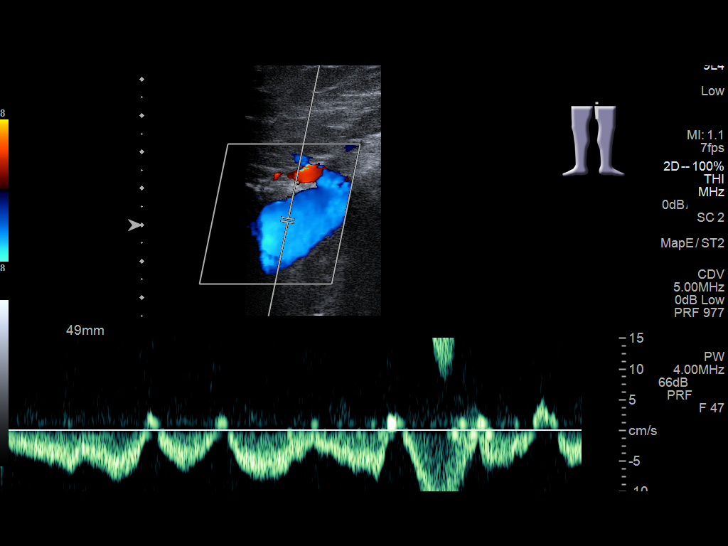
[im 9/31]
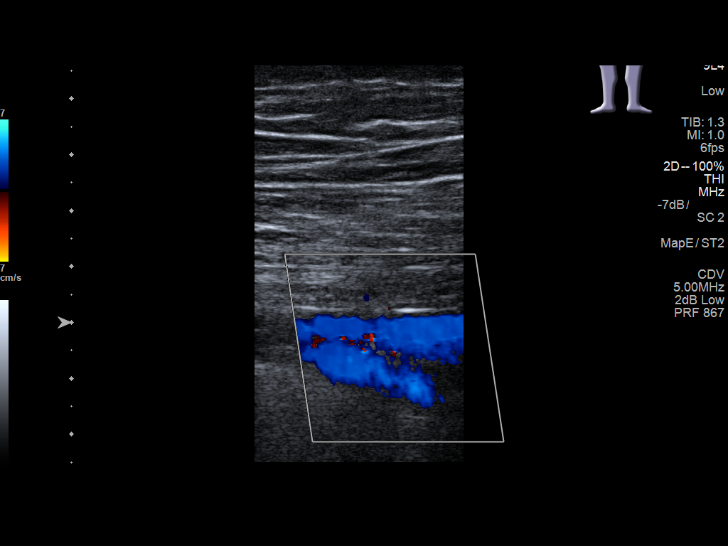
[im 14/31]
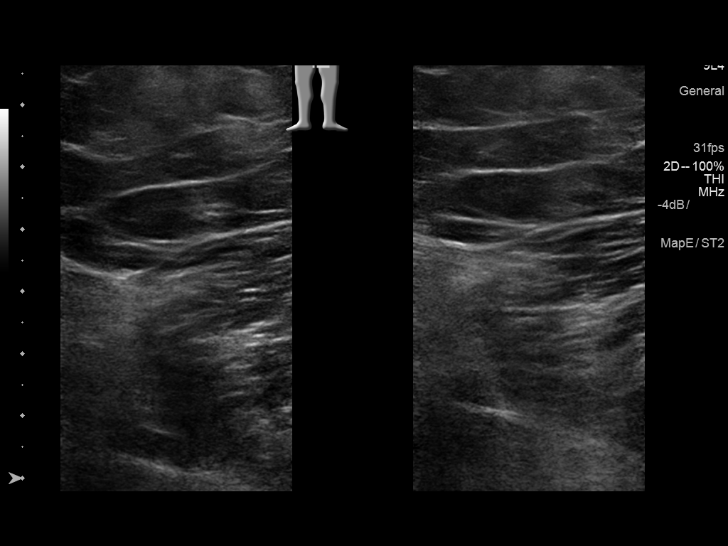
[im 20/31]
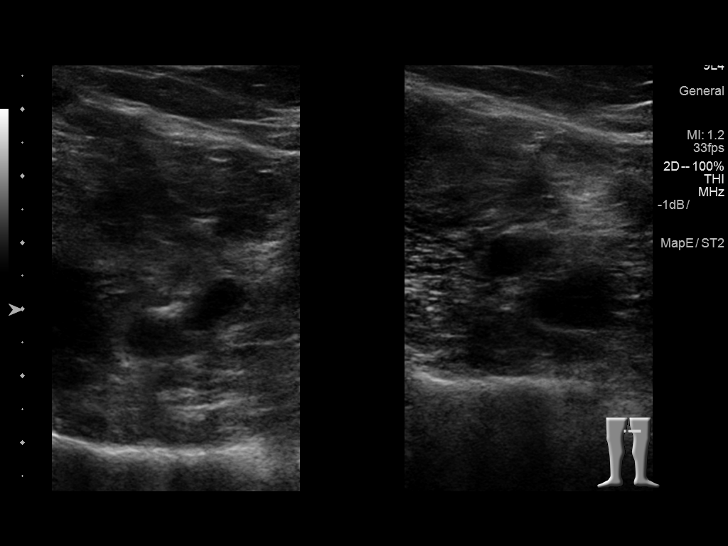
[im 25/31]
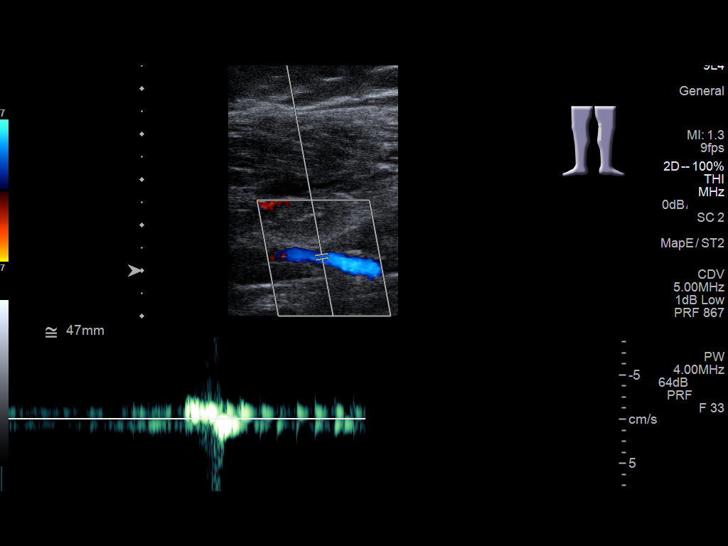
[im 31/31]
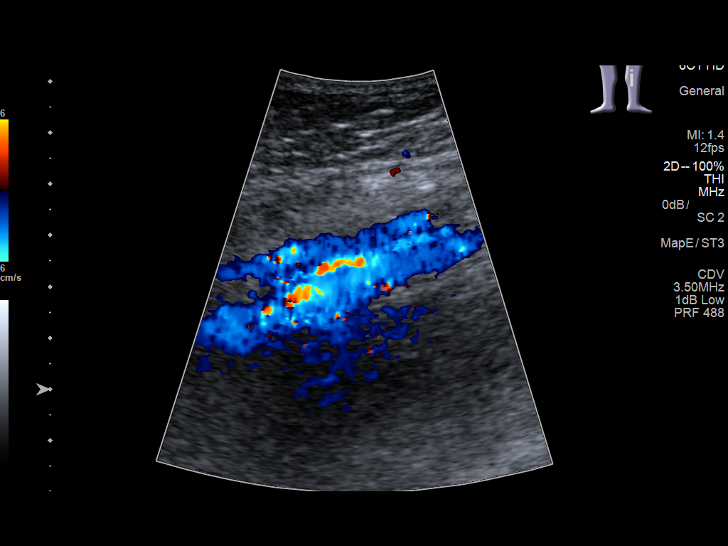

[13 of 24 positions shown; findings below may reference images not displayed]

FINDINGS: RIGHT LOWER EXTREMITY

Common Femoral Vein: No evidence of thrombus. Normal
compressibility, respiratory phasicity and response to augmentation.

Saphenofemoral Junction: No evidence of thrombus. Normal
compressibility and flow on color Doppler imaging.

Profunda Femoral Vein: No evidence of thrombus. Normal
compressibility and flow on color Doppler imaging.

Femoral Vein: No evidence of thrombus. Normal compressibility,
respiratory phasicity and response to augmentation.

Popliteal Vein: No evidence of thrombus. Normal compressibility,
respiratory phasicity and response to augmentation.

Calf Veins: No evidence of thrombus. Normal compressibility and flow
on color Doppler imaging.

Superficial Great Saphenous Vein: No evidence of thrombus. Normal
compressibility and flow on color Doppler imaging.

Venous Reflux:  None.

Other Findings:  None.

LEFT LOWER EXTREMITY

Common Femoral Vein: No evidence of thrombus. Normal
compressibility, respiratory phasicity and response to augmentation.

Saphenofemoral Junction: No evidence of thrombus. Normal
compressibility and flow on color Doppler imaging.

Profunda Femoral Vein: No evidence of thrombus. Normal
compressibility and flow on color Doppler imaging.

Femoral Vein: No evidence of thrombus. Normal compressibility,
respiratory phasicity and response to augmentation.

Popliteal Vein: No evidence of thrombus. Normal compressibility,
respiratory phasicity and response to augmentation.

Calf Veins: No evidence of thrombus. Normal compressibility and flow
on color Doppler imaging.

Superficial Great Saphenous Vein: No evidence of thrombus. Normal
compressibility and flow on color Doppler imaging.

Venous Reflux:  None.

Other Findings:  None.
IMPRESSION: No evidence of deep venous thrombosis in either lower extremity.

## 2017-12-13 LAB — CBC WITH DIFFERENTIAL/PLATELET
BASOS: 1 %
Basophils Absolute: 0 10*3/uL (ref 0.0–0.2)
EOS (ABSOLUTE): 0.2 10*3/uL (ref 0.0–0.4)
EOS: 4 %
Hematocrit: 43.5 % (ref 34.0–46.6)
Hemoglobin: 14.3 g/dL (ref 11.1–15.9)
IMMATURE GRANS (ABS): 0 10*3/uL (ref 0.0–0.1)
IMMATURE GRANULOCYTES: 0 %
LYMPHS: 33 %
Lymphocytes Absolute: 2 10*3/uL (ref 0.7–3.1)
MCH: 28.9 pg (ref 26.6–33.0)
MCHC: 32.9 g/dL (ref 31.5–35.7)
MCV: 88 fL (ref 79–97)
MONOCYTES: 10 %
Monocytes Absolute: 0.6 10*3/uL (ref 0.1–0.9)
NEUTROS PCT: 52 %
Neutrophils Absolute: 3.1 10*3/uL (ref 1.4–7.0)
PLATELETS: 180 10*3/uL (ref 150–450)
RBC: 4.94 x10E6/uL (ref 3.77–5.28)
RDW: 13.5 % (ref 12.3–15.4)
WBC: 6 10*3/uL (ref 3.4–10.8)

## 2017-12-13 LAB — LIPID PANEL WITH LDL/HDL RATIO
Cholesterol, Total: 223 mg/dL — ABNORMAL HIGH (ref 100–199)
HDL: 43 mg/dL (ref >39)
LDL CALC: 149 mg/dL — AB (ref 0–99)
LDL/HDL RATIO: 3.5 ratio — AB (ref 0.0–3.2)
Triglycerides: 155 mg/dL — ABNORMAL HIGH (ref 0–149)
VLDL CHOLESTEROL CAL: 31 mg/dL (ref 5–40)

## 2017-12-13 LAB — COMPREHENSIVE METABOLIC PANEL
A/G RATIO: 1.6 (ref 1.2–2.2)
ALT: 56 IU/L — AB (ref 0–32)
AST: 42 IU/L — ABNORMAL HIGH (ref 0–40)
Albumin: 4.4 g/dL (ref 3.5–5.5)
Alkaline Phosphatase: 90 IU/L (ref 39–117)
BUN/Creatinine Ratio: 15 (ref 9–23)
BUN: 14 mg/dL (ref 6–24)
Bilirubin Total: 0.2 mg/dL (ref 0.0–1.2)
CALCIUM: 9.3 mg/dL (ref 8.7–10.2)
CO2: 24 mmol/L (ref 20–29)
Chloride: 105 mmol/L (ref 96–106)
Creatinine, Ser: 0.93 mg/dL (ref 0.57–1.00)
GFR, EST AFRICAN AMERICAN: 85 mL/min/{1.73_m2} (ref >59)
GFR, EST NON AFRICAN AMERICAN: 73 mL/min/{1.73_m2} (ref >59)
Globulin, Total: 2.8 g/dL (ref 1.5–4.5)
Glucose: 84 mg/dL (ref 65–99)
POTASSIUM: 4.4 mmol/L (ref 3.5–5.2)
Sodium: 143 mmol/L (ref 134–144)
TOTAL PROTEIN: 7.2 g/dL (ref 6.0–8.5)

## 2017-12-13 LAB — TSH: TSH: 2.2 u[IU]/mL (ref 0.450–4.500)

## 2018-01-03 ENCOUNTER — Other Ambulatory Visit: Payer: Self-pay | Admitting: Family Medicine

## 2018-01-03 DIAGNOSIS — F419 Anxiety disorder, unspecified: Secondary | ICD-10-CM

## 2018-01-29 ENCOUNTER — Ambulatory Visit (INDEPENDENT_AMBULATORY_CARE_PROVIDER_SITE_OTHER): Payer: BC Managed Care – PPO | Admitting: Family Medicine

## 2018-01-29 VITALS — BP 118/84 | HR 82 | Temp 98.1°F | Resp 16 | Wt 266.0 lb

## 2018-01-29 DIAGNOSIS — R0609 Other forms of dyspnea: Secondary | ICD-10-CM | POA: Diagnosis not present

## 2018-01-29 DIAGNOSIS — R079 Chest pain, unspecified: Secondary | ICD-10-CM | POA: Diagnosis not present

## 2018-01-29 DIAGNOSIS — G4733 Obstructive sleep apnea (adult) (pediatric): Secondary | ICD-10-CM

## 2018-01-29 DIAGNOSIS — F419 Anxiety disorder, unspecified: Secondary | ICD-10-CM

## 2018-01-29 DIAGNOSIS — R011 Cardiac murmur, unspecified: Secondary | ICD-10-CM

## 2018-01-29 MED ORDER — ALPRAZOLAM 0.25 MG PO TABS: 0.2500 mg | ORAL_TABLET | Freq: Three times a day (TID) | ORAL | 4 refills | Status: DC | PRN

## 2018-01-29 NOTE — Progress Notes (Addendum)
Natasha Boyd  MRN: [id number] DOB: Nov 13, 1970  Subjective:  HPI   The patient is a 47 year old female who presents for follow up of her anxiety.  She was last seen on 12/06/17.  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  The patient states that she had to put her father in hospice home this morning and she feels she is really going to need something for her anxiety.   Patient Active Problem List   Diagnosis Date Noted  . Chest pain 11/17/2015  . Pneumonia 11/03/2015  . Murmur, cardiac 11/03/2015  . Adult-onset obesity 03/16/2015  . Allergic rhinitis 03/16/2015  . Anxiety 03/16/2015  . Abnormal liver enzymes 03/16/2015  . Acid reflux 03/16/2015  . Blood glucose elevated 03/16/2015  . Hypertriglyceridemia 03/16/2015  . LBP (low back pain) 03/16/2015  . Apnea, sleep 03/16/2015  . Screening-pulmonary TB 03/16/2015  . Immunity status testing 03/16/2015  . Flu vaccine need 03/16/2015  . ADD (attention deficit disorder) 12/15/2014  . Benign neoplasm of skin 02/13/2014    Past Medical History:  Diagnosis Date  . Abdominal pain   . Allergic rhinitis   . Anginal pain ([place])   . Anxiety   . Benign neoplasm of skin   . Dyspnea   . GERD (gastroesophageal reflux disease)   . Heart murmur   . Obesity   . Polydipsia     Social History   Socioeconomic History  . Marital status: Married    Spouse name: Not on file  . Number of children: Not on file  . Years of education: Not on file  . Highest education level: Not on file  Occupational History  . Not on file  Social Needs  . Financial resource strain: Not on file  . Food insecurity:    Worry: Not on file    Inability: Not on file  . Transportation needs:    Medical: Not on file    Non-medical: Not on file  Tobacco Use  .  Smoking status: Never Smoker  . Smokeless tobacco: Never Used  Substance and Sexual Activity  . Alcohol use: Yes    Comment: Occasionally  . Drug use: No  . Sexual activity: Not on file  Lifestyle  . Physical activity:    Days per week: Not on file    Minutes per session: Not on file  . Stress: Not on file  Relationships  . Social connections:    Talks on phone: Not on file    Gets together: Not on file    Attends religious service: Not on file    Active member of club or organization: Not on file    Attends meetings of clubs or organizations: Not on file    Relationship status: Not on file  . Intimate partner violence:    Fear of current or ex partner: Not on file    Emotionally abused: Not on file    Physically abused: Not on file    Forced sexual activity: Not on file  Other Topics Concern  . Not on file  Social History Narrative  . Not on file    Outpatient Encounter Medications as of 01/29/2018  Medication Sig  . escitalopram (LEXAPRO) 20 MG tablet TAKE 1 TABLET BY MOUTH DAILY  .  fluticasone (FLONASE) 50 MCG/ACT nasal spray Place into the nose. Reported on 11/23/2015  . lansoprazole (PREVACID) 30 MG capsule Take 1 capsule (30 mg total) by mouth daily at 12 noon.  . ranitidine (ZANTAC) 150 MG capsule Take 1 capsule (150 mg total) by mouth 2 (two) times daily.  . [DISCONTINUED] ALPRAZolam (XANAX) 0.25 MG tablet TAKE 1 TABLET BY MOUTH AT BEDTIME AS NEEDED  . [DISCONTINUED] amoxicillin-clavulanate (AUGMENTIN) 875-125 MG tablet Take 1 tablet by mouth every 12 (twelve) hours. (Patient not taking: Reported on 12/06/2017)  . [DISCONTINUED] methylphenidate (CONCERTA) 36 MG PO CR tablet Take 1 tablet (36 mg total) by mouth daily. (Patient not taking: Reported on 12/06/2017)  . [DISCONTINUED] oseltamivir (TAMIFLU) 75 MG capsule Take 1 capsule (75 mg total) by mouth 2 (two) times daily. (Patient not taking: Reported on 12/06/2017)   No facility-administered encounter medications on file  as of 01/29/2018.     Allergies  Allergen Reactions  . Iodine Rash    Anaphylaxis if injected.   Clementeen Hoof [Iodinated Diagnostic Agents] Anaphylaxis    Review of Systems  Constitutional: Positive for malaise/fatigue.  Eyes: Negative.   Respiratory: Positive for shortness of breath (chronic with exertion, unchanged-has seen cardio in the past.  Increased heart rate when having panic attack.). Negative for cough and wheezing.   Cardiovascular: Negative for chest pain, palpitations, orthopnea, claudication and leg swelling.  Gastrointestinal: Negative.   Endo/Heme/Allergies: Negative.   Psychiatric/Behavioral: The patient is nervous/anxious.     Objective:  BP 118/84 (BP Location: Right Arm, Patient Position: Sitting, Cuff Size: Normal)   Pulse 82   Temp 98.1 F (36.7 C) (Oral)   Resp 16   Wt 266 lb (120.7 kg)   BMI 42.93 kg/m   Physical Exam  Constitutional: She is oriented to person, place, and time and well-developed, well-nourished, and in no distress.  HENT:  Head: Normocephalic and atraumatic.  Eyes: Conjunctivae are normal. No scleral icterus.  Neck: No thyromegaly present.  Cardiovascular: Normal rate, regular rhythm and normal heart sounds.  Pulmonary/Chest: Effort normal and breath sounds normal.  Abdominal: Soft.  Musculoskeletal: She exhibits no edema.  Neurological: She is alert and oriented to person, place, and time. Gait normal. GCS score is 15.  Skin: Skin is warm and dry.  Psychiatric: Mood, memory, affect and judgment normal.    Assessment and Plan :  1. DOE (dyspnea on exertion)   2. Chest pain, unspecified type   3. Anxiety RTC this fall. F/u with EAP as discussed. - ALPRAZolam (XANAX) 0.25 MG tablet; Take 1 tablet (0.25 mg total) by mouth 3 (three) times daily as needed for anxiety.  Dispense: 60 tablet; Refill: 4  4. Mitral click-murmur syndrome Known mild/moderate AS. - Ambulatory referral to Cardiology 5.OSA Doing well on CPAP.  I have  done the exam and reviewed the chart and it is accurate to the best of my knowledge. Development worker, community has been used and  any errors in dictation or transcription are unintentional. Miguel Aschoff M.D. [facility]

## 2018-02-07 ENCOUNTER — Other Ambulatory Visit: Payer: Self-pay | Admitting: Family Medicine

## 2018-02-07 DIAGNOSIS — F419 Anxiety disorder, unspecified: Secondary | ICD-10-CM

## 2018-05-24 ENCOUNTER — Emergency Department: Payer: BC Managed Care – PPO

## 2018-05-24 ENCOUNTER — Other Ambulatory Visit: Payer: Self-pay

## 2018-05-24 ENCOUNTER — Emergency Department
Admission: EM | Admit: 2018-05-24 | Discharge: 2018-05-24 | Disposition: A | Discharge status: Home / Self Care | Payer: BC Managed Care – PPO | Attending: Emergency Medicine | Admitting: Emergency Medicine

## 2018-05-24 ENCOUNTER — Other Ambulatory Visit: Payer: Self-pay | Admitting: Cardiology

## 2018-05-24 DIAGNOSIS — R55 Syncope and collapse: Secondary | ICD-10-CM | POA: Diagnosis present

## 2018-05-24 DIAGNOSIS — H53133 Sudden visual loss, bilateral: Secondary | ICD-10-CM | POA: Insufficient documentation

## 2018-05-24 DIAGNOSIS — R42 Dizziness and giddiness: Secondary | ICD-10-CM | POA: Diagnosis not present

## 2018-05-24 DIAGNOSIS — R0789 Other chest pain: Secondary | ICD-10-CM

## 2018-05-24 DIAGNOSIS — R079 Chest pain, unspecified: Secondary | ICD-10-CM | POA: Diagnosis not present

## 2018-05-24 DIAGNOSIS — R51 Headache: Secondary | ICD-10-CM | POA: Insufficient documentation

## 2018-05-24 DIAGNOSIS — R519 Headache, unspecified: Secondary | ICD-10-CM

## 2018-05-24 DIAGNOSIS — G459 Transient cerebral ischemic attack, unspecified: Secondary | ICD-10-CM

## 2018-05-24 DIAGNOSIS — Z79899 Other long term (current) drug therapy: Secondary | ICD-10-CM | POA: Insufficient documentation

## 2018-05-24 LAB — COMPREHENSIVE METABOLIC PANEL WITH GFR
ALT: 51 U/L — ABNORMAL HIGH (ref 0–44)
AST: 35 U/L (ref 15–41)
Albumin: 4 g/dL (ref 3.5–5.0)
Alkaline Phosphatase: 75 U/L (ref 38–126)
Anion gap: 7 (ref 5–15)
BUN: 14 mg/dL (ref 6–20)
CO2: 27 mmol/L (ref 22–32)
Calcium: 8.9 mg/dL (ref 8.9–10.3)
Chloride: 106 mmol/L (ref 98–111)
Creatinine, Ser: 0.88 mg/dL (ref 0.44–1.00)
GFR calc Af Amer: >60 mL/min (ref >60)
GFR calc non Af Amer: >60 mL/min (ref >60)
Glucose, Bld: 87 mg/dL (ref 70–99)
Potassium: 4.2 mmol/L (ref 3.5–5.1)
Sodium: 140 mmol/L (ref 135–145)
Total Bilirubin: 0.5 mg/dL (ref 0.3–1.2)
Total Protein: 7.5 g/dL (ref 6.5–8.1)

## 2018-05-24 LAB — DIFFERENTIAL
Abs Immature Granulocytes: 0.02 10*3/uL (ref 0.00–0.07)
BASOS PCT: 0 %
Basophils Absolute: 0 10*3/uL (ref 0.0–0.1)
Eosinophils Absolute: 0.2 10*3/uL (ref 0.0–0.5)
Eosinophils Relative: 2 %
Immature Granulocytes: 0 %
Lymphocytes Relative: 33 %
Lymphs Abs: 2.2 10*3/uL (ref 0.7–4.0)
Monocytes Absolute: 0.7 10*3/uL (ref 0.1–1.0)
Monocytes Relative: 10 %
Neutro Abs: 3.6 10*3/uL (ref 1.7–7.7)
Neutrophils Relative %: 55 %

## 2018-05-24 LAB — CBC
HCT: 42.7 % (ref 36.0–46.0)
Hemoglobin: 14.1 g/dL (ref 12.0–15.0)
MCH: 29.4 pg (ref 26.0–34.0)
MCHC: 33 g/dL (ref 30.0–36.0)
MCV: 89.1 fL (ref 80.0–100.0)
Platelets: 187 K/uL (ref 150–400)
RBC: 4.79 MIL/uL (ref 3.87–5.11)
RDW: 12.8 % (ref 11.5–15.5)
WBC: 6.7 K/uL (ref 4.0–10.5)
nRBC: 0 % (ref 0.0–0.2)

## 2018-05-24 LAB — APTT: aPTT: 28 s (ref 24–36)

## 2018-05-24 LAB — PROTIME-INR
INR: 0.94
Prothrombin Time: 12.5 s (ref 11.4–15.2)

## 2018-05-24 LAB — GLUCOSE, CAPILLARY: Glucose-Capillary: 79 mg/dL (ref 70–99)

## 2018-05-24 LAB — TROPONIN I: Troponin I: <0.03 ng/mL (ref <0.03)

## 2018-05-24 MED ORDER — ASPIRIN 81 MG PO CHEW
324.0000 mg | CHEWABLE_TABLET | Freq: Once | ORAL | Status: AC
  Administered 2018-05-24: 324 mg via ORAL
  Filled 2018-05-24: qty 4

## 2018-05-24 NOTE — ED Notes (Signed)
Lab notified that ED staff and IV RN unable to obtain labs and that a phlebotomist is needed to come to try to obtain labs. Spoke with [facility].

## 2018-05-24 NOTE — Consult Note (Signed)
CODE STROKE- PHARMACY COMMUNICATION   Time CODE STROKE called/page received:[id number]  Time response to CODE STROKE was made (in person or via phone):   Time Stroke Kit retrieved from [place] (only if needed):N/A  Name of Provider/Nurse contacted:Olivia  Past Medical History:  Diagnosis Date  . Abdominal pain   . Allergic rhinitis   . Anginal pain ([place])   . Anxiety   . Benign neoplasm of skin   . Dyspnea   . GERD (gastroesophageal reflux disease)   . Heart murmur   . Obesity   . Polydipsia    Prior to Admission medications   Medication Sig Start Date End Date Taking? Authorizing Provider  ALPRAZolam (XANAX) 0.25 MG tablet Take 1 tablet (0.25 mg total) by mouth 3 (three) times daily as needed for anxiety. 01/29/18   Jerrol Banana., MD  escitalopram (LEXAPRO) 20 MG tablet TAKE 1 TABLET BY MOUTH DAILY 02/07/18   Jerrol Banana., MD  fluticasone [facility]) 50 MCG/ACT nasal spray Place into the nose. Reported on 11/23/2015 10/01/14   [provider]  lansoprazole (PREVACID) 30 MG capsule Take 1 capsule (30 mg total) by mouth daily at 12 noon. 11/23/15   Margarita Rana, MD  ranitidine (ZANTAC) 150 MG capsule Take 1 capsule (150 mg total) by mouth 2 (two) times daily. 11/23/15   Margarita Rana, MD     Paticia Stack, PharmD Pharmacy Resident  05/24/2018 12:55 PM

## 2018-05-24 NOTE — ED Notes (Signed)
Pt denies numbness or weakness. Pt states she lost her vision for a moment, pt has hx of migranes. Is complainign of headache 7/10 currently, states she doesn't think this is a migrane.

## 2018-05-24 NOTE — ED Notes (Signed)
Attempted IV access by this RN, and by ED [employer], unable to get access,  IV team consult placed as another RN is not able to do to IV [place] at this time due to other patient commitments.

## 2018-05-24 NOTE — ED Provider Notes (Addendum)
[facility] Emergency Department Provider Note  ____________________________________________  Time seen: Approximately 3:34 PM  I have reviewed the triage vital signs and the nursing notes.   HISTORY  Chief Complaint Near Syncope    HPI Natasha Boyd is a 47 y.o. female with a history of anxiety GERD obesity and hyperlipidemia and migraines who comes to the ED from cardiology clinic for evaluation of an acute headache with blurry vision.  She was seen in the cardiology clinic complaining of chest pain and shortness of breath as well which were deemed to be atypical and scheduled for outpatient stress test.  However due to her potential neurologic symptoms she was referred to ED for further evaluation.  Patient also reported to have a brief complete loss of vision  which is resolved.  She denies any focal paresthesia or weakness.  No recent trauma.  No fevers chills neck pain or stiffness.  Patient does not believe she passed out when symptoms started at 10:30 AM today since she was sitting in a chair and did not fall over during her brief episode of vision loss..     Past Medical History:  Diagnosis Date  . Abdominal pain   . Allergic rhinitis   . Anginal pain ([place])   . Anxiety   . Benign neoplasm of skin   . Dyspnea   . GERD (gastroesophageal reflux disease)   . Heart murmur   . Obesity   . Polydipsia      Patient Active Problem List   Diagnosis Date Noted  . Chest pain 11/17/2015  . Pneumonia 11/03/2015  . Murmur, cardiac 11/03/2015  . Adult-onset obesity 03/16/2015  . Allergic rhinitis 03/16/2015  . Anxiety 03/16/2015  . Abnormal liver enzymes 03/16/2015  . Acid reflux 03/16/2015  . Blood glucose elevated 03/16/2015  . Hypertriglyceridemia 03/16/2015  . LBP (low back pain) 03/16/2015  . Apnea, sleep 03/16/2015  . Screening-pulmonary TB 03/16/2015  . Immunity status testing 03/16/2015  . Flu vaccine need 03/16/2015  . ADD (attention  deficit disorder) 12/15/2014  . Benign neoplasm of skin 02/13/2014     Past Surgical History:  Procedure Laterality Date  . ABDOMINAL HYSTERECTOMY  2011  . APPENDECTOMY  1996?  . CESAREAN SECTION  2007  . ESOPHAGOGASTRODUODENOSCOPY (EGD) WITH PROPOFOL N/A 03/03/2016   Procedure: ESOPHAGOGASTRODUODENOSCOPY (EGD) WITH PROPOFOL;  Surgeon: Manya Silvas, MD;  Location[facility] ENDOSCOPY;  Service: Endoscopy;  Laterality: N/A;  . TONSILLECTOMY  2010     Prior to Admission medications   Medication Sig Start Date End Date Taking? Authorizing Provider  ALPRAZolam (XANAX) 0.25 MG tablet Take 1 tablet (0.25 mg total) by mouth 3 (three) times daily as needed for anxiety. 01/29/18  Yes Jerrol Banana., MD  amLODipine (NORVASC) 5 MG tablet Take 1 tablet by mouth daily. 05/24/18 05/24/19 Yes [provider]  escitalopram (LEXAPRO) 20 MG tablet TAKE 1 TABLET BY MOUTH DAILY 02/07/18  Yes Jerrol Banana., MD  fluticasone [facility]) 50 MCG/ACT nasal spray Place into the nose. Reported on 11/23/2015 10/01/14  Yes [provider]  lansoprazole (PREVACID) 30 MG capsule Take 1 capsule (30 mg total) by mouth daily at 12 noon. 11/23/15  Yes Margarita Rana, MD  OVER THE COUNTER MEDICATION Apply 1 application topically daily as needed (Variety of Essential oils).   Yes [provider]  ranitidine (ZANTAC) 150 MG capsule Take 1 capsule (150 mg total) by mouth 2 (two) times daily. 11/23/15  Yes Margarita Rana, MD  Allergies Iodine; Ivp dye [iodinated diagnostic agents]; and Mushroom extract complex   Family History  Adopted: Yes  Family history unknown: Yes    Social History Social History   Tobacco Use  . Smoking status: Never Smoker  . Smokeless tobacco: Never Used  Substance Use Topics  . Alcohol use: Yes    Comment: Occasionally  . Drug use: No    Review of Systems  Constitutional:   No fever or chills.  ENT:   No sore throat. No rhinorrhea. Cardiovascular:  Positive atypical chest pain without syncope. Respiratory:   No dyspnea or cough. Gastrointestinal:   Negative for abdominal pain, vomiting and diarrhea.  Musculoskeletal:   Negative for focal pain or swelling All other systems reviewed and are negative except as documented above in ROS and HPI.  ____________________________________________   PHYSICAL EXAM:  VITAL SIGNS: ED Triage Vitals  Enc Vitals Group     BP 05/24/18 1306 (!) 157/101     Pulse Rate 05/24/18 1306 71     Resp 05/24/18 1306 14     Temp --      Temp src --      SpO2 05/24/18 1306 100 %     Weight 05/24/18 1527 255 lb (115.7 kg)     Height 05/24/18 1527 5\' 6"  (1.676 m)     Head Circumference --      Peak Flow --      Pain Score --      Pain Loc --      Pain Edu? --      Excl. in [place]? --     Vital signs reviewed, nursing assessments reviewed.   Constitutional:   Alert and oriented. Non-toxic appearance. Eyes:   Conjunctivae are normal. EOMI. PERRL. ENT      Head:   Normocephalic and atraumatic.      Nose:   No congestion/rhinnorhea.       Mouth/Throat:   MMM, no pharyngeal erythema. No peritonsillar mass.       Neck:   No meningismus. Full ROM. Hematological/Lymphatic/Immunilogical:   No cervical lymphadenopathy. Cardiovascular:   RRR. Symmetric bilateral radial and DP pulses.  No murmurs. Cap refill less than 2 seconds. Respiratory:   Normal respiratory effort without tachypnea/retractions. Breath sounds are clear and equal bilaterally. No wheezes/rales/rhonchi. Gastrointestinal:   Soft and nontender. Non distended. There is no CVA tenderness.  No rebound, rigidity, or guarding. Musculoskeletal:   Normal range of motion in all extremities. No joint effusions.  No lower extremity tenderness.  No edema. Neurologic:   Normal speech and language.  Motor grossly intact. No acute focal neurologic deficits are appreciated.  Skin:    Skin is warm, dry and intact. No rash noted.  No petechiae, purpura, or  bullae.  ____________________________________________    LABS (pertinent positives/negatives) (all labs ordered are listed, but only abnormal results are displayed) Labs Reviewed  COMPREHENSIVE METABOLIC PANEL - Abnormal; Notable for the following components:      Result Value   ALT 51 (*)    All other components within normal limits  GLUCOSE, CAPILLARY  PROTIME-INR  APTT  CBC  DIFFERENTIAL  TROPONIN I  CBG MONITORING, ED   ____________________________________________   EKG  Interpreted by me Sinus rhythm rate of 74, normal axis and intervals.  Normal QRS ST segments and T waves.  Nonischemic.  No evidence of dysrhythmia  ____________________________________________    RADIOLOGY  Ct Head Code Stroke Wo Contrast  Result Date: 05/24/2018 CLINICAL DATA:  Code  stroke. Visual disturbance and headache beginning today. EXAM: CT HEAD WITHOUT CONTRAST TECHNIQUE: Contiguous axial images were obtained from the base of the skull through the vertex without intravenous contrast. COMPARISON:  CT 07/21/2010 and MRI 08/14/2010. FINDINGS: Brain: Normal appearance without evidence of malformation, atrophy, old or acute infarction, mass lesion, hemorrhage, hydrocephalus or extra-axial collection. Vascular: Normal Skull: Normal Sinuses/Orbits: Normal Other: None ASPECTS ([place] Stroke Program Early CT Score) - Ganglionic level infarction (caudate, lentiform nuclei, internal capsule, insula, M1-M3 cortex): 7 - Supraganglionic infarction (M4-M6 cortex): 3 Total score (0-10 with 10 being normal): 10 IMPRESSION: 1. Normal head CT 2. ASPECTS is 10. 3. These results were called by telephone at the time of interpretation on 05/24/2018 at 1:01 pm to Dr. Conni Slipper , who verbally acknowledged these results. Electronically Signed   By: Nelson Chimes M.D.   On: 05/24/2018 13:03     ____________________________________________   PROCEDURES Procedures  ____________________________________________  DIFFERENTIAL DIAGNOSIS   TIA, intracranial tumor, atypical headache, intracranial hemorrhage  CLINICAL IMPRESSION / ASSESSMENT AND PLAN / ED COURSE  Pertinent labs & imaging results that were available during my care of the patient were reviewed by me and considered in my medical decision making (see chart for details).    Patient presents from cardiology clinic with bilateral frontal headache, retro-orbital, associated with a transient vision loss earlier today at about 10:30 AM.  Does note that she is been feeling very stressed recently due to the recent death of her father which she feels may have contributed to her symptoms.  Does not sound consistent with a thunderclap headache.  Prior to arrival in the ED she went to cardiology clinic where she saw Dr. Ubaldo Glassing, who felt that her near syncope symptoms were not overly concerning and scheduled outpatient stress test.  He did feel that she needed to be seen in the ED for her headache and vision change.  On arrival to the ED, neurology also assessed her.  CT scan was unremarkable.  Per my discussion with the neurology team in the ED after their evaluation of the patient, they found an NIH stroke scale of 0 and recommended MRI/MRA.  If MRIs were unremarkable, they indicated the patient would not need to be admitted for further stroke work-up.  ----------------------------------------- 3:57 PM on 05/24/2018 -----------------------------------------  MRIs are normal.  Exam is nonfocal other than bilateral frontal headache.  At this point, without any evidence of stroke, cardiology plan in place and no signs of A. fib, no clear benefit to hospitalization.  Neurology paged for follow-up discussion, plan to discharge home.  Patient counseled to return to the ED immediately if she has worsening symptoms including sudden worsening  of headache or vision loss.  Blood pressure now 140/80.      ____________________________________________   FINAL CLINICAL IMPRESSION(S) / ED DIAGNOSES    Final diagnoses:  Acute nonintractable headache, unspecified headache type  Atypical chest pain     ED Discharge Orders    None      Portions of this note were generated with dragon dictation software. Dictation errors may occur despite best attempts at proofreading.    Carrie Mew, MD 05/24/18 1600    Carrie Mew, MD 05/24/18 1604    Carrie Mew, MD 05/24/18 1604    Carrie Mew, MD 05/24/18 [phone]

## 2018-05-24 NOTE — ED Triage Notes (Signed)
Pt went to Dr Ubaldo Glassing and he request MRI

## 2018-05-24 NOTE — Code Documentation (Signed)
Pt arrives from work with complaints of sudden bilateral vision loss, states the vision loss lasted 1 minute and then her vision returned but blurry, hx of mitral valve issues per patient, pt went to her cardiologist who then sent her to the ED, code stroke activated upon arrival to ED, after CT pt taken to room 8, initial NIHSS 0, no tPA due to resolving symptoms, Dr. Mitchel Honour at bedside, Benjamine Mola NP at bedside, report off to [facility], difficulty obtaining labs and IV line, ultrasound IV requested.

## 2018-05-24 NOTE — ED Triage Notes (Signed)
Pt presens POV for dizziness and Blurred vision. Pt RN at school took BP and 170/102 and RN sent pt here.

## 2018-05-24 NOTE — Consult Note (Signed)
Referring Physician: Stafford,P    Chief Complaint: Bilateral vision loss  HPI: Natasha Boyd is an 47 y.o. female with past medical history of moderate aortic stenosis, mitral valve prolapse, anxiety, palpitations, allergic rhinitis, morbid obesity, migraine headache with aura and other abnormal glucose levels presenting to the ED from cardiologist office with complaints of bilateral vision loss and possible syncope episode even though patient denies this?.She initially presented to her cardiologist office with complaints of chest pain, shortness of breath, headache and blurred vision. Her cardiologist sent her to the ED for further evaluation. Patient states that she was at work  around 10:30 am when she suddenly lost vision in both eyes. Patient describes episode as painless loss of vision  without associated vertigo/dizziness. No symptoms of eye redness or pain and tearing associated with visual loss (intermittent angle closure glaucoma). Patient states nothing seem to have precipitated episode such as postural changes or exercise, loss of vision when eyes are moved into certain positions of gaze (gaze-evoked amaurosis) or loss of vision after exercise or a hot shower (Uhthoff's symptom) to suggest demyelinating disease of the optic nerve. Symptoms lasted 1 minute and when she regained her vision, she noticed that they were blurred. Patient state that she checked her blood pressure and noticed that it was elevated. Denies associated altered sensorium, speech abnormality, cranial nerve deficit, seizures, focal motor or sensory deficits, diplopia, or vomiting, ipsilateral or contralateral paralysis/weakness, numbness or tingling, involuntary movements, tremor. He denies history of head injury, trauma, recent infection or new medication. On arrival to the ED, her vision had improved. Code stroke was initiated and patient evaluated by neurology team. Initial NIH stroke scale was 0. She had CT head which did  not show acute intracranial abnormality. Patient was not deemed candidate for IV alteplase due to NIH stroke scale of 0 and resolving symptoms without focal neurological deficit.  Date last known well: Date: 05/24/2018 Time last known well: Time: 10:30 tPA Given: No:NIH stroke scale of 0 and resolving symptoms without focal neurological deficit.  Past Medical History:  Diagnosis Date  . Abdominal pain   . Allergic rhinitis   . Anginal pain ([place])   . Anxiety   . Benign neoplasm of skin   . Dyspnea   . GERD (gastroesophageal reflux disease)   . Heart murmur   . Obesity   . Polydipsia     Past Surgical History:  Procedure Laterality Date  . ABDOMINAL HYSTERECTOMY  2011  . APPENDECTOMY  1996?  . CESAREAN SECTION  2007  . ESOPHAGOGASTRODUODENOSCOPY (EGD) WITH PROPOFOL N/A 03/03/2016   Procedure: ESOPHAGOGASTRODUODENOSCOPY (EGD) WITH PROPOFOL;  Surgeon: Manya Silvas, MD;  Location: [facility] ENDOSCOPY;  Service: Endoscopy;  Laterality: N/A;  . TONSILLECTOMY  2010    Family History  Adopted: Yes  Family history unknown: Yes   Social History:  reports that she has never smoked. She has never used smokeless tobacco. She reports that she drinks alcohol. She reports that she does not use drugs.  Allergies:  Allergies  Allergen Reactions  . Iodine Rash    Anaphylaxis if injected.   Clementeen Hoof [Iodinated Diagnostic Agents] Anaphylaxis  . Mushroom Extract Complex Anaphylaxis    Medications: I have reviewed the patient's current medications. Prior to Admission:  (Not in a hospital admission) Scheduled:  ROS: History obtained from the patient   General ROS: negative for - chills, fatigue, fever, night sweats, weight gain or weight loss Psychological ROS: negative for - behavioral disorder, hallucinations, memory  difficulties, mood swings or suicidal ideation Ophthalmic ROS: Positive for - blurry vision and  loss of vision. Negative for - double vision, eye pain  ENT ROS: negative  for - epistaxis, nasal discharge, oral lesions, sore throat, tinnitus or vertigo Allergy and Immunology ROS: negative for - hives or itchy/watery eyes Hematological and Lymphatic ROS: negative for - bleeding problems, bruising or swollen lymph nodes Endocrine ROS: negative for - galactorrhea, hair pattern changes, polydipsia/polyuria or temperature intolerance Respiratory ROS: negative for - cough, hemoptysis, shortness of breath or wheezing Cardiovascular ROS: Positive for - chest pain, dyspnea on exertion, palpitations. Negative for edema or irregular heartbeat Gastrointestinal ROS: negative for - abdominal pain, diarrhea, hematemesis, nausea/vomiting or stool incontinence Genito-Urinary ROS: negative for - dysuria, hematuria, incontinence or urinary frequency/urgency Musculoskeletal ROS: negative for - joint swelling or muscular weakness Neurological ROS: as noted in HPI Dermatological ROS: negative for rash and skin lesion changes  Physical Examination: Blood pressure (!) 157/101, pulse 71, resp. rate 14, SpO2 100 %.   HEENT-  Normocephalic, no lesions, without obvious abnormality.  Normal external eye and conjunctiva.  Normal TM's bilaterally.  Normal auditory canals and external ears. Normal external nose, mucus membranes and septum.  Normal pharynx. Cardiovascular- S1, S2 normal, pulses palpable throughout   Lungs- chest clear, no wheezing, rales, normal symmetric air entry Abdomen- soft, non-tender; bowel sounds normal; no masses,  no organomegaly Extremities- no edema Lymph-no adenopathy palpable Musculoskeletal-no joint tenderness, deformity or swelling Skin-warm and dry, no hyperpigmentation, vitiligo, or suspicious lesions  Neurological Exam   Mental Status: Alert, oriented, thought content appropriate.  Speech fluent without evidence of aphasia.  Able to follow 3 step commands without difficulty. Attention span and concentration seemed appropriate  Cranial Nerves: II: Discs  flat bilaterally; Visual fields grossly normal, pupils equal, round, reactive to light and accommodation III,IV, VI: ptosis not present, extra-ocular motions intact bilaterally V,VII: smile symmetric, facial light touch sensation intact VIII: hearing normal bilaterally IX,X: gag reflex present XI: bilateral shoulder shrug XII: midline tongue extension Motor: Right :  Upper extremity   5/5 Without pronator drift      Left: Upper extremity   5/5 without pronator drift Right:   Lower extremity   5/5                                          Left: Lower extremity   5/5 Tone and bulk:normal tone throughout; no atrophy noted Sensory: Pinprick and light touch intact bilaterally Deep Tendon Reflexes: 2+ and symmetric throughout Plantars: Right: downgoing                              Left: downgoing Cerebellar: Finger-to-nose testing intact bilaterally. Heel to shin testing normal bilaterally Gait: not tested due to safety concerns  Data Reviewed  Laboratory Studies:  Basic Metabolic Panel: No results for input(s): NA, K, CL, CO2, GLUCOSE, BUN, CREATININE, CALCIUM, MG, PHOS in the last 168 hours.  Liver Function Tests: No results for input(s): AST, ALT, ALKPHOS, BILITOT, PROT, ALBUMIN in the last 168 hours. No results for input(s): LIPASE, AMYLASE in the last 168 hours. No results for input(s): AMMONIA in the last 168 hours.  CBC: No results for input(s): WBC, NEUTROABS, HGB, HCT, MCV, PLT in the last 168 hours.  Cardiac Enzymes: No results for input(s): CKTOTAL, CKMB, CKMBINDEX, TROPONINI  in the last 168 hours.  BNP: Invalid input(s): POCBNP  CBG: Recent Labs  Lab 05/24/18 1245  GLUCAP 79    Microbiology: Results for orders placed or performed during the hospital encounter of 10/03/15  Rapid Influenza A&B Antigens ([place] only)     Status: None   Collection Time: 10/03/15 10:46 AM  Result Value Ref Range Status   Influenza A ([facility]) NEGATIVE NEGATIVE Final   Influenza B ([facility])  NEGATIVE NEGATIVE Final    Coagulation Studies: No results for input(s): LABPROT, INR in the last 72 hours.  Urinalysis: No results for input(s): COLORURINE, LABSPEC, PHURINE, GLUCOSEU, HGBUR, BILIRUBINUR, KETONESUR, PROTEINUR, UROBILINOGEN, NITRITE, LEUKOCYTESUR in the last 168 hours.  Invalid input(s): APPERANCEUR  Lipid Panel:    Component Value Date/Time   CHOL 223 (H) 12/12/2017 0821   TRIG 155 (H) 12/12/2017 0821   HDL 43 12/12/2017 0821   CHOLHDL 5.5 11/18/2015 0437   VLDL 34 11/18/2015 0437   LDLCALC 149 (H) 12/12/2017 0821    HgbA1C:  Lab Results  Component Value Date   HGBA1C 5.7 08/28/2014    Urine Drug Screen:  No results found for: LABOPIA, COCAINSCRNUR, LABBENZ, AMPHETMU, THCU, LABBARB  Alcohol Level: No results for input(s): ETH in the last 168 hours.  Other results: EKG: normal EKG, normal sinus rhythm, unchanged from previous tracings. Vent. rate 74 BPM PR interval * ms QRS duration 88 ms QT/QTc 399/443 ms P-R-T axes 37 76 22 Imaging: Ct Head Code Stroke Wo Contrast  Result Date: 05/24/2018 CLINICAL DATA:  Code stroke. Visual disturbance and headache beginning today. EXAM: CT HEAD WITHOUT CONTRAST TECHNIQUE: Contiguous axial images were obtained from the base of the skull through the vertex without intravenous contrast. COMPARISON:  CT 07/21/2010 and MRI 08/14/2010. FINDINGS: Brain: Normal appearance without evidence of malformation, atrophy, old or acute infarction, mass lesion, hemorrhage, hydrocephalus or extra-axial collection. Vascular: Normal Skull: Normal Sinuses/Orbits: Normal Other: None ASPECTS ([place] Stroke Program Early CT Score) - Ganglionic level infarction (caudate, lentiform nuclei, internal capsule, insula, M1-M3 cortex): 7 - Supraganglionic infarction (M4-M6 cortex): 3 Total score (0-10 with 10 being normal): 10 IMPRESSION: 1. Normal head CT 2. ASPECTS is 10. 3. These results were called by telephone at the time of interpretation on  05/24/2018 at 1:01 pm to Dr. Conni Slipper , who verbally acknowledged these results. Electronically Signed   By: Nelson Chimes M.D.   On: 05/24/2018 13:03   Assessment: 47 y.o. female with past medical history of moderate aortic stenosis, mitral valve prolapse, anxiety, palpitations, allergic rhinitis, morbid obesity, migraine headache with aura and other abnormal glucose levels presenting to the ED from cardiologist office with complaints of bilateral vision loss, blurriness and headache. Initial concerns for posterior circulation stroke however this is unlikely given presentation of bilateral vision loss and headache in a patient with history of migraine headache with aura. CT head reviewed and showed no acute intracranial abnormality. Patient state she has hx of allergic reaction to contrast dye, therefore was unable to obtain CTA head and neck for further evaluation. Patient report that she was not on any antiplatelet or anticoagulation prior to this episode.  Stroke Risk Factors - hypertension  Plan: 1. HgbA1c, fasting lipid panel 2. MRI of the brain without contrast 3. MRA head and neck without contrast 4. Echocardiogram 5. Prophylactic therapy-Antiplatelet med: Aspirin - dose 81 mg/day 6. NPO until RN stroke swallow screen 7. Telemetry monitoring 8. Frequent neuro checks  This patient was staffed with Dr. Arnaldo Natal who personally evaluated  patient, reviewed documentation and agreed with assessment and plan of care as above.  Rufina Falco, DNP, FNP-BC Board certified Nurse Practitioner Neurology Department   05/24/2018, 1:25 PM

## 2018-05-24 NOTE — ED Notes (Signed)
Patient transported to MRI 

## 2018-05-28 ENCOUNTER — Ambulatory Visit: Payer: BC Managed Care – PPO

## 2018-06-04 ENCOUNTER — Ambulatory Visit: Payer: BC Managed Care – PPO | Admitting: Family Medicine

## 2018-06-04 VITALS — BP 126/82 | HR 83 | Temp 98.0°F | Resp 16 | Ht 66.0 in | Wt 263.0 lb

## 2018-06-04 DIAGNOSIS — Z6841 Body Mass Index (BMI) 40.0 and over, adult: Secondary | ICD-10-CM

## 2018-06-04 DIAGNOSIS — F419 Anxiety disorder, unspecified: Secondary | ICD-10-CM | POA: Diagnosis not present

## 2018-06-04 DIAGNOSIS — G4733 Obstructive sleep apnea (adult) (pediatric): Secondary | ICD-10-CM

## 2018-06-04 DIAGNOSIS — I10 Essential (primary) hypertension: Secondary | ICD-10-CM | POA: Diagnosis not present

## 2018-06-04 MED ORDER — METOPROLOL TARTRATE 25 MG PO TABS: 12.5000 mg | ORAL_TABLET | Freq: Two times a day (BID) | ORAL | 3 refills | Status: DC

## 2018-06-04 NOTE — Progress Notes (Signed)
Natasha Boyd  MRN: [id number] DOB: [birth year]  Subjective:  HPI   The patient is a 47 year old female who presents today for follow up of her anxiety.  She states that this has gotten some better. She was last seen by [place] on 01/29/18.  Since her visit she has been to the cardiologist and also had an acut ER visit earlier this month for syncopal episode with headache secondary to elevated blood pressure. The patient was seen by Dr Ubaldo Glassing that day sent to the ED and has since been started on Amlodipine.  The patient is being followed by [facility] cardiology for her mitral valve stenosis. Patient has been checking her blood pressure at home since starting the Amlodipine and she had been getting readings in the range of 140's over high 90's.  Yesterday her reading was 128/97.  Patient Active Problem List   Diagnosis Date Noted  . Chest pain 11/17/2015  . Pneumonia 11/03/2015  . Murmur, cardiac 11/03/2015  . Adult-onset obesity 03/16/2015  . Allergic rhinitis 03/16/2015  . Anxiety 03/16/2015  . Abnormal liver enzymes 03/16/2015  . Acid reflux 03/16/2015  . Blood glucose elevated 03/16/2015  . Hypertriglyceridemia 03/16/2015  . LBP (low back pain) 03/16/2015  . Apnea, sleep 03/16/2015  . Screening-pulmonary TB 03/16/2015  . Immunity status testing 03/16/2015  . Flu vaccine need 03/16/2015  . ADD (attention deficit disorder) 12/15/2014  . Benign neoplasm of skin 02/13/2014    Past Medical History:  Diagnosis Date  . Abdominal pain   . Allergic rhinitis   . Anginal pain ([place])   . Anxiety   . Benign neoplasm of skin   . Dyspnea   . GERD (gastroesophageal reflux disease)   . Heart murmur   . Obesity   . Polydipsia     Social History   Socioeconomic History  . Marital status: Married    Spouse name: Not on file  . Number of children: Not on file  . Years of education: Not on file  . Highest education level: Not on file  Occupational History  . Not on file  Social Needs  .  Financial resource strain: Not on file  . Food insecurity:    Worry: Not on file    Inability: Not on file  . Transportation needs:    Medical: Not on file    Non-medical: Not on file  Tobacco Use  . Smoking status: Never Smoker  . Smokeless tobacco: Never Used  Substance and Sexual Activity  . Alcohol use: Yes    Comment: Occasionally  . Drug use: No  . Sexual activity: Not on file  Lifestyle  . Physical activity:    Days per week: Not on file    Minutes per session: Not on file  . Stress: Not on file  Relationships  . Social connections:    Talks on phone: Not on file    Gets together: Not on file    Attends religious service: Not on file    Active member of club or organization: Not on file    Attends meetings of clubs or organizations: Not on file    Relationship status: Not on file  . Intimate partner violence:    Fear of current or ex partner: Not on file    Emotionally abused: Not on file    Physically abused: Not on file    Forced sexual activity: Not on file  Other Topics Concern  . Not on file  Social History  Narrative  . Not on file    Outpatient Encounter Medications as of 06/04/2018  Medication Sig Note  . ALPRAZolam (XANAX) 0.25 MG tablet Take 1 tablet (0.25 mg total) by mouth 3 (three) times daily as needed for anxiety.   Marland Kitchen amLODipine (NORVASC) 5 MG tablet Take 1 tablet by mouth daily. 05/24/2018: Pt will start taking as soon as she picks up from pharmacy.  Marland Kitchen escitalopram (LEXAPRO) 20 MG tablet TAKE 1 TABLET BY MOUTH DAILY   . fluticasone (FLONASE) 50 MCG/ACT nasal spray Place into the nose. Reported on 11/23/2015   . lansoprazole (PREVACID) 30 MG capsule Take 1 capsule (30 mg total) by mouth daily at 12 noon.   Marland Kitchen OVER THE COUNTER MEDICATION Apply 1 application topically daily as needed (Variety of Essential oils).   . [DISCONTINUED] ranitidine (ZANTAC) 150 MG capsule Take 1 capsule (150 mg total) by mouth 2 (two) times daily.    No facility-administered  encounter medications on file as of 06/04/2018.     Allergies  Allergen Reactions  . Iodine Rash    Anaphylaxis if injected.   Clementeen Hoof [Iodinated Diagnostic Agents] Anaphylaxis  . Mushroom Extract Complex Anaphylaxis    Review of Systems  Constitutional: Negative for fever and malaise/fatigue.  Respiratory: Positive for shortness of breath (chronic and unchanged). Negative for cough and wheezing.   Cardiovascular: Positive for palpitations. Negative for chest pain and leg swelling.  Skin: Negative.   Neurological: Negative.  Negative for dizziness and headaches.  Endo/Heme/Allergies: Negative.   Psychiatric/Behavioral: Negative.     Objective:  BP 126/82 (BP Location: Right Arm, Patient Position: Sitting, Cuff Size: Large)   Pulse 83   Temp 98 F (36.7 C) (Oral)   Resp 16   Ht 5\' 6"  (1.676 m)   Wt 263 lb (119.3 kg)   SpO2 96%   BMI 42.45 kg/m   Physical Exam  Constitutional: She is oriented to person, place, and time and well-developed, well-nourished, and in no distress.  HENT:  Head: Normocephalic and atraumatic.  Eyes: Conjunctivae are normal. No scleral icterus.  Neck: No thyromegaly present.  Cardiovascular: Normal rate, regular rhythm, normal heart sounds and intact distal pulses.  Pulmonary/Chest: Effort normal and breath sounds normal.  Musculoskeletal:        General: No edema.  Neurological: She is alert and oriented to person, place, and time. Gait normal. GCS score is 15.  Skin: Skin is warm and dry.  Psychiatric: Mood, memory, affect and judgment normal.    Assessment and Plan :  1. Hypertension, unspecified type And beta-blocker for blood pressure and mild tachycardia. - metoprolol tartrate (LOPRESSOR) 25 MG tablet; Take 0.5 tablets (12.5 mg total) by mouth 2 (two) times daily.  Dispense: 15 tablet; Refill: 3  2. Obstructive sleep apnea syndrome   3. Class 3 severe obesity due to excess calories with serious comorbidity and body mass index (BMI)  of 40.0 to 44.9 in adult [facility]) Patient with hypertension OSA and reflux.  Lifestyle stressed. 4.Anxiety Improved.  I have done the exam and reviewed the chart and it is accurate to the best of my knowledge. Development worker, community has been used and  any errors in dictation or transcription are unintentional. Miguel Aschoff M.D. [facility]

## 2018-07-04 ENCOUNTER — Ambulatory Visit (INDEPENDENT_AMBULATORY_CARE_PROVIDER_SITE_OTHER): Payer: Managed Care, Other (non HMO) | Admitting: Family Medicine

## 2018-07-04 ENCOUNTER — Encounter: Payer: Self-pay | Admitting: Family Medicine

## 2018-07-04 VITALS — BP 128/78 | HR 68 | Temp 98.6°F | Resp 16 | Wt 266.0 lb

## 2018-07-04 DIAGNOSIS — E785 Hyperlipidemia, unspecified: Secondary | ICD-10-CM

## 2018-07-04 DIAGNOSIS — K760 Fatty (change of) liver, not elsewhere classified: Secondary | ICD-10-CM

## 2018-07-04 DIAGNOSIS — E669 Obesity, unspecified: Secondary | ICD-10-CM | POA: Diagnosis not present

## 2018-07-04 DIAGNOSIS — Z6841 Body Mass Index (BMI) 40.0 and over, adult: Secondary | ICD-10-CM

## 2018-07-04 DIAGNOSIS — I10 Essential (primary) hypertension: Secondary | ICD-10-CM | POA: Diagnosis not present

## 2018-07-04 NOTE — Progress Notes (Signed)
Patient: Natasha Boyd Female    DOB: June 29, 1970   48 y.o.   MRN: [id number] Visit Date: 07/04/2018  Today's Provider: Wilhemena Durie, MD   Chief Complaint  Patient presents with  . Hypertension   Subjective:     HPI   Hypertension, follow-up:  BP Readings from Last 3 Encounters:  07/04/18 128/78  06/04/18 126/82  05/24/18 (!) 144/89    She was last seen for hypertension 1 months ago.  BP at that visit was 144/89. Management since that visit includes adding Metoprolol 12.5mg   . She reports good compliance with treatment. She is not having side effects.  She is not exercising. She is adherent to low salt diet.   Outside blood pressures are checked daily. She is experiencing none.  Patient denies exertional chest pressure/discomfort, lower extremity edema and palpitations.   Cardiovascular risk factors include dyslipidemia.   Weight trend: stable Wt Readings from Last 3 Encounters:  07/04/18 266 lb (120.7 kg)  06/04/18 263 lb (119.3 kg)  05/24/18 255 lb (115.7 kg)    Current diet: well balanced   Allergies  Allergen Reactions  . Iodine Rash    Anaphylaxis if injected.   Clementeen Hoof [Iodinated Diagnostic Agents] Anaphylaxis  . Mushroom Extract Complex Anaphylaxis     Current Outpatient Medications:  .  ALPRAZolam (XANAX) 0.25 MG tablet, Take 1 tablet (0.25 mg total) by mouth 3 (three) times daily as needed for anxiety., Disp: 60 tablet, Rfl: 4 .  amLODipine (NORVASC) 5 MG tablet, Take 1 tablet by mouth daily., Disp: , Rfl:  .  escitalopram (LEXAPRO) 20 MG tablet, TAKE 1 TABLET BY MOUTH DAILY, Disp: 30 tablet, Rfl: 5 .  fluticasone (FLONASE) 50 MCG/ACT nasal spray, Place into the nose. Reported on 11/23/2015, Disp: , Rfl:  .  lansoprazole (PREVACID) 30 MG capsule, Take 1 capsule (30 mg total) by mouth daily at 12 noon., Disp: 30 capsule, Rfl: 5 .  metoprolol tartrate (LOPRESSOR) 25 MG tablet, Take 0.5 tablets (12.5 mg total) by mouth 2 (two) times  daily., Disp: 15 tablet, Rfl: 3 .  OVER THE COUNTER MEDICATION, Apply 1 application topically daily as needed (Variety of Essential oils)., Disp: , Rfl:   Review of Systems  Constitutional: Negative for activity change, appetite change, chills, diaphoresis, fatigue, fever and unexpected weight change.  HENT: Negative.   Eyes: Negative.   Respiratory: Negative for cough and shortness of breath.   Cardiovascular: Negative for chest pain, palpitations and leg swelling.  Musculoskeletal: Negative for arthralgias.  Neurological: Negative for dizziness, light-headedness and headaches.  Psychiatric/Behavioral: Negative.     Social History   Tobacco Use  . Smoking status: Never Smoker  . Smokeless tobacco: Never Used  Substance Use Topics  . Alcohol use: Yes    Comment: Occasionally      Objective:   BP 128/78 (BP Location: Left Arm, Patient Position: Sitting, Cuff Size: Large)   Pulse 68   Temp 98.6 F (37 C)   Resp 16   Wt 266 lb (120.7 kg)   SpO2 96%   BMI 42.93 kg/m  Vitals:   07/04/18 1628  BP: 128/78  Pulse: 68  Resp: 16  Temp: 98.6 F (37 C)  SpO2: 96%  Weight: 266 lb (120.7 kg)     Physical Exam Constitutional:      Appearance: She is well-developed.  HENT:     Head: Normocephalic and atraumatic.     Right Ear: External ear normal.  Left Ear: External ear normal.     Nose: Nose normal.  Eyes:     General: No scleral icterus.    Conjunctiva/sclera: Conjunctivae normal.  Neck:     Thyroid: No thyromegaly.  Cardiovascular:     Rate and Rhythm: Normal rate and regular rhythm.     Heart sounds: Murmur present.     Comments: Aortic murmur. Pulmonary:     Effort: Pulmonary effort is normal.     Breath sounds: Normal breath sounds.  Abdominal:     Palpations: Abdomen is soft.  Skin:    General: Skin is warm and dry.  Neurological:     General: No focal deficit present.     Mental Status: She is alert and oriented to person, place, and time. Mental  status is at baseline.  Psychiatric:        Mood and Affect: Mood normal.        Behavior: Behavior normal.        Thought Content: Thought content normal.        Judgment: Judgment normal.         Assessment & Plan    1. Hypertension, unspecified type  - Comprehensive Metabolic Panel (CMET)  2. Fatty liver  - Comprehensive Metabolic Panel (CMET)  3. Hyperlipidemia, unspecified hyperlipidemia type  - Lipid Profile    4. Class 3 severe obesity due to excess calories with serious comorbidity and body mass index (BMI) of 40.0 to 44.9 in adult [facility]) Comorbidity of fatty liver and hypertension and hyperlipidemia.  Lifestyle with diet and exercise weight loss is stressed.    Laqueta Linden, MD  [facility]

## 2018-08-16 ENCOUNTER — Other Ambulatory Visit: Payer: Self-pay | Admitting: Family Medicine

## 2018-08-16 DIAGNOSIS — I10 Essential (primary) hypertension: Secondary | ICD-10-CM

## 2018-09-02 ENCOUNTER — Other Ambulatory Visit: Payer: Self-pay | Admitting: Family Medicine

## 2018-09-02 DIAGNOSIS — F419 Anxiety disorder, unspecified: Secondary | ICD-10-CM

## 2018-09-02 MED ORDER — ESCITALOPRAM OXALATE 20 MG PO TABS: 20.0000 mg | ORAL_TABLET | Freq: Every day | ORAL | 5 refills | Status: DC

## 2018-09-02 NOTE — Telephone Encounter (Signed)
[facility] faxed refill request for the following medications:   escitalopram (LEXAPRO) 20 MG tablet   Please advise.  

## 2018-11-29 ENCOUNTER — Other Ambulatory Visit: Payer: Self-pay | Admitting: Family Medicine

## 2018-11-29 DIAGNOSIS — I10 Essential (primary) hypertension: Secondary | ICD-10-CM

## 2018-11-29 NOTE — Telephone Encounter (Signed)
Please review

## 2018-12-24 ENCOUNTER — Ambulatory Visit (INDEPENDENT_AMBULATORY_CARE_PROVIDER_SITE_OTHER): Payer: Managed Care, Other (non HMO) | Admitting: Advanced Practice Midwife

## 2018-12-24 ENCOUNTER — Other Ambulatory Visit: Payer: Self-pay

## 2018-12-24 ENCOUNTER — Encounter: Payer: Self-pay | Admitting: Advanced Practice Midwife

## 2018-12-24 VITALS — BP 122/88 | HR 82 | Ht 66.0 in | Wt 279.0 lb

## 2018-12-24 DIAGNOSIS — N644 Mastodynia: Secondary | ICD-10-CM | POA: Diagnosis not present

## 2018-12-24 DIAGNOSIS — N6315 Unspecified lump in the right breast, overlapping quadrants: Secondary | ICD-10-CM

## 2018-12-24 DIAGNOSIS — N6310 Unspecified lump in the right breast, unspecified quadrant: Secondary | ICD-10-CM

## 2018-12-24 NOTE — Progress Notes (Signed)
Patient ID: Natasha Boyd, female   DOB: Jan 09, 1971, 48 y.o.   MRN: [id number]  Reason for Consult: Breast Pain (Right breast knot x few months)    Subjective:     HPI:  Natasha Boyd is a 48 y.o. female who is being seen for a breast concern. About 2 months ago she started having some general pain across her chest. She thought it might be due to a heart valve issue. Then about a month ago she noticed tenderness in her right breast and a "knot" in the area of tenderness. Her last mammogram was 5 years ago at [facility] and it was normal. Two years prior to that she had a suspicious area on her right breast that was removed and found to be benign.  She also mentions a swollen lymph node in her right neck that has been tender and a lump under her right arm that is tender.   She denies any other concerns at this time.   Past Medical History:  Diagnosis Date  . Abdominal pain   . Allergic rhinitis   . Anginal pain ([place])   . Anxiety   . Benign neoplasm of skin   . Dyspnea   . GERD (gastroesophageal reflux disease)   . Heart murmur   . Obesity   . Polydipsia    Family History  Adopted: Yes  Family history unknown: Yes   Past Surgical History:  Procedure Laterality Date  . ABDOMINAL HYSTERECTOMY  2011  . APPENDECTOMY  1996?  . CESAREAN SECTION  2007  . ESOPHAGOGASTRODUODENOSCOPY (EGD) WITH PROPOFOL N/A 03/03/2016   Procedure: ESOPHAGOGASTRODUODENOSCOPY (EGD) WITH PROPOFOL;  Surgeon: Manya Silvas, MD;  Location: [facility] ENDOSCOPY;  Service: Endoscopy;  Laterality: N/A;  . TONSILLECTOMY  2010    Short Social History:  Social History   Tobacco Use  . Smoking status: Never Smoker  . Smokeless tobacco: Never Used  Substance Use Topics  . Alcohol use: Yes    Comment: Occasionally    Allergies  Allergen Reactions  . Iodine Rash    Anaphylaxis if injected.   Clementeen Hoof [Iodinated Diagnostic Agents] Anaphylaxis  . Mushroom Extract Complex Anaphylaxis    Current  Outpatient Medications  Medication Sig Dispense Refill  . amLODipine (NORVASC) 5 MG tablet Take 1 tablet by mouth daily.    Marland Kitchen escitalopram (LEXAPRO) 20 MG tablet Take 1 tablet (20 mg total) by mouth daily. 30 tablet 5  . lansoprazole (PREVACID) 30 MG capsule Take 1 capsule (30 mg total) by mouth daily at 12 noon. 30 capsule 5  . metoprolol tartrate (LOPRESSOR) 25 MG tablet TAKE 1/2 TABLET(12.5 MG) BY MOUTH TWICE DAILY 15 tablet 5  . fluticasone (FLONASE) 50 MCG/ACT nasal spray Place into the nose. Reported on 11/23/2015    . OVER THE COUNTER MEDICATION Apply 1 application topically daily as needed (Variety of Essential oils).     No current facility-administered medications for this visit.     Review of Systems  Constitutional: Negative.   HENT: Negative.   Eyes: Negative.   Respiratory: Negative.   Cardiovascular: Positive for chest pain.  Gastrointestinal: Negative.   Genitourinary: Negative.   Musculoskeletal: Negative.   Skin: Negative.   Neurological: Negative.   Endo/Heme/Allergies: Negative.   Psychiatric/Behavioral: Negative.   Breast: lump and tenderness in right breast Lymph: swelling and tenderness in right neck and right axilla     Objective:  Objective   Vitals:   12/24/18 1611  BP: 122/88  Pulse:  82  Weight: 279 lb (126.6 kg)  Height: 5\' 6"  (1.676 m)   Body mass index is 45.03 kg/m.  Vital Signs: BP 122/88   Pulse 82   Ht 5\' 6"  (1.676 m)   Wt 279 lb (126.6 kg)   BMI 45.03 kg/m  Constitutional: Well nourished, well developed female in no acute distress.  HEENT: normal Skin: Warm and dry.  Cardiovascular: Regular rate and rhythm.   Respiratory: Clear to auscultation bilateral. Normal respiratory effort Neuro: DTRs 2+, Cranial nerves grossly intact Psych: Alert and Oriented x3. No memory deficits. Normal mood and affect.  MS: normal gait, normal bilateral lower extremity ROM/strength/stability. Breast: right breast with 1 cm fibrous feeling knot 4.5 cm  from nipple at 3 o'clock, tender to palpation Lymph: right neck with 1 cm x 1 cm tender swollen lymph node, right axilla with large approximately 8 cm x 5 cm swollen tender area  Pelvic exam: deferred  Data: none     Assessment/Plan:     48 yo G2 P2 female with right breast lump and pain, other areas of swollen lymph nodes  Diagnostic mammogram and bilateral breast ultrasounds ordered Schedule appointment with PCP to evaluate other swollen lymph nodes   [facility] 12/24/2018, 5:07 PM

## 2018-12-30 ENCOUNTER — Encounter: Payer: Self-pay | Admitting: Family Medicine

## 2018-12-30 ENCOUNTER — Ambulatory Visit (INDEPENDENT_AMBULATORY_CARE_PROVIDER_SITE_OTHER): Payer: Managed Care, Other (non HMO) | Admitting: Family Medicine

## 2018-12-30 ENCOUNTER — Other Ambulatory Visit: Payer: Self-pay

## 2018-12-30 VITALS — BP 123/81 | HR 77 | Temp 98.6°F | Wt 280.6 lb

## 2018-12-30 DIAGNOSIS — R2230 Localized swelling, mass and lump, unspecified upper limb: Secondary | ICD-10-CM

## 2018-12-30 DIAGNOSIS — N644 Mastodynia: Secondary | ICD-10-CM | POA: Diagnosis not present

## 2018-12-30 DIAGNOSIS — R079 Chest pain, unspecified: Secondary | ICD-10-CM | POA: Diagnosis not present

## 2018-12-30 DIAGNOSIS — E669 Obesity, unspecified: Secondary | ICD-10-CM

## 2018-12-30 DIAGNOSIS — I10 Essential (primary) hypertension: Secondary | ICD-10-CM

## 2018-12-30 DIAGNOSIS — R222 Localized swelling, mass and lump, trunk: Secondary | ICD-10-CM | POA: Diagnosis not present

## 2018-12-30 MED ORDER — METOPROLOL TARTRATE 25 MG PO TABS: 12.5000 mg | ORAL_TABLET | Freq: Two times a day (BID) | ORAL | 5 refills | Status: AC

## 2018-12-30 NOTE — Progress Notes (Signed)
Patient: Natasha Boyd Female    DOB: [birth year]   48 y.o.   MRN: [id number] Visit Date: 12/30/2018  Today's Provider: Wilhemena Durie, MD   Chief Complaint  Patient presents with  . Follow-up   Subjective:    HPI Patient was seen by GYN for cysts in her right breast on 12/24/2018. It was also noted that she had swollen lymph nodes under arm &neck per patient that needed to be evaluated. She is here to discuss this today.   Allergies  Allergen Reactions  . Iodine Rash    Anaphylaxis if injected.   Clementeen Hoof [Iodinated Diagnostic Agents] Anaphylaxis  . Mushroom Extract Complex Anaphylaxis     Current Outpatient Medications:  .  amLODipine (NORVASC) 5 MG tablet, Take 1 tablet by mouth daily., Disp: , Rfl:  .  escitalopram (LEXAPRO) 20 MG tablet, Take 1 tablet (20 mg total) by mouth daily., Disp: 30 tablet, Rfl: 5 .  fluticasone (FLONASE) 50 MCG/ACT nasal spray, Place into the nose. Reported on 11/23/2015, Disp: , Rfl:  .  lansoprazole (PREVACID) 30 MG capsule, Take 1 capsule (30 mg total) by mouth daily at 12 noon., Disp: 30 capsule, Rfl: 5 .  metoprolol tartrate (LOPRESSOR) 25 MG tablet, TAKE 1/2 TABLET(12.5 MG) BY MOUTH TWICE DAILY, Disp: 15 tablet, Rfl: 5 .  OVER THE COUNTER MEDICATION, Apply 1 application topically daily as needed (Variety of Essential oils)., Disp: , Rfl:   Review of Systems  Constitutional: Negative.   Eyes: Negative.   Respiratory: Negative.   Cardiovascular: Positive for chest pain.  Gastrointestinal: Negative.   Endocrine: Negative.   Genitourinary: Negative.   Allergic/Immunologic: Negative.   Neurological: Negative.   Psychiatric/Behavioral: Negative.     Social History   Tobacco Use  . Smoking status: Never Smoker  . Smokeless tobacco: Never Used  Substance Use Topics  . Alcohol use: Yes    Comment: Occasionally      Objective:   BP 123/81 (BP Location: Right Arm, Patient Position: Sitting, Cuff Size: Large)   Pulse 77    Temp 98.6 F (37 C) (Oral)   Wt 280 lb 9.6 oz (127.3 kg)   BMI 45.29 kg/m  Vitals:   12/30/18 0847  BP: 123/81  Pulse: 77  Temp: 98.6 F (37 C)  TempSrc: Oral  Weight: 280 lb 9.6 oz (127.3 kg)     Physical Exam Vitals signs reviewed.  Constitutional:      Appearance: She is obese.  HENT:     Head: Normocephalic and atraumatic.     Right Ear: External ear normal.     Left Ear: External ear normal.     Nose: Nose normal.  Eyes:     General: No scleral icterus.    Conjunctiva/sclera: Conjunctivae normal.  Cardiovascular:     Rate and Rhythm: Normal rate and regular rhythm.     Heart sounds: Normal heart sounds.  Pulmonary:     Effort: Pulmonary effort is normal.     Breath sounds: Normal breath sounds.  Chest:    Abdominal:     Palpations: Abdomen is soft.  Skin:    General: Skin is warm and dry.  Neurological:     General: No focal deficit present.     Mental Status: She is alert and oriented to person, place, and time.  Psychiatric:        Mood and Affect: Mood normal.        Behavior: Behavior normal.  Thought Content: Thought content normal.        Judgment: Judgment normal.   ECG reveals NSR--T wave now upright in V1.   No results found for any visits on 12/30/18.     Assessment & Plan    I, Porsha McClurkin CMA, am acting as a scribe for Wilhemena Durie., MD.    1. Hypertension, unspecified type Controlled. - metoprolol tartrate (LOPRESSOR) 25 MG tablet; Take 0.5 tablets (12.5 mg total) by mouth 2 (two) times daily.  Dispense: 30 tablet; Refill: 5  2. Chest pain, unspecified type Nonspecific and does not sound cardiac. Lifestyle discussed. - EKG 12-Lead  3. Breast tenderness in female Mammogram pending. - Ambulatory referral to General Surgery  4. Axillary fullness Considered [place] but will refer to surgery for opinion. No other adenopathy noted on exam. 5.Morbid obesity  Wilhemena Durie, MD  [facility]

## 2019-01-02 ENCOUNTER — Ambulatory Visit: Payer: Managed Care, Other (non HMO) | Admitting: General Surgery

## 2019-01-02 ENCOUNTER — Other Ambulatory Visit: Payer: Self-pay

## 2019-01-02 ENCOUNTER — Encounter: Payer: Self-pay | Admitting: General Surgery

## 2019-01-02 VITALS — BP 128/83 | HR 77 | Temp 97.3°F | Resp 14 | Ht 66.0 in | Wt 279.0 lb

## 2019-01-02 DIAGNOSIS — M940 Chondrocostal junction syndrome [Tietze]: Secondary | ICD-10-CM

## 2019-01-02 NOTE — Patient Instructions (Signed)
Follow up as needed.   Tietze Syndrome   Costochondritis Costochondritis is swelling and irritation (inflammation) of the tissue (cartilage) that connects your ribs to your breastbone (sternum). This causes pain in the front of your chest. Usually, the pain:  Starts gradually.  Is in more than one rib. This condition usually goes away on its own over time. Follow these instructions at home:  Do not do anything that makes your pain worse.  If directed, put ice on the painful area: ? Put ice in a plastic bag. ? Place a towel between your skin and the bag. ? Leave the ice on for 20 minutes, 2-3 times a day.  If directed, put heat on the affected area as often as told by your doctor. Use the heat source that your doctor tells you to use, such as a moist heat pack or a heating pad. ? Place a towel between your skin and the heat source. ? Leave the heat on for 20-30 minutes. ? Take off the heat if your skin turns bright red. This is very important if you cannot feel pain, heat, or cold. You may have a greater risk of getting burned.  Take over-the-counter and prescription medicines only as told by your doctor.  Return to your normal activities as told by your doctor. Ask your doctor what activities are safe for you.  Keep all follow-up visits as told by your doctor. This is important. Contact a doctor if:  You have chills or a fever.  Your pain does not go away or it gets worse.  You have a cough that does not go away. Get help right away if:  You are short of breath. This information is not intended to replace advice given to you by your health care provider. Make sure you discuss any questions you have with your health care provider. Document Released: 11/22/2007 Document Revised: 06/20/2017 Document Reviewed: 09/29/2015 Elsevier Patient Education  2020 [employer].

## 2019-01-02 NOTE — Progress Notes (Unsigned)
Patient ID: Natasha Boyd, female   DOB: 11-02-1970, 48 y.o.   MRN: [id number]  Chief Complaint  Patient presents with  . other    mastalgia    HPI Natasha Boyd is a 48 y.o. female last seen in 2015, here for evaluation of right breast tenderness and mass. Her last mammogram was 3 years ago at [facility]. She is scheduled for a mammogram tomorrow at [place]. She reports that they found a possible mass in the right breast and also right axillary lymph node swelling at [facility]. She reports that she did feel a mass in the right breast near the sternum. She reports that she also has some other lymph node swelling found on exam. She denies any changes in skin color or nipple drainage.  HPI  Past Medical History:  Diagnosis Date  . Abdominal pain   . Allergic rhinitis   . Anginal pain ([place])   . Anxiety   . Benign neoplasm of skin   . Dyspnea   . GERD (gastroesophageal reflux disease)   . Heart murmur   . Obesity   . Polydipsia     Past Surgical History:  Procedure Laterality Date  . ABDOMINAL HYSTERECTOMY  2011  . APPENDECTOMY  1996?  . CESAREAN SECTION  2007  . ESOPHAGOGASTRODUODENOSCOPY (EGD) WITH PROPOFOL N/A 03/03/2016   Procedure: ESOPHAGOGASTRODUODENOSCOPY (EGD) WITH PROPOFOL;  Surgeon: Manya Silvas, MD;  Location: [facility] ENDOSCOPY;  Service: Endoscopy;  Laterality: N/A;  . TONSILLECTOMY  2010    Family History  Adopted: Yes  Family history unknown: Yes    Social History Social History   Tobacco Use  . Smoking status: Never Smoker  . Smokeless tobacco: Never Used  Substance Use Topics  . Alcohol use: Yes    Comment: Occasionally  . Drug use: No    Allergies  Allergen Reactions  . Iodine Rash    Anaphylaxis if injected.   Clementeen Hoof [Iodinated Diagnostic Agents] Anaphylaxis  . Mushroom Extract Complex Anaphylaxis    Current Outpatient Medications  Medication Sig Dispense Refill  . amLODipine (NORVASC) 5 MG tablet Take 1 tablet by mouth daily.     Marland Kitchen escitalopram (LEXAPRO) 20 MG tablet Take 1 tablet (20 mg total) by mouth daily. 30 tablet 5  . fluticasone (FLONASE) 50 MCG/ACT nasal spray Place into the nose. Reported on 11/23/2015    . lansoprazole (PREVACID) 30 MG capsule Take 1 capsule (30 mg total) by mouth daily at 12 noon. 30 capsule 5  . metoprolol tartrate (LOPRESSOR) 25 MG tablet Take 0.5 tablets (12.5 mg total) by mouth 2 (two) times daily. 30 tablet 5  . OVER THE COUNTER MEDICATION Apply 1 application topically daily as needed (Variety of Essential oils).     No current facility-administered medications for this visit.     Review of Systems Review of Systems  Constitutional: Negative.   Respiratory: Negative.   Cardiovascular: Negative.     Blood pressure 128/83, pulse 77, temperature (!) 97.3 F (36.3 C), resp. rate 14, height 5\' 6"  (1.676 m), weight 279 lb (126.6 kg), SpO2 95 %.  Physical Exam Physical Exam Exam conducted with a chaperone present.  Constitutional:      Appearance: She is well-developed.  Eyes:     General: No scleral icterus.    Conjunctiva/sclera: Conjunctivae normal.  Neck:     Musculoskeletal: Neck supple.  Cardiovascular:     Rate and Rhythm: Normal rate and regular rhythm.     Heart sounds: Murmur present.  Systolic murmur present with a grade of 3/6.  Pulmonary:     Effort: Pulmonary effort is normal.     Breath sounds: Normal breath sounds.  Chest:     Breasts: Breasts are asymmetrical (right larger than left).        Right: No inverted nipple, mass, nipple discharge, skin change or tenderness.        Left: No inverted nipple, mass, nipple discharge, skin change or tenderness.  Lymphadenopathy:     Cervical: No cervical adenopathy.     Upper Body:     Right upper body: No axillary adenopathy.     Left upper body: No axillary adenopathy.  Skin:    General: Skin is warm and dry.  Neurological:     Mental Status: She is alert and oriented to person, place, and time.  Psychiatric:         Mood and Affect: Mood normal.     Data Reviewed ***  Assessment ***  Plan  Follow up as needed  HPI, Physical Exam, Assessment and Plan have been scribed under the direction and in the presence of Robert Bellow, MD  Concepcion Living, LPN  Concepcion Living M 01/02/2019, 2:53 PM

## 2019-01-03 ENCOUNTER — Ambulatory Visit
Admission: RE | Admit: 2019-01-03 | Discharge: 2019-01-03 | Disposition: A | Discharge status: Home / Self Care | Payer: Managed Care, Other (non HMO) | Source: Ambulatory Visit | Attending: Advanced Practice Midwife | Admitting: Advanced Practice Midwife

## 2019-01-03 DIAGNOSIS — N6315 Unspecified lump in the right breast, overlapping quadrants: Secondary | ICD-10-CM

## 2019-01-03 DIAGNOSIS — N644 Mastodynia: Secondary | ICD-10-CM

## 2019-01-03 DIAGNOSIS — N6310 Unspecified lump in the right breast, unspecified quadrant: Secondary | ICD-10-CM | POA: Diagnosis present

## 2019-01-06 ENCOUNTER — Encounter: Payer: Self-pay | Admitting: Family Medicine

## 2019-01-06 ENCOUNTER — Other Ambulatory Visit: Payer: Self-pay

## 2019-01-06 ENCOUNTER — Other Ambulatory Visit: Payer: Self-pay | Admitting: Advanced Practice Midwife

## 2019-01-06 ENCOUNTER — Ambulatory Visit (INDEPENDENT_AMBULATORY_CARE_PROVIDER_SITE_OTHER): Payer: Managed Care, Other (non HMO) | Admitting: Family Medicine

## 2019-01-06 VITALS — BP 128/74 | HR 72 | Temp 98.9°F | Resp 16 | Ht 66.0 in | Wt 289.0 lb

## 2019-01-06 DIAGNOSIS — G4733 Obstructive sleep apnea (adult) (pediatric): Secondary | ICD-10-CM

## 2019-01-06 DIAGNOSIS — N6320 Unspecified lump in the left breast, unspecified quadrant: Secondary | ICD-10-CM

## 2019-01-06 DIAGNOSIS — F419 Anxiety disorder, unspecified: Secondary | ICD-10-CM

## 2019-01-06 DIAGNOSIS — Z0000 Encounter for general adult medical examination without abnormal findings: Secondary | ICD-10-CM | POA: Diagnosis not present

## 2019-01-06 DIAGNOSIS — R928 Other abnormal and inconclusive findings on diagnostic imaging of breast: Secondary | ICD-10-CM

## 2019-01-06 DIAGNOSIS — E669 Obesity, unspecified: Secondary | ICD-10-CM | POA: Diagnosis not present

## 2019-01-06 NOTE — Progress Notes (Signed)
Patient: Natasha Boyd, Female    DOB: 08/22/1970, 48 y.o.   MRN: [id number] Visit Date: 01/06/2019  Today's Provider: Wilhemena Durie, MD   Chief Complaint  Patient presents with   Annual Exam   Subjective:     Annual physical exam Natasha Boyd is a 49 y.o. female who presents today for health maintenance and complete physical. She feels well. She reports exercising not regularly. She reports she is sleeping well.  Mammogram- 01/03/2019. BIRAD 3. Repeat in 6 months.  Tdap- 03/15/2015.  Pap- s/p hysterectomy.    Review of Systems  Constitutional: Negative.   HENT: Negative.   Eyes: Negative.   Respiratory: Negative.   Cardiovascular: Negative.   Gastrointestinal: Negative.   Endocrine: Negative.   Genitourinary: Negative.   Musculoskeletal: Positive for arthralgias and back pain.       She c/o occasional stiffness of wrist ankles and hands. No obvious swelling.  Skin: Negative.   Allergic/Immunologic: Negative.   Neurological: Negative.   Hematological: Negative.   Psychiatric/Behavioral: The patient is nervous/anxious.     Social History      She  reports that she has never smoked. She has never used smokeless tobacco. She reports current alcohol use. She reports that she does not use drugs.       Social History   Socioeconomic History   Marital status: Married    Spouse name: Not on file   Number of children: Not on file   Years of education: Not on file   Highest education level: Not on file  Occupational History   Not on file  Social Needs   Financial resource strain: Not on file   Food insecurity    Worry: Not on file    Inability: Not on file   Transportation needs    Medical: Not on file    Non-medical: Not on file  Tobacco Use   Smoking status: Never Smoker   Smokeless tobacco: Never Used  Substance and Sexual Activity   Alcohol use: Yes    Comment: Occasionally   Drug use: No   Sexual activity: Yes    Birth  control/protection: None  Lifestyle   Physical activity    Days per week: Not on file    Minutes per session: Not on file   Stress: Not on file  Relationships   Social connections    Talks on phone: Not on file    Gets together: Not on file    Attends religious service: Not on file    Active member of club or organization: Not on file    Attends meetings of clubs or organizations: Not on file    Relationship status: Not on file  Other Topics Concern   Not on file  Social History Narrative   Not on file    Past Medical History:  Diagnosis Date   Abdominal pain    Allergic rhinitis    Anginal pain ([place])    Anxiety    Aortic valve stenosis    Benign neoplasm of skin    Dyspnea    GERD (gastroesophageal reflux disease)    Heart murmur    Obesity    Polydipsia      Patient Active Problem List   Diagnosis Date Noted   Chest pain 11/17/2015   Pneumonia 11/03/2015   Murmur, cardiac 11/03/2015   Adult-onset obesity 03/16/2015   Allergic rhinitis 03/16/2015   Anxiety 03/16/2015   Abnormal liver enzymes 03/16/2015  Acid reflux 03/16/2015   Blood glucose elevated 03/16/2015   Hypertriglyceridemia 03/16/2015   LBP (low back pain) 03/16/2015   Apnea, sleep 03/16/2015   Screening-pulmonary TB 03/16/2015   Immunity status testing 03/16/2015   Flu vaccine need 03/16/2015   ADD (attention deficit disorder) 12/15/2014   Benign neoplasm of skin 02/13/2014    Past Surgical History:  Procedure Laterality Date   ABDOMINAL HYSTERECTOMY  2011   APPENDECTOMY  1996?   CESAREAN SECTION  2007   ESOPHAGOGASTRODUODENOSCOPY (EGD) WITH PROPOFOL N/A 03/03/2016   Procedure: ESOPHAGOGASTRODUODENOSCOPY (EGD) WITH PROPOFOL;  Surgeon: Manya Silvas, MD;  Location: [facility] ENDOSCOPY;  Service: Endoscopy;  Laterality: N/A;   TONSILLECTOMY  2010    Family History        No family status information on file.        Her She was adopted. Family history  is unknown by patient.      Allergies  Allergen Reactions   Iodine Rash    Anaphylaxis if injected.    Ivp Dye [Iodinated Diagnostic Agents] Anaphylaxis   Mushroom Extract Complex Anaphylaxis     Current Outpatient Medications:    amLODipine (NORVASC) 5 MG tablet, Take 1 tablet by mouth daily., Disp: , Rfl:    escitalopram (LEXAPRO) 20 MG tablet, Take 1 tablet (20 mg total) by mouth daily., Disp: 30 tablet, Rfl: 5   fluticasone (FLONASE) 50 MCG/ACT nasal spray, Place into the nose. Reported on 11/23/2015, Disp: , Rfl:    lansoprazole (PREVACID) 30 MG capsule, Take 1 capsule (30 mg total) by mouth daily at 12 noon., Disp: 30 capsule, Rfl: 5   metoprolol tartrate (LOPRESSOR) 25 MG tablet, Take 0.5 tablets (12.5 mg total) by mouth 2 (two) times daily., Disp: 30 tablet, Rfl: 5   OVER THE COUNTER MEDICATION, Apply 1 application topically daily as needed (Variety of Essential oils)., Disp: , Rfl:    Patient Care Team: Jerrol Banana., MD as PCP - General (Family Medicine) Christene Lye, MD (General Surgery) Ammie Dalton, Okey Regal, MD (Unknown Physician Specialty)    Objective:    Vitals: BP 128/74    Pulse 72    Temp 98.9 F (37.2 C)    Resp 16    Ht 5\' 6"  (1.676 m)    Wt 289 lb (131.1 kg)    SpO2 96%    BMI 46.65 kg/m    Vitals:   01/06/19 1419  BP: 128/74  Pulse: 72  Resp: 16  Temp: 98.9 F (37.2 C)  SpO2: 96%  Weight: 289 lb (131.1 kg)  Height: 5\' 6"  (1.676 m)     Physical Exam Vitals signs reviewed.  Constitutional:      Appearance: She is well-developed. She is obese.  HENT:     Head: Normocephalic and atraumatic.     Right Ear: External ear normal.     Left Ear: External ear normal.     Nose: Nose normal.  Eyes:     General: No scleral icterus.    Conjunctiva/sclera: Conjunctivae normal.  Neck:     Thyroid: No thyromegaly.  Cardiovascular:     Rate and Rhythm: Normal rate and regular rhythm.     Heart sounds: Murmur present.      Comments: Aortic murmur. Pulmonary:     Effort: Pulmonary effort is normal.     Breath sounds: Normal breath sounds.  Abdominal:     Palpations: Abdomen is soft.  Lymphadenopathy:     Cervical: No cervical adenopathy.  Skin:    General: Skin is warm and dry.  Neurological:     General: No focal deficit present.     Mental Status: She is alert and oriented to person, place, and time.  Psychiatric:        Mood and Affect: Mood normal.        Behavior: Behavior normal.        Thought Content: Thought content normal.        Judgment: Judgment normal.      Depression Screen PHQ 2/9 Scores 01/06/2019 12/06/2017 08/15/2016  PHQ - 2 Score 1 1 2   PHQ- 9 Score 2 10 8        Assessment & Plan:     Routine Health Maintenance and Physical Exam  Exercise Activities and Dietary recommendations Goals   None     Immunization History  Administered Date(s) Administered   Influenza Nasal 04/28/2010   Influenza,inj,Quad PF,6+ Mos 03/16/2015   Influenza-Unspecified 03/21/2018   Tdap 03/16/2015    Health Maintenance  Topic Date Due   HIV Screening  03/02/1985   PAP SMEAR-Modifier  03/03/1991   INFLUENZA VACCINE  01/18/2019   TETANUS/TDAP  03/15/2025     Discussed health benefits of physical activity, and encouraged her to engage in regular exercise appropriate for her age and condition. Colonoscopy age 48. 1. Annual physical exam Gyn per [facility].She had Hysterectomy for endometriosis. Never had abnormal pap per pt. - CBC with Differential/Platelet - Comprehensive metabolic panel - Lipid panel - TSH  2. Obstructive sleep apnea syndrome Wears CPAP nightly  3. Adult-onset obesity Pt says she has never been told she has PCOS--it is possible. She will try weight watchers presently to get some weight off. RTC 6 months to weigh. 4. Anxiety On Lexapro --controlled.    Fareeha Evon Cranford Mon, MD  [facility]

## 2019-01-06 NOTE — Patient Instructions (Signed)
Try weight watchers for weight loss.

## 2019-03-20 ENCOUNTER — Other Ambulatory Visit: Payer: Self-pay | Admitting: Family Medicine

## 2019-03-20 DIAGNOSIS — F419 Anxiety disorder, unspecified: Secondary | ICD-10-CM

## 2019-04-11 ENCOUNTER — Other Ambulatory Visit
Admission: RE | Admit: 2019-04-11 | Discharge: 2019-04-11 | Disposition: A | Discharge status: Home / Self Care | Payer: Managed Care, Other (non HMO) | Source: Ambulatory Visit | Attending: Cardiology | Admitting: Cardiology

## 2019-04-11 DIAGNOSIS — Z20828 Contact with and (suspected) exposure to other viral communicable diseases: Secondary | ICD-10-CM | POA: Diagnosis not present

## 2019-04-11 DIAGNOSIS — Z01812 Encounter for preprocedural laboratory examination: Secondary | ICD-10-CM | POA: Diagnosis not present

## 2019-04-11 LAB — SARS CORONAVIRUS 2 (TAT 6-24 HRS): SARS Coronavirus 2: NEGATIVE

## 2019-04-15 ENCOUNTER — Other Ambulatory Visit: Payer: Self-pay | Admitting: Cardiology

## 2019-04-15 MED ORDER — SODIUM CHLORIDE 0.9% FLUSH: 3.0000 mL | Freq: Two times a day (BID) | INTRAVENOUS | Status: AC

## 2019-04-16 ENCOUNTER — Encounter
Admission: RE | Disposition: A | Discharge status: Home / Self Care | Payer: Self-pay | Source: Home / Self Care | Attending: Cardiology

## 2019-04-16 ENCOUNTER — Other Ambulatory Visit: Payer: Self-pay | Admitting: Vascular Surgery

## 2019-04-16 ENCOUNTER — Ambulatory Visit
Admission: RE | Admit: 2019-04-16 | Discharge: 2019-04-16 | Disposition: A | Discharge status: Home / Self Care | Payer: Managed Care, Other (non HMO) | Attending: Cardiology | Admitting: Cardiology

## 2019-04-16 ENCOUNTER — Other Ambulatory Visit: Payer: Self-pay

## 2019-04-16 DIAGNOSIS — R7309 Other abnormal glucose: Secondary | ICD-10-CM | POA: Insufficient documentation

## 2019-04-16 DIAGNOSIS — K219 Gastro-esophageal reflux disease without esophagitis: Secondary | ICD-10-CM | POA: Diagnosis not present

## 2019-04-16 DIAGNOSIS — I080 Rheumatic disorders of both mitral and aortic valves: Secondary | ICD-10-CM | POA: Diagnosis not present

## 2019-04-16 DIAGNOSIS — Z888 Allergy status to other drugs, medicaments and biological substances status: Secondary | ICD-10-CM | POA: Insufficient documentation

## 2019-04-16 DIAGNOSIS — F419 Anxiety disorder, unspecified: Secondary | ICD-10-CM | POA: Diagnosis not present

## 2019-04-16 DIAGNOSIS — Z79899 Other long term (current) drug therapy: Secondary | ICD-10-CM | POA: Diagnosis not present

## 2019-04-16 DIAGNOSIS — R0602 Shortness of breath: Secondary | ICD-10-CM | POA: Diagnosis not present

## 2019-04-16 DIAGNOSIS — I38 Endocarditis, valve unspecified: Secondary | ICD-10-CM

## 2019-04-16 HISTORY — PX: RIGHT/LEFT HEART CATH AND CORONARY ANGIOGRAPHY: CATH118266

## 2019-04-16 HISTORY — DX: Sleep apnea, unspecified: G47.30

## 2019-04-16 SURGERY — RIGHT/LEFT HEART CATH AND CORONARY ANGIOGRAPHY
Anesthesia: Moderate Sedation

## 2019-04-16 MED ORDER — SODIUM CHLORIDE 0.9% FLUSH: 3.0000 mL | INTRAVENOUS | Status: DC | PRN

## 2019-04-16 MED ORDER — MIDAZOLAM HCL 2 MG/2ML IJ SOLN
INTRAMUSCULAR | Status: DC | PRN
  Administered 2019-04-16 (×2): 1 mg via INTRAVENOUS

## 2019-04-16 MED ORDER — ASPIRIN 81 MG PO CHEW
CHEWABLE_TABLET | ORAL | Status: AC
  Filled 2019-04-16: qty 1

## 2019-04-16 MED ORDER — SODIUM CHLORIDE 0.9% FLUSH: 3.0000 mL | Freq: Two times a day (BID) | INTRAVENOUS | Status: DC

## 2019-04-16 MED ORDER — HYDRALAZINE HCL 20 MG/ML IJ SOLN: 10.0000 mg | INTRAMUSCULAR | Status: DC | PRN

## 2019-04-16 MED ORDER — FENTANYL CITRATE (PF) 100 MCG/2ML IJ SOLN
INTRAMUSCULAR | Status: AC
  Filled 2019-04-16: qty 2

## 2019-04-16 MED ORDER — ACETAMINOPHEN 325 MG PO TABS: 650.0000 mg | ORAL_TABLET | ORAL | Status: DC | PRN

## 2019-04-16 MED ORDER — ASPIRIN 81 MG PO CHEW
81.0000 mg | CHEWABLE_TABLET | ORAL | Status: AC
  Administered 2019-04-16: 81 mg via ORAL

## 2019-04-16 MED ORDER — SODIUM CHLORIDE 0.9 % IV SOLN: 250.0000 mL | INTRAVENOUS | Status: DC | PRN

## 2019-04-16 MED ORDER — MIDAZOLAM HCL 2 MG/2ML IJ SOLN
INTRAMUSCULAR | Status: AC
  Filled 2019-04-16: qty 2

## 2019-04-16 MED ORDER — HEPARIN (PORCINE) IN NACL 1000-0.9 UT/500ML-% IV SOLN
INTRAVENOUS | Status: DC | PRN
  Administered 2019-04-16 (×2): 500 mL

## 2019-04-16 MED ORDER — SODIUM CHLORIDE FLUSH 0.9 % IV SOLN
INTRAVENOUS | Status: AC
  Filled 2019-04-16: qty 10

## 2019-04-16 MED ORDER — DIPHENHYDRAMINE HCL 50 MG/ML IJ SOLN
50.0000 mg | Freq: Once | INTRAMUSCULAR | Status: AC
  Administered 2019-04-16: 08:00:00 50 mg via INTRAVENOUS

## 2019-04-16 MED ORDER — FENTANYL CITRATE (PF) 100 MCG/2ML IJ SOLN
INTRAMUSCULAR | Status: DC | PRN
  Administered 2019-04-16 (×2): 25 ug via INTRAVENOUS

## 2019-04-16 MED ORDER — METHYLPREDNISOLONE SODIUM SUCC 125 MG IJ SOLR
INTRAMUSCULAR | Status: AC
  Administered 2019-04-16: 125 mg via INTRAVENOUS
  Filled 2019-04-16: qty 2

## 2019-04-16 MED ORDER — HEPARIN (PORCINE) IN NACL 1000-0.9 UT/500ML-% IV SOLN
INTRAVENOUS | Status: AC
  Filled 2019-04-16: qty 1000

## 2019-04-16 MED ORDER — SODIUM CHLORIDE 0.9 % WEIGHT BASED INFUSION: 3.0000 mL/kg/h | INTRAVENOUS | Status: AC

## 2019-04-16 MED ORDER — IOHEXOL 300 MG/ML  SOLN
INTRAMUSCULAR | Status: DC | PRN
  Administered 2019-04-16: 10:00:00 150 mL

## 2019-04-16 MED ORDER — DIPHENHYDRAMINE HCL 50 MG/ML IJ SOLN
INTRAMUSCULAR | Status: AC
  Administered 2019-04-16: 50 mg via INTRAVENOUS
  Filled 2019-04-16: qty 1

## 2019-04-16 MED ORDER — METHYLPREDNISOLONE SODIUM SUCC 125 MG IJ SOLR
125.0000 mg | Freq: Once | INTRAMUSCULAR | Status: AC
  Administered 2019-04-16: 08:00:00 125 mg via INTRAVENOUS

## 2019-04-16 MED ORDER — SODIUM CHLORIDE 0.9 % WEIGHT BASED INFUSION
1.0000 mL/kg/h | INTRAVENOUS | Status: DC
  Administered 2019-04-16: 1 mL/kg/h via INTRAVENOUS

## 2019-04-16 MED ORDER — LABETALOL HCL 5 MG/ML IV SOLN: 10.0000 mg | INTRAVENOUS | Status: DC | PRN

## 2019-04-16 MED ORDER — ONDANSETRON HCL 4 MG/2ML IJ SOLN: 4.0000 mg | Freq: Four times a day (QID) | INTRAMUSCULAR | Status: DC | PRN

## 2019-04-16 MED ORDER — SODIUM CHLORIDE 0.9 % IV SOLN: INTRAVENOUS | Status: DC

## 2019-04-16 SURGICAL SUPPLY — 17 items
CATH AMP RT 5F (CATHETERS) ×2 IMPLANT
CATH INFINITI 5 FR 3DRC (CATHETERS) ×2 IMPLANT
CATH INFINITI 5FR AL1 (CATHETERS) ×2 IMPLANT
CATH INFINITI 5FR ANG PIGTAIL (CATHETERS) ×2 IMPLANT
CATH INFINITI 5FR JL4 (CATHETERS) ×2 IMPLANT
CATH INFINITI JR4 5F (CATHETERS) ×2 IMPLANT
CATH SWANZ 7F THERMO (CATHETERS) ×2 IMPLANT
DEVICE CLOSURE MYNXGRIP 6/7F (Vascular Products) ×4 IMPLANT
GUIDEWIRE EMER 3M J .025X150CM (WIRE) ×2 IMPLANT
KIT MANI 3VAL PERCEP (MISCELLANEOUS) ×2 IMPLANT
KIT RIGHT HEART (MISCELLANEOUS) ×2 IMPLANT
NEEDLE PERC 18GX7CM (NEEDLE) ×2 IMPLANT
PACK CARDIAC CATH (CUSTOM PROCEDURE TRAY) ×2 IMPLANT
SHEATH AVANTI 6FR X 11CM (SHEATH) ×2 IMPLANT
SHEATH AVANTI 7FRX11 (SHEATH) ×2 IMPLANT
WIRE EMERALD ST .035X150CM (WIRE) ×2 IMPLANT
WIRE GUIDERIGHT .035X150 (WIRE) ×4 IMPLANT

## 2019-04-16 NOTE — Progress Notes (Signed)
Patient slightly anxious about dried blood present between legs at end of recovery period. Assisted patient in cleaning dried blood, dressing clean dry intact, site WNL. No evidence of bleeding or oozing at time of discharge.

## 2019-04-16 NOTE — H&P (Signed)
Chief Complaint: Chief Complaint  Patient presents with  . Follow-up  echo  Date of Service: 04/02/2019 Date of Birth: [birth year] PCP: Dwaine Gale, MD  History of Present Illness: Natasha Boyd is a 48 y.o.female patient who presents for a follow-up visit. Has a history of mitral valve prolapse and recent diagnosis of moderate aortic stenosis. Cardiac MRI revealed a bicuspid valve with valve area estimated at 1.3 cm. Peak flow velocity of 3.7 m/s. Echo done yesterday revealed preserved LV function with a peak flow of 4.21 m/s. The peak gradient is 70.9 mmHg and a mean gradient of 40.9 mmHg. There was trivial AI. Trivial MR. Valve area was estimated at 0.79 cm. She has been noting increasing shortness of breath and peripheral edema. She is also gained some weight recently. She has some fatigue when ambulating distances that she thinks should be easily carried out. She denies orthopnea or PND. She is unaware of her family history due to being adopted. She denies syncope or presyncope. She states her blood pressure is been somewhat labile and she is noting peripheral edema. Past Medical and Surgical History  Past Medical History Past Medical History:  Diagnosis Date  . Anxiety  . GERD (gastroesophageal reflux disease)   Past Surgical History She has a past surgical history that includes Appendectomy; Hysterectomy Total Abdominal W/Removal Tubes &/Or Ovaries; Tonsillectomy; Cesarean section; and egd (03/03/2016).   Medications and Allergies  Current Medications  Current Outpatient Medications  Medication Sig Dispense Refill  . ALPRAZolam (XANAX) 0.25 MG tablet Take 0.25 mg by mouth nightly as needed.   Marland Kitchen amLODIPine (NORVASC) 5 MG tablet Take 0.5 tablets (2.5 mg total) by mouth once daily (Patient taking differently: Take 5 mg by mouth once daily ) 30 tablet 11  . escitalopram oxalate (LEXAPRO) 20 MG tablet Take 1 tablet by mouth once daily.  . fluticasone (FLONASE) 50  mcg/actuation nasal spray Place 2 sprays into both nostrils as needed.  . hydroCHLOROthiazide (HYDRODIURIL) 25 MG tablet Take 1 tablet (25 mg total) by mouth once daily 30 tablet 11  . lansoprazole (PREVACID) 30 MG DR capsule Take 1 capsule by mouth once daily.  . metoprolol tartrate (LOPRESSOR) 25 MG tablet  . ranitidine (ZANTAC) 150 MG capsule Take by mouth.   No current facility-administered medications for this visit.   Allergies: Iodine, Etodolac, and Mushroom  Social and Family History  Social History reports that she has never smoked. She has never used smokeless tobacco. She reports current alcohol use of about 4.0 standard drinks of alcohol per week. She reports that she does not use drugs.  Family History Family History  Adopted: Yes   Review of Systems  Review of Systems  Constitutional: Negative for chills, diaphoresis, fever, malaise/fatigue and weight loss.  HENT: Negative for congestion, ear discharge, hearing loss and tinnitus.  Eyes: Negative for blurred vision.  Respiratory: Positive for shortness of breath. Negative for cough, hemoptysis, sputum production and wheezing.  Cardiovascular: Positive for chest pain. Negative for palpitations, orthopnea, claudication, leg swelling and PND.  Gastrointestinal: Negative for abdominal pain, blood in stool, constipation, diarrhea, heartburn, melena, nausea and vomiting.  Genitourinary: Negative for dysuria, frequency, hematuria and urgency.  Musculoskeletal: Negative for back pain, falls, joint pain and myalgias.  Skin: Negative for itching and rash.  Neurological: Positive for dizziness and headaches. Negative for tingling, focal weakness, loss of consciousness and weakness.  Endo/Heme/Allergies: Negative for polydipsia. Does not bruise/bleed easily.  Psychiatric/Behavioral: Negative for depression, memory loss and substance abuse.  The patient is nervous/anxious.   Physical Examination   Vitals:BP 130/84  Pulse 76  Resp  16  Ht 167.6 cm (5\' 6" )  Wt (!) 128.8 kg (284 lb)  SpO2 98%  BMI 45.84 kg/m  Ht:167.6 cm (5\' 6" ) Wt:(!) 128.8 kg (284 lb) [id number] surface area is 2.45 meters squared. Body mass index is 45.84 kg/m.  Wt Readings from Last 3 Encounters:  04/02/19 (!) 128.8 kg (284 lb)  03/17/19 (!) 129.3 kg (285 lb)  09/16/18 (!) 120.7 kg (266 lb)   BP Readings from Last 3 Encounters:  04/02/19 130/84  03/17/19 130/80  09/16/18 130/70   General appearance appears in no acute distress  Head Mouth and Eye exam Normocephalic, without obvious abnormality, atraumatic Dentition is good Eyes appear anicteric   LUNGS Breath Sounds: Normal Percussion: Normal  CARDIOVASCULAR JVP CV wave: no HJR: no Elevation at 90 degrees: None Carotid Pulse: normal pulsation bilaterally Bruit: None Apex: apical impulse normal  Auscultation Rhythm: normal sinus rhythm S1: normal S2: normal Clicks: no Rub: no Murmurs: 2/6 medium pitched mid systolic blowing at lower left sternal border  Gallop: None  EXTREMITIES Clubbing: no Edema: trace to 1+ bilateral pedal edema Pulses: peripheral pulses symmetrical Femoral Bruits: no Amputation: no SKIN Rash: no Cyanosis: no Embolic phemonenon: no Bruising: no NEURO Alert and Oriented to person, place and time: yes Non focal: yes  PSYCH: Pt appears to have normal affect  LABS REVIEWED Last 3 CBC results: Lab Results  Component Value Date  WBC 6.3 01/20/2016   Lab Results  Component Value Date  HGB 13.7 01/20/2016   Lab Results  Component Value Date  HCT 40.8 01/20/2016   Lab Results  Component Value Date  PLT 180 01/20/2016   Lab Results  Component Value Date  CREATININE 0.9 01/20/2016  BUN 17 01/20/2016  NA 139 01/20/2016  K 4.3 01/20/2016  CL 105 01/20/2016  CO2 29.4 01/20/2016   No results found for: HGBA1C  No results found for: HDL No results found for: LDLCALC No results found for: TRIG  Lab Results  Component Value  Date  ALT 41 (H) 01/20/2016  AST 31 01/20/2016  ALKPHOS 61 01/20/2016   Lab Results  Component Value Date  TSH 1.998 01/20/2016   Diagnostic Studies Reviewed:  EKG EKG demonstrated normal sinus rhythm, nonspecific ST and T waves changes.  Assessment and Plan   48 y.o. female with  ICD-10-CM ICD-9-CM  1. Atypical chest pain-patient with chest pain or murmur. Transthoracic echo showed mild to moderate stenosis with no ischemia. Cardiac MRI with stress did not show any evidence epicardial disease. Suggested possible small vessel disease. Also confirmed aortic stenosis suggesting moderate aortic stenosis.Marland Kitchen Pete echo done yesterday revealed peak gradient of greater than 4 m/s with a valve area 0.79. Discussed with cardiothoracic surgery. R07.89 786.59    2. Shortness of breath and as per above. Mild to moderate aortic stenosis. Preserved LV function. Even increased symptoms will R06.02 786.05    3. Other abnormal glucose R73.09 790.29  4. Chest pain, unspecified type-as per above R07.9 786.50   5. Peripheral edema-continue with current medications and low-sodium diet. Weight loss was recommended.  Return in about 3 months (around 07/03/2019).  These notes generated with voice recognition software. I apologize for typographical errors.  Sydnee Levans, MD   Pt seen and examined. No change from above.

## 2019-05-30 ENCOUNTER — Telehealth: Payer: Self-pay | Admitting: Family Medicine

## 2019-05-30 NOTE — Telephone Encounter (Signed)
Patient is calling to report to Dr. Rosanna Randy that she is going to cancel her appt for next week. As she is have aortic value replaced.

## 2019-06-03 ENCOUNTER — Ambulatory Visit: Payer: Self-pay | Admitting: Family Medicine

## 2019-07-16 ENCOUNTER — Encounter: Payer: 59 | Attending: Cardiology | Admitting: *Deleted

## 2019-07-16 ENCOUNTER — Other Ambulatory Visit: Payer: Self-pay

## 2019-07-16 DIAGNOSIS — F419 Anxiety disorder, unspecified: Secondary | ICD-10-CM | POA: Insufficient documentation

## 2019-07-16 DIAGNOSIS — K219 Gastro-esophageal reflux disease without esophagitis: Secondary | ICD-10-CM | POA: Insufficient documentation

## 2019-07-16 DIAGNOSIS — Z952 Presence of prosthetic heart valve: Secondary | ICD-10-CM | POA: Insufficient documentation

## 2019-07-16 DIAGNOSIS — Z7901 Long term (current) use of anticoagulants: Secondary | ICD-10-CM | POA: Insufficient documentation

## 2019-07-16 DIAGNOSIS — E669 Obesity, unspecified: Secondary | ICD-10-CM | POA: Insufficient documentation

## 2019-07-16 DIAGNOSIS — Z6841 Body Mass Index (BMI) 40.0 and over, adult: Secondary | ICD-10-CM | POA: Insufficient documentation

## 2019-07-16 DIAGNOSIS — Z79899 Other long term (current) drug therapy: Secondary | ICD-10-CM | POA: Insufficient documentation

## 2019-07-16 DIAGNOSIS — G4730 Sleep apnea, unspecified: Secondary | ICD-10-CM | POA: Insufficient documentation

## 2019-07-16 NOTE — Progress Notes (Signed)
Completed virtual orientation today.  EP eval scheduled for 1/29 at 1330.

## 2019-07-17 ENCOUNTER — Encounter: Payer: 59 | Admitting: *Deleted

## 2019-07-17 ENCOUNTER — Other Ambulatory Visit: Payer: Self-pay

## 2019-07-17 VITALS — Ht 65.5 in | Wt 280.6 lb

## 2019-07-17 DIAGNOSIS — Z952 Presence of prosthetic heart valve: Secondary | ICD-10-CM | POA: Diagnosis not present

## 2019-07-17 DIAGNOSIS — K219 Gastro-esophageal reflux disease without esophagitis: Secondary | ICD-10-CM | POA: Diagnosis not present

## 2019-07-17 DIAGNOSIS — Z79899 Other long term (current) drug therapy: Secondary | ICD-10-CM | POA: Diagnosis not present

## 2019-07-17 DIAGNOSIS — E669 Obesity, unspecified: Secondary | ICD-10-CM | POA: Diagnosis not present

## 2019-07-17 DIAGNOSIS — F419 Anxiety disorder, unspecified: Secondary | ICD-10-CM | POA: Diagnosis not present

## 2019-07-17 DIAGNOSIS — G4730 Sleep apnea, unspecified: Secondary | ICD-10-CM | POA: Diagnosis not present

## 2019-07-17 DIAGNOSIS — Z7901 Long term (current) use of anticoagulants: Secondary | ICD-10-CM | POA: Diagnosis not present

## 2019-07-17 DIAGNOSIS — Z6841 Body Mass Index (BMI) 40.0 and over, adult: Secondary | ICD-10-CM | POA: Diagnosis not present

## 2019-07-17 NOTE — Progress Notes (Signed)
Cardiac Individual Treatment Plan  Patient Details  Name: Natasha Boyd MRN: [id number] Date of Birth: 1970-11-28 Referring Provider:     Cardiac Rehab from 07/17/2019 in [facility] Cardiac and Pulmonary Rehab  Referring Provider  Bartholome Bill MD      Initial Encounter Date:    Cardiac Rehab from 07/17/2019 in [facility] Cardiac and Pulmonary Rehab  Date  07/17/19      Visit Diagnosis: S/P aortic valve replacement  Patient's Home Medications on Admission:  Current Outpatient Medications:  .  amLODipine (NORVASC) 5 MG tablet, Take 5 mg by mouth at bedtime. , Disp: , Rfl:  .  escitalopram (LEXAPRO) 20 MG tablet, TAKE 1 TABLET(20 MG) BY MOUTH DAILY (Patient taking differently: Take 20 mg by mouth at bedtime. ), Disp: 30 tablet, Rfl: 5 .  fluticasone (FLONASE) 50 MCG/ACT nasal spray, Place 2 sprays into the nose as needed for allergies. , Disp: , Rfl:  .  furosemide (LASIX) 40 MG tablet, Take by mouth., Disp: , Rfl:  .  hydrochlorothiazide (HYDRODIURIL) 25 MG tablet, Take 25 mg by mouth daily., Disp: , Rfl:  .  lansoprazole (PREVACID) 30 MG capsule, Take 1 capsule (30 mg total) by mouth daily at 12 noon. (Patient taking differently: Take 30 mg by mouth as needed (Acid reflux). ), Disp: 30 capsule, Rfl: 5 .  metoprolol tartrate (LOPRESSOR) 25 MG tablet, Take 0.5 tablets (12.5 mg total) by mouth 2 (two) times daily., Disp: 30 tablet, Rfl: 5 .  metoprolol tartrate (LOPRESSOR) 25 MG tablet, Take by mouth., Disp: , Rfl:  .  Potassium Chloride 40 MEQ/15ML (20%) SOLN, Take by mouth., Disp: , Rfl:  .  warfarin (COUMADIN) 2 MG tablet, Take by mouth., Disp: , Rfl:  No current facility-administered medications for this visit.  Facility-Administered Medications Ordered in Other Visits:  .  sodium chloride flush (NS) 0.9 % injection 3 mL, 3 mL, Intravenous, Q12H, Fath, Javier Docker, MD  Past Medical History: Past Medical History:  Diagnosis Date  . Allergic rhinitis   . Anginal pain ([place])   . Anxiety    . Aortic valve stenosis   . Benign neoplasm of skin   . Dyspnea   . GERD (gastroesophageal reflux disease)   . Heart murmur   . Obesity   . Polydipsia   . Sleep apnea    uses CPAP at night    Tobacco Use: Social History   Tobacco Use  Smoking Status Never Smoker  Smokeless Tobacco Never Used    Labs: Recent Review Flowsheet Data    Labs for ITP Cardiac and Pulmonary Rehab Latest Ref Rng & Units 03/16/2014 08/28/2014 11/18/2015 12/12/2017   Cholestrol 100 - 199 mg/dL 201(A) - 191 223(H)   LDLCALC 0 - 99 mg/dL 125 - 122(H) 149(H)   HDL >39 mg/dL 34(A) - 35(L) 43   Trlycerides 0 - 149 mg/dL 209(A) - 171(H) 155(H)   Hemoglobin A1c 4.0 - 6.0 % - 5.7 - -       Exercise Target Goals: Exercise Program Goal: Individual exercise prescription set using results from initial 6 min walk test and THRR while considering  patient's activity barriers and safety.   Exercise Prescription Goal: Initial exercise prescription builds to 30-45 minutes a day of aerobic activity, 2-3 days per week.  Home exercise guidelines will be given to patient during program as part of exercise prescription that the participant will acknowledge.  Activity Barriers & Risk Stratification: Activity Barriers & Cardiac Risk Stratification - 07/17/19 1623  Activity Barriers & Cardiac Risk Stratification   Activity Barriers  Deconditioning;Muscular Weakness;Other (comment)    Comments  incision pulls but not painful    Cardiac Risk Stratification  Low       6 Minute Walk: 6 Minute Walk    Row Name 07/17/19 1622         6 Minute Walk   Phase  Initial     Distance  1195 feet     Walk Time  6 minutes     # of Rest Breaks  0     MPH  2.26     METS  3.29     RPE  13     Perceived Dyspnea   1     VO2 Peak  11.54     Symptoms  Yes (comment)     Comments  SOB     Resting HR  87 bpm     Resting BP  114/74     Resting Oxygen Saturation   96 %     Exercise Oxygen Saturation  during 6 min walk  96 %      Max Ex. HR  113 bpm     Max Ex. BP  174/76     2 Minute Post BP  136/66        Oxygen Initial Assessment:   Oxygen Re-Evaluation:   Oxygen Discharge (Final Oxygen Re-Evaluation):   Initial Exercise Prescription: Initial Exercise Prescription - 07/17/19 1600      Date of Initial Exercise RX and Referring Provider   Date  07/17/19    Referring Provider  Bartholome Bill MD      Treadmill   MPH  2.1    Grade  0.5    Minutes  15    METs  2.75      NuStep   Level  3    SPM  80    Minutes  15    METs  2.7      Elliptical   Level  1    Speed  3    Minutes  15      REL-XR   Level  2    Speed  50    Minutes  15    METs  2.7      T5 Nustep   Level  3    SPM  80    Minutes  15    METs  2.7      Prescription Details   Frequency (times per week)  1    Duration  Progress to 30 minutes of continuous aerobic without signs/symptoms of physical distress      Intensity   THRR 40-80% of Max Heartrate  121-154    Ratings of Perceived Exertion  11-13    Perceived Dyspnea  0-4      Progression   Progression  Continue to progress workloads to maintain intensity without signs/symptoms of physical distress.      Resistance Training   Training Prescription  Yes    Weight  3 lb    Reps  10-15       Perform Capillary Blood Glucose checks as needed.  Exercise Prescription Changes: Exercise Prescription Changes    Row Name 07/17/19 1600             Response to Exercise   Blood Pressure (Admit)  114/74       Blood Pressure (Exercise)  174/76       Blood Pressure (Exit)  136/66       Heart Rate (Admit)  87 bpm       Heart Rate (Exercise)  113 bpm       Heart Rate (Exit)  95 bpm       Oxygen Saturation (Admit)  96 %       Oxygen Saturation (Exercise)  96 %       Rating of Perceived Exertion (Exercise)  13       Perceived Dyspnea (Exercise)  1       Symptoms  SOB       Comments  walk test results          Exercise Comments:   Exercise Goals and  Review: Exercise Goals    Row Name 07/17/19 1625             Exercise Goals   Increase Physical Activity  Yes       Intervention  Provide advice, education, support and counseling about physical activity/exercise needs.;Develop an individualized exercise prescription for aerobic and resistive training based on initial evaluation findings, risk stratification, comorbidities and participant's personal goals.       Expected Outcomes  Short Term: Attend rehab on a regular basis to increase amount of physical activity.;Long Term: Add in home exercise to make exercise part of routine and to increase amount of physical activity.;Long Term: Exercising regularly at least 3-5 days a week.       Increase Strength and Stamina  Yes       Intervention  Provide advice, education, support and counseling about physical activity/exercise needs.;Develop an individualized exercise prescription for aerobic and resistive training based on initial evaluation findings, risk stratification, comorbidities and participant's personal goals.       Expected Outcomes  Short Term: Increase workloads from initial exercise prescription for resistance, speed, and METs.;Short Term: Perform resistance training exercises routinely during rehab and add in resistance training at home;Long Term: Improve cardiorespiratory fitness, muscular endurance and strength as measured by increased METs and functional capacity (6MWT)       Able to understand and use rate of perceived exertion (RPE) scale  Yes       Intervention  Provide education and explanation on how to use RPE scale       Expected Outcomes  Short Term: Able to use RPE daily in rehab to express subjective intensity level;Long Term:  Able to use RPE to guide intensity level when exercising independently       Able to understand and use Dyspnea scale  Yes       Intervention  Provide education and explanation on how to use Dyspnea scale       Expected Outcomes  Short Term: Able to  use Dyspnea scale daily in rehab to express subjective sense of shortness of breath during exertion;Long Term: Able to use Dyspnea scale to guide intensity level when exercising independently       Knowledge and understanding of Target Heart Rate Range (THRR)  Yes       Intervention  Provide education and explanation of THRR including how the numbers were predicted and where they are located for reference       Expected Outcomes  Short Term: Able to state/look up THRR;Short Term: Able to use daily as guideline for intensity in rehab;Long Term: Able to use THRR to govern intensity when exercising independently       Able to check pulse independently  Yes       Intervention  Provide  education and demonstration on how to check pulse in carotid and radial arteries.;Review the importance of being able to check your own pulse for safety during independent exercise       Expected Outcomes  Short Term: Able to explain why pulse checking is important during independent exercise;Long Term: Able to check pulse independently and accurately       Understanding of Exercise Prescription  Yes       Intervention  Provide education, explanation, and written materials on patient's individual exercise prescription       Expected Outcomes  Short Term: Able to explain program exercise prescription;Long Term: Able to explain home exercise prescription to exercise independently          Exercise Goals Re-Evaluation :   Discharge Exercise Prescription (Final Exercise Prescription Changes): Exercise Prescription Changes - 07/17/19 1600      Response to Exercise   Blood Pressure (Admit)  114/74    Blood Pressure (Exercise)  174/76    Blood Pressure (Exit)  136/66    Heart Rate (Admit)  87 bpm    Heart Rate (Exercise)  113 bpm    Heart Rate (Exit)  95 bpm    Oxygen Saturation (Admit)  96 %    Oxygen Saturation (Exercise)  96 %    Rating of Perceived Exertion (Exercise)  13    Perceived Dyspnea (Exercise)  1     Symptoms  SOB    Comments  walk test results       Nutrition:  Target Goals: Understanding of nutrition guidelines, daily intake of sodium '1500mg'$ , cholesterol '200mg'$ , calories 30% from fat and 7% or less from saturated fats, daily to have 5 or more servings of fruits and vegetables.  Biometrics: Pre Biometrics - 07/17/19 1625      Pre Biometrics   Height  5' 5.5" (1.664 m)    Weight  280 lb 9.6 oz (127.3 kg)    BMI (Calculated)  45.97    Single Leg Stand  30 seconds        Nutrition Therapy Plan and Nutrition Goals:   Nutrition Assessments:   Nutrition Goals Re-Evaluation:   Nutrition Goals Discharge (Final Nutrition Goals Re-Evaluation):   Psychosocial: Target Goals: Acknowledge presence or absence of significant depression and/or stress, maximize coping skills, provide positive support system. Participant is able to verbalize types and ability to use techniques and skills needed for reducing stress and depression.   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  Yes   husband, mom, best friends, kid (freshman in college and 8th grader)   Comments  family very loving      Barriers   Psychosocial barriers to participate in program  The patient should benefit from training in stress management and relaxation.;Psychosocial barriers identified (see note)      Screening Interventions   Interventions  Encouraged to exercise;Provide feedback about the scores to participant;To provide support and resources with identified psychosocial needs     Expected Outcomes  Short Term goal: Utilizing psychosocial counselor, staff and physician to assist with  identification of specific Stressors or current issues interfering with healing process. Setting desired goal for each stressor or current issue identified.;Long Term Goal: Stressors or current issues are controlled or eliminated.;Short Term goal: Identification and review with participant of any Quality of Life or Depression concerns found by scoring the questionnaire.;Long Term goal: The participant improves quality of Life and PHQ9 Scores as seen by post scores and/or verbalization of changes       Quality of Life Scores:   Scores of 19 and below usually indicate a poorer quality of life in these areas.  A difference of  2-3 points is a clinically meaningful difference.  A difference of 2-3 points in the total score of the Quality of Life Index has been associated with significant improvement in overall quality of life, self-image, physical symptoms, and general health in studies assessing change in quality of life.  PHQ-9: Recent Review Flowsheet Data    Depression screen [facility] 2/9 01/06/2019 12/06/2017 08/15/2016   Decreased Interest 0 1 1   Down, Depressed, Hopeless 1 0 1   PHQ - 2 Score '1 1 2   '$ Altered sleeping '1 3 1   '$ Tired, decreased energy 0 2 2   Change in appetite 0 3 0   Feeling bad or failure about yourself  0 0 2   Trouble concentrating 0 1 1   Moving slowly or fidgety/restless 0 0 0   Suicidal thoughts 0 0 0   PHQ-9 Score '2 10 8   '$ Difficult doing work/chores Somewhat difficult Somewhat difficult -     Interpretation of Total Score  Total Score Depression Severity:  1-4 = Minimal depression, 5-9 = Mild depression, 10-14 = Moderate depression, 15-19 = Moderately severe depression, 20-27 = Severe depression   Psychosocial Evaluation and Intervention: Psychosocial Evaluation - 07/16/19 1103      Psychosocial Evaluation & Interventions   Interventions  Stress management  education;Encouraged to exercise with the program and follow exercise prescription    Comments  Vickye is coming into cardiac rehab after a valve replacement.  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  She is eager to return to school part time soon and hopefully back in person in March. She misses her kids and seeing them in person.  She has a great support system and loving family that lifts her up daily.  She has a history of anxiety but is well managed on her lexapro currently.  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  She wants to lose weight and get into a good routine for exercise and diet again.  She also wants to rebuild her stamina to be able to make it through a day.    Expected Outcomes  Short: Attend rehab regularly to build stamina.  Long: Continue to stay positive and learn new lifestyle to maintain health.    Continue Psychosocial Services   Follow up required by staff       Psychosocial Re-Evaluation:   Psychosocial Discharge (Final Psychosocial Re-Evaluation):   Vocational Rehabilitation: Provide vocational rehab assistance to qualifying candidates.   Vocational Rehab Evaluation & Intervention: Vocational Rehab - 07/16/19 1048      Initial Vocational Rehab Evaluation & Intervention   Assessment shows need for Vocational Rehabilitation  No   hopes to return to to half days in two weeks      Education:  Education Goals: Education classes will be provided on a variety of topics geared toward better understanding of heart health and risk factor modification. Participant will state understanding/return demonstration of topics presented as noted by education test scores.  Learning Barriers/Preferences: Learning Barriers/Preferences - 07/16/19 1050      Learning Barriers/Preferences   Learning Barriers  Sight   glasses for distance   Learning  Preferences  None       Education Topics:  AED/CPR: - Group verbal and written instruction with the use of models to demonstrate the basic use of the AED with the basic ABC's of resuscitation.   General Nutrition Guidelines/Fats and Fiber: -Group instruction provided by verbal, written material, models and posters to present the general guidelines for heart healthy nutrition. Gives an explanation and review of dietary fats and fiber.   Controlling Sodium/Reading Food Labels: -Group verbal and written material supporting the discussion of sodium use in heart healthy nutrition. Review and explanation with models, verbal and written materials for utilization of the food label.   Exercise Physiology & General Exercise Guidelines: - Group verbal and written instruction with models to review the exercise physiology of the cardiovascular system and associated critical values. Provides general exercise guidelines with specific guidelines to those with heart or lung disease.    Aerobic Exercise & Resistance Training: - Gives group verbal and written instruction on the various components of exercise. Focuses on aerobic and resistive training programs and the benefits of this training and how to safely progress through these programs..   Flexibility, Balance, Mind/Body Relaxation: Provides group verbal/written instruction on the benefits of flexibility and balance training, including mind/body exercise modes such as yoga, pilates and tai chi.  Demonstration and skill practice provided.   Stress and Anxiety: - Provides group verbal and written instruction about the health risks of elevated stress and causes of high stress.  Discuss the correlation between heart/lung disease and anxiety and treatment options. Review healthy ways to manage with stress and anxiety.   Depression: - Provides group verbal and written instruction on the correlation between heart/lung disease and depressed mood,  treatment options, and the stigmas associated with seeking treatment.   Anatomy & Physiology of the Heart: - Group verbal and written instruction and models provide basic cardiac anatomy and physiology, with the coronary electrical and arterial systems. Review of Valvular disease and Heart Failure   Cardiac Procedures: - Group verbal and written instruction to review commonly prescribed medications for heart disease. Reviews the medication, class of the drug, and side effects. Includes the steps to properly store meds and maintain the prescription regimen. (beta blockers and nitrates)   Cardiac Medications I: - Group verbal and written instruction to review commonly prescribed medications for heart disease. Reviews the medication, class of the drug, and side effects. Includes the steps to properly store meds and maintain the prescription regimen.   Cardiac Medications II: -Group verbal and written instruction to review commonly prescribed medications for heart disease. Reviews the medication, class of the drug, and side effects. (all other drug classes)    Go Sex-Intimacy & Heart Disease, Get SMART - Goal Setting: - Group verbal and written instruction through game format to discuss heart disease and the return to sexual intimacy. Provides group verbal and written material to discuss and apply goal setting through the application of the S.M.A.R.T. Method.   Other Matters of the Heart: - Provides group verbal, written materials and models to describe Stable Angina and Peripheral Artery. Includes description of  the disease process and treatment options available to the cardiac patient.   Exercise & Equipment Safety: - Individual verbal instruction and demonstration of equipment use and safety with use of the equipment.   Cardiac Rehab from 07/17/2019 in [facility] Cardiac and Pulmonary Rehab  Date  07/17/19  Educator  [facility]  Instruction Review Code  1- Verbalizes Understanding      Infection  Prevention: - Provides verbal and written material to individual with discussion of infection control including proper hand washing and proper equipment cleaning during exercise session.   Cardiac Rehab from 07/17/2019 in [facility] Cardiac and Pulmonary Rehab  Date  07/17/19  Educator  [facility]  Instruction Review Code  1- Verbalizes Understanding      Falls Prevention: - Provides verbal and written material to individual with discussion of falls prevention and safety.   Cardiac Rehab from 07/17/2019 in [facility] Cardiac and Pulmonary Rehab  Date  07/17/19  Educator  [facility]  Instruction Review Code  1- Verbalizes Understanding      Diabetes: - Individual verbal and written instruction to review signs/symptoms of diabetes, desired ranges of glucose level fasting, after meals and with exercise. Acknowledge that pre and post exercise glucose checks will be done for 3 sessions at entry of program.   Know Your Numbers and Risk Factors: -Group verbal and written instruction about important numbers in your health.  Discussion of what are risk factors and how they play a role in the disease process.  Review of Cholesterol, Blood Pressure, Diabetes, and BMI and the role they play in your overall health.   Sleep Hygiene: -Provides group verbal and written instruction about how sleep can affect your health.  Define sleep hygiene, discuss sleep cycles and impact of sleep habits. Review good sleep hygiene tips.    Other: -Provides group and verbal instruction on various topics (see comments)   Knowledge Questionnaire Score:   Core Components/Risk Factors/Patient Goals at Admission: Personal Goals and Risk Factors at Admission - 07/16/19 1106      Core Components/Risk Factors/Patient Goals on Admission    Weight Management  Yes;Obesity;Weight Loss    Intervention  Weight Management: Develop a combined nutrition and exercise program designed to reach desired caloric intake, while maintaining appropriate intake  of nutrient and fiber, sodium and fats, and appropriate energy expenditure required for the weight goal.;Weight Management: Provide education and appropriate resources to help participant work on and attain dietary goals.;Weight Management/Obesity: Establish reasonable short term and long term weight goals.;Obesity: Provide education and appropriate resources to help participant work on and attain dietary goals.    Expected Outcomes  Short Term: Continue to assess and modify interventions until short term weight is achieved;Long Term: Adherence to nutrition and physical activity/exercise program aimed toward attainment of established weight goal;Weight Loss: Understanding of general recommendations for a balanced deficit meal plan, which promotes 1-2 lb weight loss per week and includes a negative energy balance of [phone] kcal/d;Understanding recommendations for meals to include 15-35% energy as protein, 25-35% energy from fat, 35-60% energy from carbohydrates, less than '200mg'$  of dietary cholesterol, 20-35 gm of total fiber daily;Understanding of distribution of calorie intake throughout the day with the consumption of 4-5 meals/snacks    Lipids  Yes    Intervention  Provide education and support for participant on nutrition & aerobic/resistive exercise along with prescribed medications to achieve LDL '70mg'$ , HDL >'40mg'$ .    Expected Outcomes  Short Term: Participant states understanding of desired cholesterol values and is compliant with medications prescribed.  Participant is following exercise prescription and nutrition guidelines.;Long Term: Cholesterol controlled with medications as prescribed, with individualized exercise RX and with personalized nutrition plan. Value goals: LDL < '70mg'$ , HDL > 40 mg.       Core Components/Risk Factors/Patient Goals Review:    Core Components/Risk Factors/Patient Goals at Discharge (Final Review):    ITP Comments: ITP Comments    Row Name 07/16/19 1110 07/17/19  1622         ITP Comments  Completed virtual orientation today.  EP eval scheduled for 1/29 at 1330. Documentation for diagnosis can be found in Care Everywhere encounter 06/04/19.  Completed 6MWT and gym orientation.  Initial ITP created and sent for review to Dr. Emily Filbert, Medical Director.         Comments: Initial ITP

## 2019-07-18 NOTE — Patient Instructions (Signed)
Patient Instructions  Patient Details  Name: Natasha Boyd MRN: [id number] Date of Birth: [birth year] Referring Provider:  Teodoro Spray, MD  Below are your personal goals for exercise, nutrition, and risk factors. Our goal is to help you stay on track towards obtaining and maintaining these goals. We will be discussing your progress on these goals with you throughout the program.  Initial Exercise Prescription: Initial Exercise Prescription - 07/17/19 1600      Date of Initial Exercise RX and Referring Provider   Date  07/17/19    Referring Provider  Bartholome Bill MD      Treadmill   MPH  2.1    Grade  0.5    Minutes  15    METs  2.75      NuStep   Level  3    SPM  80    Minutes  15    METs  2.7      Elliptical   Level  1    Speed  3    Minutes  15      REL-XR   Level  2    Speed  50    Minutes  15    METs  2.7      T5 Nustep   Level  3    SPM  80    Minutes  15    METs  2.7      Prescription Details   Frequency (times per week)  1    Duration  Progress to 30 minutes of continuous aerobic without signs/symptoms of physical distress      Intensity   THRR 40-80% of Max Heartrate  121-154    Ratings of Perceived Exertion  11-13    Perceived Dyspnea  0-4      Progression   Progression  Continue to progress workloads to maintain intensity without signs/symptoms of physical distress.      Resistance Training   Training Prescription  Yes    Weight  3 lb    Reps  10-15       Exercise Goals: Frequency: Be able to perform aerobic exercise two to three times per week in program working toward 2-5 days per week of home exercise.  Intensity: Work with a perceived exertion of 11 (fairly light) - 15 (hard) while following your exercise prescription.  We will make changes to your prescription with you as you progress through the program.   Duration: Be able to do 30 to 45 minutes of continuous aerobic exercise in addition to a 5 minute warm-up and a 5 minute  cool-down routine.   Nutrition Goals: Your personal nutrition goals will be established when you do your nutrition analysis with the dietician.  The following are general nutrition guidelines to follow: Cholesterol < 200mg /day Sodium < 1500mg /day Fiber: Women under 50 yrs - 25 grams per day  Personal Goals: Personal Goals and Risk Factors at Admission - 07/18/19 0748      Core Components/Risk Factors/Patient Goals on Admission    Weight Management  Yes;Obesity;Weight Loss    Intervention  Weight Management: Develop a combined nutrition and exercise program designed to reach desired caloric intake, while maintaining appropriate intake of nutrient and fiber, sodium and fats, and appropriate energy expenditure required for the weight goal.;Weight Management: Provide education and appropriate resources to help participant work on and attain dietary goals.;Weight Management/Obesity: Establish reasonable short term and long term weight goals.;Obesity: Provide education and appropriate resources to help participant work on  and attain dietary goals.    Admit Weight  280 lb 9.6 oz (127.3 kg)    Goal Weight: Short Term  275 lb (124.7 kg)    Goal Weight: Long Term  250 lb (113.4 kg)    Expected Outcomes  Short Term: Continue to assess and modify interventions until short term weight is achieved;Long Term: Adherence to nutrition and physical activity/exercise program aimed toward attainment of established weight goal;Weight Loss: Understanding of general recommendations for a balanced deficit meal plan, which promotes 1-2 lb weight loss per week and includes a negative energy balance of [phone] kcal/d;Understanding recommendations for meals to include 15-35% energy as protein, 25-35% energy from fat, 35-60% energy from carbohydrates, less than 200mg  of dietary cholesterol, 20-35 gm of total fiber daily;Understanding of distribution of calorie intake throughout the day with the consumption of 4-5 meals/snacks     Lipids  Yes    Intervention  Provide education and support for participant on nutrition & aerobic/resistive exercise along with prescribed medications to achieve LDL 70mg , HDL >40mg .    Expected Outcomes  Short Term: Participant states understanding of desired cholesterol values and is compliant with medications prescribed. Participant is following exercise prescription and nutrition guidelines.;Long Term: Cholesterol controlled with medications as prescribed, with individualized exercise RX and with personalized nutrition plan. Value goals: LDL < 70mg , HDL > 40 mg.       Tobacco Use Initial Evaluation: Social History   Tobacco Use  Smoking Status Never Smoker  Smokeless Tobacco Never Used    Exercise Goals and Review: Exercise Goals    Row Name 07/17/19 1625             Exercise Goals   Increase Physical Activity  Yes       Intervention  Provide advice, education, support and counseling about physical activity/exercise needs.;Develop an individualized exercise prescription for aerobic and resistive training based on initial evaluation findings, risk stratification, comorbidities and participant's personal goals.       Expected Outcomes  Short Term: Attend rehab on a regular basis to increase amount of physical activity.;Long Term: Add in home exercise to make exercise part of routine and to increase amount of physical activity.;Long Term: Exercising regularly at least 3-5 days a week.       Increase Strength and Stamina  Yes       Intervention  Provide advice, education, support and counseling about physical activity/exercise needs.;Develop an individualized exercise prescription for aerobic and resistive training based on initial evaluation findings, risk stratification, comorbidities and participant's personal goals.       Expected Outcomes  Short Term: Increase workloads from initial exercise prescription for resistance, speed, and METs.;Short Term: Perform resistance training  exercises routinely during rehab and add in resistance training at home;Long Term: Improve cardiorespiratory fitness, muscular endurance and strength as measured by increased METs and functional capacity (6MWT)       Able to understand and use rate of perceived exertion (RPE) scale  Yes       Intervention  Provide education and explanation on how to use RPE scale       Expected Outcomes  Short Term: Able to use RPE daily in rehab to express subjective intensity level;Long Term:  Able to use RPE to guide intensity level when exercising independently       Able to understand and use Dyspnea scale  Yes       Intervention  Provide education and explanation on how to use Dyspnea scale  Expected Outcomes  Short Term: Able to use Dyspnea scale daily in rehab to express subjective sense of shortness of breath during exertion;Long Term: Able to use Dyspnea scale to guide intensity level when exercising independently       Knowledge and understanding of Target Heart Rate Range (THRR)  Yes       Intervention  Provide education and explanation of THRR including how the numbers were predicted and where they are located for reference       Expected Outcomes  Short Term: Able to state/look up THRR;Short Term: Able to use daily as guideline for intensity in rehab;Long Term: Able to use THRR to govern intensity when exercising independently       Able to check pulse independently  Yes       Intervention  Provide education and demonstration on how to check pulse in carotid and radial arteries.;Review the importance of being able to check your own pulse for safety during independent exercise       Expected Outcomes  Short Term: Able to explain why pulse checking is important during independent exercise;Long Term: Able to check pulse independently and accurately       Understanding of Exercise Prescription  Yes       Intervention  Provide education, explanation, and written materials on patient's individual exercise  prescription       Expected Outcomes  Short Term: Able to explain program exercise prescription;Long Term: Able to explain home exercise prescription to exercise independently          Copy of goals given to participant.

## 2019-07-24 ENCOUNTER — Encounter: Payer: 59 | Attending: Cardiology | Admitting: *Deleted

## 2019-07-24 ENCOUNTER — Other Ambulatory Visit: Payer: Self-pay

## 2019-07-24 DIAGNOSIS — Z79899 Other long term (current) drug therapy: Secondary | ICD-10-CM | POA: Insufficient documentation

## 2019-07-24 DIAGNOSIS — Z6841 Body Mass Index (BMI) 40.0 and over, adult: Secondary | ICD-10-CM | POA: Diagnosis not present

## 2019-07-24 DIAGNOSIS — Z7901 Long term (current) use of anticoagulants: Secondary | ICD-10-CM | POA: Diagnosis not present

## 2019-07-24 DIAGNOSIS — Z952 Presence of prosthetic heart valve: Secondary | ICD-10-CM | POA: Insufficient documentation

## 2019-07-24 DIAGNOSIS — F419 Anxiety disorder, unspecified: Secondary | ICD-10-CM | POA: Diagnosis not present

## 2019-07-24 DIAGNOSIS — E669 Obesity, unspecified: Secondary | ICD-10-CM | POA: Diagnosis not present

## 2019-07-24 DIAGNOSIS — K219 Gastro-esophageal reflux disease without esophagitis: Secondary | ICD-10-CM | POA: Insufficient documentation

## 2019-07-24 DIAGNOSIS — G4730 Sleep apnea, unspecified: Secondary | ICD-10-CM | POA: Diagnosis not present

## 2019-07-24 NOTE — Progress Notes (Signed)
Daily Session Note  Patient Details  Name: Natasha Boyd MRN: [id number] Date of Birth: 10-10-1970 Referring Provider:     Cardiac Rehab from 07/17/2019 in [facility] Cardiac and Pulmonary Rehab  Referring Provider  Bartholome Bill MD      Encounter Date: 07/24/2019  Check In: Session Check In - 07/24/19 1349      Check-In   Supervising physician immediately available to respond to emergencies  See telemetry face sheet for immediately available ER MD    Location  [facility]-Cardiac & Pulmonary Rehab    Staff Present  Nada Maclachlan, BA, ACSM CEP, Exercise Physiologist;Kabir Brannock Sherryll Burger, RN BSN;Joseph Hood RCP,RRT,BSRT    Virtual Visit  No    Medication changes reported      No    Fall or balance concerns reported     No    Warm-up and Cool-down  Performed on first and last piece of equipment    Resistance Training Performed  Yes    VAD Patient?  No    PAD/SET Patient?  No      Pain Assessment   Currently in Pain?  No/denies          Social History   Tobacco Use  Smoking Status Never Smoker  Smokeless Tobacco Never Used    Goals Met:  Independence with exercise equipment Exercise tolerated well No report of cardiac concerns or symptoms Strength training completed today  Goals Unmet:  Not Applicable  Comments: First full day of exercise!  Patient was oriented to gym and equipment including functions, settings, policies, and procedures.  Patient's individual exercise prescription and treatment plan were reviewed.  All starting workloads were established based on the results of the 6 minute walk test done at initial orientation visit.  The plan for exercise progression was also introduced and progression will be customized based on patient's performance and goals.    Dr. Emily Filbert is Medical Director for [place] and [facility].

## 2019-07-28 ENCOUNTER — Encounter: Payer: 59 | Admitting: *Deleted

## 2019-07-28 ENCOUNTER — Other Ambulatory Visit: Payer: Self-pay

## 2019-07-28 ENCOUNTER — Ambulatory Visit: Payer: 59

## 2019-07-28 DIAGNOSIS — Z952 Presence of prosthetic heart valve: Secondary | ICD-10-CM

## 2019-07-28 NOTE — Progress Notes (Signed)
Daily Session Note  Patient Details  Name: Janayia Burggraf MRN: [id number] Date of Birth: 1970-11-30 Referring Provider:     Cardiac Rehab from 07/17/2019 in [facility] Cardiac and Pulmonary Rehab  Referring Provider  Bartholome Bill MD      Encounter Date: 07/28/2019  Check In: Session Check In - 07/28/19 1320      Check-In   Supervising physician immediately available to respond to emergencies  See telemetry face sheet for immediately available ER MD    Location  [facility]-Cardiac & Pulmonary Rehab    Staff Present  Heath Lark, RN, BSN, CCRP;Melissa Caiola RDN, Wilhelmina Mcardle, BS, ACSM CEP, Exercise Physiologist    Virtual Visit  No    Medication changes reported      No    Fall or balance concerns reported     No    Warm-up and Cool-down  Performed on first and last piece of equipment    Resistance Training Performed  Yes    VAD Patient?  No    PAD/SET Patient?  No      Pain Assessment   Currently in Pain?  No/denies          Social History   Tobacco Use  Smoking Status Never Smoker  Smokeless Tobacco Never Used    Goals Met:  Independence with exercise equipment Exercise tolerated well No report of cardiac concerns or symptoms  Goals Unmet:  Not Applicable  Comments: Pt able to follow exercise prescription today without complaint.  Will continue to monitor for progression.    Dr. Emily Filbert is Medical Director for [place] and [facility].

## 2019-07-31 ENCOUNTER — Other Ambulatory Visit: Payer: Self-pay

## 2019-07-31 ENCOUNTER — Encounter: Payer: 59 | Admitting: *Deleted

## 2019-07-31 DIAGNOSIS — Z952 Presence of prosthetic heart valve: Secondary | ICD-10-CM

## 2019-07-31 NOTE — Progress Notes (Signed)
Daily Session Note  Patient Details  Name: Natasha Boyd MRN: [id number] Date of Birth: 06/15/1971 Referring Provider:     Cardiac Rehab from 07/17/2019 in [facility] Cardiac and Pulmonary Rehab  Referring Provider  Bartholome Bill MD      Encounter Date: 07/31/2019  Check In: Session Check In - 07/31/19 1319      Check-In   Supervising physician immediately available to respond to emergencies  See telemetry face sheet for immediately available ER MD    Location  [facility]-Cardiac & Pulmonary Rehab    Staff Present  Heath Lark, RN, BSN, CCRP;Meredith Sherryll Burger, RN BSN;Joseph Hood RCP,RRT,BSRT    Virtual Visit  No    Medication changes reported      No    Fall or balance concerns reported     No    Warm-up and Cool-down  Performed on first and last piece of equipment    Resistance Training Performed  Yes    VAD Patient?  No    PAD/SET Patient?  No      Pain Assessment   Currently in Pain?  No/denies          Social History   Tobacco Use  Smoking Status Never Smoker  Smokeless Tobacco Never Used    Goals Met:  Independence with exercise equipment Exercise tolerated well No report of cardiac concerns or symptoms  Goals Unmet:  Not Applicable  Comments: Pt able to follow exercise prescription today without complaint.  Will continue to monitor for progression.    Dr. Emily Filbert is Medical Director for [place] and [facility].

## 2019-08-04 ENCOUNTER — Other Ambulatory Visit: Payer: Self-pay

## 2019-08-04 ENCOUNTER — Encounter: Payer: 59 | Admitting: *Deleted

## 2019-08-04 DIAGNOSIS — Z952 Presence of prosthetic heart valve: Secondary | ICD-10-CM

## 2019-08-04 NOTE — Progress Notes (Signed)
Daily Session Note  Patient Details  Name: Natasha Boyd MRN: [id number] Date of Birth: Aug 02, 1970 Referring Provider:     Cardiac Rehab from 07/17/2019 in [facility] Cardiac and Pulmonary Rehab  Referring Provider  Bartholome Bill MD      Encounter Date: 08/04/2019  Check In: Session Check In - 08/04/19 1320      Check-In   Supervising physician immediately available to respond to emergencies  See telemetry face sheet for immediately available ER MD    Location  [facility]-Cardiac & Pulmonary Rehab    Staff Present  Hope Budds RDN, LDN;Korah Hufstedler Sherryll Burger, RN Moises Blood, BS, ACSM CEP, Exercise Physiologist    Virtual Visit  No    Medication changes reported      No    Fall or balance concerns reported     No    Warm-up and Cool-down  Performed on first and last piece of equipment    Resistance Training Performed  Yes    VAD Patient?  No    PAD/SET Patient?  No      Pain Assessment   Currently in Pain?  No/denies          Social History   Tobacco Use  Smoking Status Never Smoker  Smokeless Tobacco Never Used    Goals Met:  Independence with exercise equipment Exercise tolerated well No report of cardiac concerns or symptoms Strength training completed today  Goals Unmet:  Not Applicable  Comments: Pt able to follow exercise prescription today without complaint.  Will continue to monitor for progression. Reviewed home exercise with pt today.  Pt plans to walk, do [employer] and possibly [employer] for exercise.  Reviewed THR, pulse, RPE, sign and symptoms, NTG use, and when to call 911 or MD.  Also discussed weather considerations and indoor options.  Pt voiced understanding.    Dr. Emily Filbert is Medical Director for [place] and [facility].

## 2019-08-07 ENCOUNTER — Ambulatory Visit: Payer: 59

## 2019-08-11 ENCOUNTER — Other Ambulatory Visit: Payer: Self-pay

## 2019-08-11 ENCOUNTER — Encounter: Payer: 59 | Admitting: *Deleted

## 2019-08-11 ENCOUNTER — Ambulatory Visit: Payer: 59

## 2019-08-11 DIAGNOSIS — Z952 Presence of prosthetic heart valve: Secondary | ICD-10-CM

## 2019-08-11 NOTE — Progress Notes (Signed)
Completed Initial RD Eval 

## 2019-08-11 NOTE — Progress Notes (Signed)
Daily Session Note  Patient Details  Name: Natasha Boyd MRN: [id number] Date of Birth: 04/18/1971 Referring Provider:     Cardiac Rehab from 07/17/2019 in [facility] Cardiac and Pulmonary Rehab  Referring Provider  Bartholome Bill MD      Encounter Date: 08/11/2019  Check In: Session Check In - 08/11/19 1302      Check-In   Supervising physician immediately available to respond to emergencies  See telemetry face sheet for immediately available ER MD    Location  [facility]-Cardiac & Pulmonary Rehab    Staff Present  Renita Papa, RN Moises Blood, BS, ACSM CEP, Exercise Physiologist;Amanda Oletta Darter, [place], ACSM CEP, Exercise Physiologist    Virtual Visit  No    Medication changes reported      No    Fall or balance concerns reported     No    Warm-up and Cool-down  Performed on first and last piece of equipment    Resistance Training Performed  Yes    VAD Patient?  No    PAD/SET Patient?  No      Pain Assessment   Currently in Pain?  No/denies          Social History   Tobacco Use  Smoking Status Never Smoker  Smokeless Tobacco Never Used    Goals Met:  Independence with exercise equipment Exercise tolerated well No report of cardiac concerns or symptoms Strength training completed today  Goals Unmet:  Not Applicable  Comments: Pt able to follow exercise prescription today without complaint.  Will continue to monitor for progression.    Dr. Emily Filbert is Medical Director for [place] and [facility].

## 2019-08-13 ENCOUNTER — Encounter: Payer: Self-pay | Admitting: *Deleted

## 2019-08-13 DIAGNOSIS — Z952 Presence of prosthetic heart valve: Secondary | ICD-10-CM

## 2019-08-13 NOTE — Progress Notes (Signed)
Cardiac Individual Treatment Plan  Patient Details  Name: Natasha Boyd MRN: [id number] Date of Birth: 1970-11-28 Referring Provider:     Cardiac Rehab from 07/17/2019 in [facility] Cardiac and Pulmonary Rehab  Referring Provider  Bartholome Bill MD      Initial Encounter Date:    Cardiac Rehab from 07/17/2019 in [facility] Cardiac and Pulmonary Rehab  Date  07/17/19      Visit Diagnosis: S/P aortic valve replacement  Patient's Home Medications on Admission:  Current Outpatient Medications:  .  amLODipine (NORVASC) 5 MG tablet, Take 5 mg by mouth at bedtime. , Disp: , Rfl:  .  escitalopram (LEXAPRO) 20 MG tablet, TAKE 1 TABLET(20 MG) BY MOUTH DAILY (Patient taking differently: Take 20 mg by mouth at bedtime. ), Disp: 30 tablet, Rfl: 5 .  fluticasone (FLONASE) 50 MCG/ACT nasal spray, Place 2 sprays into the nose as needed for allergies. , Disp: , Rfl:  .  furosemide (LASIX) 40 MG tablet, Take by mouth., Disp: , Rfl:  .  hydrochlorothiazide (HYDRODIURIL) 25 MG tablet, Take 25 mg by mouth daily., Disp: , Rfl:  .  lansoprazole (PREVACID) 30 MG capsule, Take 1 capsule (30 mg total) by mouth daily at 12 noon. (Patient taking differently: Take 30 mg by mouth as needed (Acid reflux). ), Disp: 30 capsule, Rfl: 5 .  metoprolol tartrate (LOPRESSOR) 25 MG tablet, Take 0.5 tablets (12.5 mg total) by mouth 2 (two) times daily., Disp: 30 tablet, Rfl: 5 .  metoprolol tartrate (LOPRESSOR) 25 MG tablet, Take by mouth., Disp: , Rfl:  .  Potassium Chloride 40 MEQ/15ML (20%) SOLN, Take by mouth., Disp: , Rfl:  .  warfarin (COUMADIN) 2 MG tablet, Take by mouth., Disp: , Rfl:  No current facility-administered medications for this visit.  Facility-Administered Medications Ordered in Other Visits:  .  sodium chloride flush (NS) 0.9 % injection 3 mL, 3 mL, Intravenous, Q12H, Fath, Javier Docker, MD  Past Medical History: Past Medical History:  Diagnosis Date  . Allergic rhinitis   . Anginal pain ([place])   . Anxiety    . Aortic valve stenosis   . Benign neoplasm of skin   . Dyspnea   . GERD (gastroesophageal reflux disease)   . Heart murmur   . Obesity   . Polydipsia   . Sleep apnea    uses CPAP at night    Tobacco Use: Social History   Tobacco Use  Smoking Status Never Smoker  Smokeless Tobacco Never Used    Labs: Recent Review Flowsheet Data    Labs for ITP Cardiac and Pulmonary Rehab Latest Ref Rng & Units 03/16/2014 08/28/2014 11/18/2015 12/12/2017   Cholestrol 100 - 199 mg/dL 201(A) - 191 223(H)   LDLCALC 0 - 99 mg/dL 125 - 122(H) 149(H)   HDL >39 mg/dL 34(A) - 35(L) 43   Trlycerides 0 - 149 mg/dL 209(A) - 171(H) 155(H)   Hemoglobin A1c 4.0 - 6.0 % - 5.7 - -       Exercise Target Goals: Exercise Program Goal: Individual exercise prescription set using results from initial 6 min walk test and THRR while considering  patient's activity barriers and safety.   Exercise Prescription Goal: Initial exercise prescription builds to 30-45 minutes a day of aerobic activity, 2-3 days per week.  Home exercise guidelines will be given to patient during program as part of exercise prescription that the participant will acknowledge.  Activity Barriers & Risk Stratification: Activity Barriers & Cardiac Risk Stratification - 07/17/19 1623  Activity Barriers & Cardiac Risk Stratification   Activity Barriers  Deconditioning;Muscular Weakness;Other (comment)    Comments  incision pulls but not painful    Cardiac Risk Stratification  Low       6 Minute Walk: 6 Minute Walk    Row Name 07/17/19 1622         6 Minute Walk   Phase  Initial     Distance  1195 feet     Walk Time  6 minutes     # of Rest Breaks  0     MPH  2.26     METS  3.29     RPE  13     Perceived Dyspnea   1     VO2 Peak  11.54     Symptoms  Yes (comment)     Comments  SOB     Resting HR  87 bpm     Resting BP  114/74     Resting Oxygen Saturation   96 %     Exercise Oxygen Saturation  during 6 min walk  96 %      Max Ex. HR  113 bpm     Max Ex. BP  174/76     2 Minute Post BP  136/66        Oxygen Initial Assessment:   Oxygen Re-Evaluation:   Oxygen Discharge (Final Oxygen Re-Evaluation):   Initial Exercise Prescription: Initial Exercise Prescription - 07/17/19 1600      Date of Initial Exercise RX and Referring Provider   Date  07/17/19    Referring Provider  Bartholome Bill MD      Treadmill   MPH  2.1    Grade  0.5    Minutes  15    METs  2.75      NuStep   Level  3    SPM  80    Minutes  15    METs  2.7      Elliptical   Level  1    Speed  3    Minutes  15      REL-XR   Level  2    Speed  50    Minutes  15    METs  2.7      T5 Nustep   Level  3    SPM  80    Minutes  15    METs  2.7      Prescription Details   Frequency (times per week)  1    Duration  Progress to 30 minutes of continuous aerobic without signs/symptoms of physical distress      Intensity   THRR 40-80% of Max Heartrate  121-154    Ratings of Perceived Exertion  11-13    Perceived Dyspnea  0-4      Progression   Progression  Continue to progress workloads to maintain intensity without signs/symptoms of physical distress.      Resistance Training   Training Prescription  Yes    Weight  3 lb    Reps  10-15       Perform Capillary Blood Glucose checks as needed.  Exercise Prescription Changes: Exercise Prescription Changes    Row Name 07/17/19 1600 08/05/19 1300           Response to Exercise   Blood Pressure (Admit)  114/74  122/72      Blood Pressure (Exercise)  174/76  160/80      Blood Pressure (  Exit)  136/66  138/88      Heart Rate (Admit)  87 bpm  93 bpm      Heart Rate (Exercise)  113 bpm  136 bpm      Heart Rate (Exit)  95 bpm  102 bpm      Oxygen Saturation (Admit)  96 %  --      Oxygen Saturation (Exercise)  96 %  --      Rating of Perceived Exertion (Exercise)  13  15      Perceived Dyspnea (Exercise)  1  --      Symptoms  SOB  --      Comments  walk test  results  --      Duration  --  Continue with 30 min of aerobic exercise without signs/symptoms of physical distress.      Intensity  --  THRR unchanged        Progression   Progression  --  Continue to progress workloads to maintain intensity without signs/symptoms of physical distress.      Average METs  --  3.4        Resistance Training   Training Prescription  --  Yes      Weight  --  5 lb      Reps  --  10-15        Treadmill   MPH  --  2.1      Grade  --  0.5      Minutes  --  15      METs  --  2.75        Elliptical   Level  --  1      Speed  --  3      Minutes  --  15         Exercise Comments:   Exercise Goals and Review: Exercise Goals    Row Name 07/17/19 1625             Exercise Goals   Increase Physical Activity  Yes       Intervention  Provide advice, education, support and counseling about physical activity/exercise needs.;Develop an individualized exercise prescription for aerobic and resistive training based on initial evaluation findings, risk stratification, comorbidities and participant's personal goals.       Expected Outcomes  Short Term: Attend rehab on a regular basis to increase amount of physical activity.;Long Term: Add in home exercise to make exercise part of routine and to increase amount of physical activity.;Long Term: Exercising regularly at least 3-5 days a week.       Increase Strength and Stamina  Yes       Intervention  Provide advice, education, support and counseling about physical activity/exercise needs.;Develop an individualized exercise prescription for aerobic and resistive training based on initial evaluation findings, risk stratification, comorbidities and participant's personal goals.       Expected Outcomes  Short Term: Increase workloads from initial exercise prescription for resistance, speed, and METs.;Short Term: Perform resistance training exercises routinely during rehab and add in resistance training at home;Long Term:  Improve cardiorespiratory fitness, muscular endurance and strength as measured by increased METs and functional capacity (6MWT)       Able to understand and use rate of perceived exertion (RPE) scale  Yes       Intervention  Provide education and explanation on how to use RPE scale       Expected Outcomes  Short Term: Able to  use RPE daily in rehab to express subjective intensity level;Long Term:  Able to use RPE to guide intensity level when exercising independently       Able to understand and use Dyspnea scale  Yes       Intervention  Provide education and explanation on how to use Dyspnea scale       Expected Outcomes  Short Term: Able to use Dyspnea scale daily in rehab to express subjective sense of shortness of breath during exertion;Long Term: Able to use Dyspnea scale to guide intensity level when exercising independently       Knowledge and understanding of Target Heart Rate Range (THRR)  Yes       Intervention  Provide education and explanation of THRR including how the numbers were predicted and where they are located for reference       Expected Outcomes  Short Term: Able to state/look up THRR;Short Term: Able to use daily as guideline for intensity in rehab;Long Term: Able to use THRR to govern intensity when exercising independently       Able to check pulse independently  Yes       Intervention  Provide education and demonstration on how to check pulse in carotid and radial arteries.;Review the importance of being able to check your own pulse for safety during independent exercise       Expected Outcomes  Short Term: Able to explain why pulse checking is important during independent exercise;Long Term: Able to check pulse independently and accurately       Understanding of Exercise Prescription  Yes       Intervention  Provide education, explanation, and written materials on patient's individual exercise prescription       Expected Outcomes  Short Term: Able to explain program exercise  prescription;Long Term: Able to explain home exercise prescription to exercise independently          Exercise Goals Re-Evaluation : Exercise Goals Re-Evaluation    Row Name 07/24/19 1351 08/04/19 1342 08/05/19 1304         Exercise Goal Re-Evaluation   Exercise Goals Review  Increase Physical Activity;Able to understand and use rate of perceived exertion (RPE) scale;Knowledge and understanding of Target Heart Rate Range (THRR);Understanding of Exercise Prescription;Increase Strength and Stamina;Able to check pulse independently  Increase Physical Activity;Increase Strength and Stamina;Able to understand and use rate of perceived exertion (RPE) scale;Able to understand and use Dyspnea scale;Knowledge and understanding of Target Heart Rate Range (THRR);Able to check pulse independently;Understanding of Exercise Prescription  Increase Physical Activity;Increase Strength and Stamina;Able to understand and use rate of perceived exertion (RPE) scale;Able to understand and use Dyspnea scale;Knowledge and understanding of Target Heart Rate Range (THRR);Able to check pulse independently;Understanding of Exercise Prescription     Comments  Reviewed RPE scale, THR and program prescription with pt today.  Pt voiced understanding and was given a copy of goals to take home.  Reviewed home exercise with pt today.  Pt plans to walk, do [employer] and possibly [employer] for exercise.  Reviewed THR, pulse, RPE, sign and symptoms, NTG use, and when to call 911 or MD.  Also discussed weather considerations and indoor options.  Pt voiced understanding.  --     Expected Outcomes  Short: Use RPE daily to regulate intensity. Long: Follow program prescription in THR.  Short - exercise 30-5 days per week consistently Long : improve overall stamina  --        Discharge Exercise Prescription (Final Exercise Prescription  Changes): Exercise Prescription Changes - 08/05/19 1300      Response to Exercise   Blood Pressure  (Admit)  122/72    Blood Pressure (Exercise)  160/80    Blood Pressure (Exit)  138/88    Heart Rate (Admit)  93 bpm    Heart Rate (Exercise)  136 bpm    Heart Rate (Exit)  102 bpm    Rating of Perceived Exertion (Exercise)  15    Duration  Continue with 30 min of aerobic exercise without signs/symptoms of physical distress.    Intensity  THRR unchanged      Progression   Progression  Continue to progress workloads to maintain intensity without signs/symptoms of physical distress.    Average METs  3.4      Resistance Training   Training Prescription  Yes    Weight  5 lb    Reps  10-15      Treadmill   MPH  2.1    Grade  0.5    Minutes  15    METs  2.75      Elliptical   Level  1    Speed  3    Minutes  15       Nutrition:  Target Goals: Understanding of nutrition guidelines, daily intake of sodium <1568m, cholesterol <2063m calories 30% from fat and 7% or less from saturated fats, daily to have 5 or more servings of fruits and vegetables.  Biometrics: Pre Biometrics - 07/17/19 1625      Pre Biometrics   Height  5' 5.5" (1.664 m)    Weight  280 lb 9.6 oz (127.3 kg)    BMI (Calculated)  45.97    Single Leg Stand  30 seconds        Nutrition Therapy Plan and Nutrition Goals: Nutrition Therapy & Goals - 08/11/19 1502      Nutrition Therapy   Drug/Food Interactions  Coumadin/Vit K      Personal Nutrition Goals   Nutrition Goal  ST: add more satiating snacks LT: get into good routine    Comments  B: Slimfast 2 cups coffee (7am) MS: sargento balance breaks L: sandwich, chips, and carrots (wheat) - PBJ or tuKuwaitr soup (microwavable ones) or salad MS: chocolate or skinny pop popcorn D: varies pt states that her downfall is CHOs S: chocolate, wine, popcorn. Pt reports drinking water, diet cran-cherry 1 glass with dinner. Pt reports "stress eating" on some days. Discussed HH eating and balanced eating.      Intervention Plan   Intervention  Prescribe, educate and  counsel regarding individualized specific dietary modifications aiming towards targeted core components such as weight, hypertension, lipid management, diabetes, heart failure and other comorbidities.;Nutrition handout(s) given to patient.    Expected Outcomes  Short Term Goal: Understand basic principles of dietary content, such as calories, fat, sodium, cholesterol and nutrients.;Short Term Goal: A plan has been developed with personal nutrition goals set during dietitian appointment.;Long Term Goal: Adherence to prescribed nutrition plan.       Nutrition Assessments: Nutrition Assessments - 07/18/19 0747      MEDFICTS Scores   Pre Score  66       Nutrition Goals Re-Evaluation:   Nutrition Goals Discharge (Final Nutrition Goals Re-Evaluation):   Psychosocial: Target Goals: Acknowledge presence or absence of significant depression and/or stress, maximize coping skills, provide positive support system. Participant is able to verbalize types and ability to use techniques and skills needed for reducing stress and depression.  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  Yes   husband, mom, best friends, kid (freshman in college and 8th grader)   Comments  family very loving      Barriers   Psychosocial barriers to participate in program  The patient should benefit from training in stress management and relaxation.;Psychosocial barriers identified (see note)      Screening Interventions   Interventions  Encouraged to exercise;Provide feedback about  the scores to participant;To provide support and resources with identified psychosocial needs    Expected Outcomes  Short Term goal: Utilizing psychosocial counselor, staff and physician to assist with identification of specific Stressors or current issues interfering with healing process. Setting desired goal for each stressor or current issue identified.;Long Term Goal: Stressors or current issues are controlled or eliminated.;Short Term goal: Identification and review with participant of any Quality of Life or Depression concerns found by scoring the questionnaire.;Long Term goal: The participant improves quality of Life and PHQ9 Scores as seen by post scores and/or verbalization of changes       Quality of Life Scores:  Quality of Life - 07/18/19 0742      Quality of Life   Select  Quality of Life      Quality of Life Scores   Health/Function Pre  21.83 %    Socioeconomic Pre  27.57 %    Psych/Spiritual Pre  21.79 %    Family Pre  28.8 %    GLOBAL Pre  24.03 %      Scores of 19 and below usually indicate a poorer quality of life in these areas.  A difference of  2-3 points is a clinically meaningful difference.  A difference of 2-3 points in the total score of the Quality of Life Index has been associated with significant improvement in overall quality of life, self-image, physical symptoms, and general health in studies assessing change in quality of life.  PHQ-9: Recent Review Flowsheet Data    Depression screen [facility] 2/9 07/18/2019 01/06/2019 12/06/2017 08/15/2016   Decreased Interest 0 0 1 1   Down, Depressed, Hopeless 0 1 0 1   PHQ - 2 Score 0 _0 Altered sleeping _1 Tired, decreased energy 1 0 2 2   Change in appetite 0 0 3 0   Feeling bad or failure about yourself  0 0 0 2   Trouble concentrating 0 0 1 1   Moving slowly or fidgety/restless 0 0 0 0   Suicidal thoughts 0 0 0 0   PHQ-9 Score _2 Difficult doing work/chores Not difficult at all Somewhat difficult  Somewhat difficult -     Interpretation of Total Score  Total Score Depression Severity:  1-4 = Minimal depression, 5-9 = Mild depression, 10-14 = Moderate depression, 15-19 = Moderately severe depression, 20-27 = Severe depression   Psychosocial Evaluation and Intervention: Psychosocial Evaluation - 07/16/19 1103      Psychosocial Evaluation & Interventions   Interventions  Stress management education;Encouraged  to exercise with the program and follow exercise prescription    Comments  Adalia is coming into cardiac rehab after a valve replacement.  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  She is eager to return to school part time soon and hopefully back in person in March. She misses her kids and seeing them in person.  She has a great support system and loving family that lifts her up daily.  She has a history of anxiety but is well managed on her lexapro currently.  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  She wants to lose weight and get into a good routine for exercise and diet again.  She also wants to rebuild her stamina to be able to make it through a day.    Expected Outcomes  Short: Attend rehab regularly to build stamina.  Long: Continue to stay positive and learn new lifestyle to maintain health.    Continue Psychosocial Services   Follow up required by staff       Psychosocial Re-Evaluation: Psychosocial Re-Evaluation    [place] Name 08/04/19 1336             Psychosocial Re-Evaluation   Current issues with  Current Stress Concerns       Comments  Doralyn has started back to work half days.  She will go back to full days in a couple weeks.  We discussed planning exercise to stay on track.       Expected Outcomes  Short - manage stress going back to work Long - maintain positive attitude          Psychosocial Discharge (Final Psychosocial  Re-Evaluation): Psychosocial Re-Evaluation - 08/04/19 1336      Psychosocial Re-Evaluation   Current issues with  Current Stress Concerns    Comments  Davionna has started back to work half days.  She will go back to full days in a couple weeks.  We discussed planning exercise to stay on track.    Expected Outcomes  Short - manage stress going back to work Long - maintain positive attitude       Vocational Rehabilitation: Provide vocational rehab assistance to qualifying candidates.   Vocational Rehab Evaluation & Intervention: Vocational Rehab - 07/16/19 1048      Initial Vocational Rehab Evaluation & Intervention   Assessment shows need for Vocational Rehabilitation  No   hopes to return to to half days in two weeks      Education: Education Goals: Education classes will be provided on a variety of topics geared toward better understanding of heart health and risk factor modification. Participant will state understanding/return demonstration of topics presented as noted by education test scores.  Learning Barriers/Preferences: Learning Barriers/Preferences - 07/16/19 1050      Learning Barriers/Preferences   Learning Barriers  Sight   glasses for distance   Learning Preferences  None       Education Topics:  AED/CPR: - Group verbal and written instruction with the use of models to demonstrate the basic use of the AED with the basic ABC's of resuscitation.   General Nutrition Guidelines/Fats and Fiber: -Group instruction provided by verbal, written material, models and posters to present the general guidelines for heart healthy nutrition. Gives an explanation and review of dietary fats and fiber.   Controlling Sodium/Reading Food Labels: -Group verbal and written material supporting  the discussion of sodium use in heart healthy nutrition. Review and explanation with models, verbal and written materials for utilization of the food label.   Exercise Physiology & General  Exercise Guidelines: - Group verbal and written instruction with models to review the exercise physiology of the cardiovascular system and associated critical values. Provides general exercise guidelines with specific guidelines to those with heart or lung disease.    Aerobic Exercise & Resistance Training: - Gives group verbal and written instruction on the various components of exercise. Focuses on aerobic and resistive training programs and the benefits of this training and how to safely progress through these programs..   Flexibility, Balance, Mind/Body Relaxation: Provides group verbal/written instruction on the benefits of flexibility and balance training, including mind/body exercise modes such as yoga, pilates and tai chi.  Demonstration and skill practice provided.   Stress and Anxiety: - Provides group verbal and written instruction about the health risks of elevated stress and causes of high stress.  Discuss the correlation between heart/lung disease and anxiety and treatment options. Review healthy ways to manage with stress and anxiety.   Depression: - Provides group verbal and written instruction on the correlation between heart/lung disease and depressed mood, treatment options, and the stigmas associated with seeking treatment.   Anatomy & Physiology of the Heart: - Group verbal and written instruction and models provide basic cardiac anatomy and physiology, with the coronary electrical and arterial systems. Review of Valvular disease and Heart Failure   Cardiac Procedures: - Group verbal and written instruction to review commonly prescribed medications for heart disease. Reviews the medication, class of the drug, and side effects. Includes the steps to properly store meds and maintain the prescription regimen. (beta blockers and nitrates)   Cardiac Medications I: - Group verbal and written instruction to review commonly prescribed medications for heart disease. Reviews  the medication, class of the drug, and side effects. Includes the steps to properly store meds and maintain the prescription regimen.   Cardiac Medications II: -Group verbal and written instruction to review commonly prescribed medications for heart disease. Reviews the medication, class of the drug, and side effects. (all other drug classes)    Go Sex-Intimacy & Heart Disease, Get SMART - Goal Setting: - Group verbal and written instruction through game format to discuss heart disease and the return to sexual intimacy. Provides group verbal and written material to discuss and apply goal setting through the application of the S.M.A.R.T. Method.   Other Matters of the Heart: - Provides group verbal, written materials and models to describe Stable Angina and Peripheral Artery. Includes description of the disease process and treatment options available to the cardiac patient.   Exercise & Equipment Safety: - Individual verbal instruction and demonstration of equipment use and safety with use of the equipment.   Cardiac Rehab from 07/17/2019 in [facility] Cardiac and Pulmonary Rehab  Date  07/17/19  Educator  [facility]  Instruction Review Code  1- Verbalizes Understanding      Infection Prevention: - Provides verbal and written material to individual with discussion of infection control including proper hand washing and proper equipment cleaning during exercise session.   Cardiac Rehab from 07/17/2019 in [facility] Cardiac and Pulmonary Rehab  Date  07/17/19  Educator  [facility]  Instruction Review Code  1- Verbalizes Understanding      Falls Prevention: - Provides verbal and written material to individual with discussion of falls prevention and safety.   Cardiac Rehab from 07/17/2019 in [facility] Cardiac and  Pulmonary Rehab  Date  07/17/19  Educator  [facility]  Instruction Review Code  1- Verbalizes Understanding      Diabetes: - Individual verbal and written instruction to review signs/symptoms of diabetes, desired  ranges of glucose level fasting, after meals and with exercise. Acknowledge that pre and post exercise glucose checks will be done for 3 sessions at entry of program.   Know Your Numbers and Risk Factors: -Group verbal and written instruction about important numbers in your health.  Discussion of what are risk factors and how they play a role in the disease process.  Review of Cholesterol, Blood Pressure, Diabetes, and BMI and the role they play in your overall health.   Sleep Hygiene: -Provides group verbal and written instruction about how sleep can affect your health.  Define sleep hygiene, discuss sleep cycles and impact of sleep habits. Review good sleep hygiene tips.    Other: -Provides group and verbal instruction on various topics (see comments)   Knowledge Questionnaire Score: Knowledge Questionnaire Score - 07/18/19 0747      Knowledge Questionnaire Score   Pre Score  24/26 Education Focus: PAD, [place]       Core Components/Risk Factors/Patient Goals at Admission: Personal Goals and Risk Factors at Admission - 07/18/19 0748      Core Components/Risk Factors/Patient Goals on Admission    Weight Management  Yes;Obesity;Weight Loss    Intervention  Weight Management: Develop a combined nutrition and exercise program designed to reach desired caloric intake, while maintaining appropriate intake of nutrient and fiber, sodium and fats, and appropriate energy expenditure required for the weight goal.;Weight Management: Provide education and appropriate resources to help participant work on and attain dietary goals.;Weight Management/Obesity: Establish reasonable short term and long term weight goals.;Obesity: Provide education and appropriate resources to help participant work on and attain dietary goals.    Admit Weight  280 lb 9.6 oz (127.3 kg)    Goal Weight: Short Term  275 lb (124.7 kg)    Goal Weight: Long Term  250 lb (113.4 kg)    Expected Outcomes  Short Term: Continue to  assess and modify interventions until short term weight is achieved;Long Term: Adherence to nutrition and physical activity/exercise program aimed toward attainment of established weight goal;Weight Loss: Understanding of general recommendations for a balanced deficit meal plan, which promotes 1-2 lb weight loss per week and includes a negative energy balance of [phone] kcal/d;Understanding recommendations for meals to include 15-35% energy as protein, 25-35% energy from fat, 35-60% energy from carbohydrates, less than 268m of dietary cholesterol, 20-35 gm of total fiber daily;Understanding of distribution of calorie intake throughout the day with the consumption of 4-5 meals/snacks    Lipids  Yes    Intervention  Provide education and support for participant on nutrition & aerobic/resistive exercise along with prescribed medications to achieve LDL <776m HDL >4062m   Expected Outcomes  Short Term: Participant states understanding of desired cholesterol values and is compliant with medications prescribed. Participant is following exercise prescription and nutrition guidelines.;Long Term: Cholesterol controlled with medications as prescribed, with individualized exercise RX and with personalized nutrition plan. Value goals: LDL < 33m17mDL > 40 mg.       Core Components/Risk Factors/Patient Goals Review:  Goals and Risk Factor Review    Row Name 08/04/19 1329             Core Components/Risk Factors/Patient Goals Review   Personal Goals Review  Weight Management/Obesity;Improve shortness of breath with ADL's  Review  Porshe is taking meds as directed.  She states she has more energy in general and sleeps better.  She had some insomnia after surgery.  She is enjoying getting back to exercise       Expected Outcomes  Short - exercise consistently on days not at [facility] - improve overall stamina          Core Components/Risk Factors/Patient Goals at Discharge (Final Review):  Goals and Risk  Factor Review - 08/04/19 1329      Core Components/Risk Factors/Patient Goals Review   Personal Goals Review  Weight Management/Obesity;Improve shortness of breath with ADL's    Review  Devlyn is taking meds as directed.  She states she has more energy in general and sleeps better.  She had some insomnia after surgery.  She is enjoying getting back to exercise    Expected Outcomes  Short - exercise consistently on days not at [facility] - improve overall stamina       ITP Comments: ITP Comments    Row Name 07/16/19 1110 07/17/19 1622 07/24/19 1350 08/11/19 1534 08/13/19 0650   ITP Comments  Completed virtual orientation today.  EP eval scheduled for 1/29 at 1330. Documentation for diagnosis can be found in Care Everywhere encounter 06/04/19.  Completed 6MWT and gym orientation.  Initial ITP created and sent for review to Dr. Emily Filbert, Medical Director.  First full day of exercise!  Patient was oriented to gym and equipment including functions, settings, policies, and procedures.  Patient's individual exercise prescription and treatment plan were reviewed.  All starting workloads were established based on the results of the 6 minute walk test done at initial orientation visit.  The plan for exercise progression was also introduced and progression will be customized based on patient's performance and goals.  Completed Initial RD Eval  30 day chart review completed. ITP sent to Dr Zachery Dakins Medical Director, for review,changes as needed and signature.      Comments:

## 2019-08-14 ENCOUNTER — Encounter: Payer: 59 | Admitting: *Deleted

## 2019-08-14 ENCOUNTER — Other Ambulatory Visit: Payer: Self-pay

## 2019-08-14 DIAGNOSIS — Z952 Presence of prosthetic heart valve: Secondary | ICD-10-CM | POA: Diagnosis not present

## 2019-08-14 NOTE — Progress Notes (Signed)
Daily Session Note  Patient Details  Name: Natasha Boyd MRN: [id number] Date of Birth: 08/22/1970 Referring Provider:     Cardiac Rehab from 07/17/2019 in [facility] Cardiac and Pulmonary Rehab  Referring Provider  Bartholome Bill MD      Encounter Date: 08/14/2019  Check In: Session Check In - 08/14/19 1315      Check-In   Supervising physician immediately available to respond to emergencies  See telemetry face sheet for immediately available ER MD    Location  [facility]-Cardiac & Pulmonary Rehab    Staff Present  Heath Lark, RN, BSN, CCRP;Meredith Sherryll Burger, RN BSN;Jessica Exeter, MA, RCEP, CCRP, [place], [place], ACSM CEP, Exercise Physiologist    Virtual Visit  No    Medication changes reported      No    Fall or balance concerns reported     No    Warm-up and Cool-down  Performed on first and last piece of equipment    Resistance Training Performed  Yes    VAD Patient?  No    PAD/SET Patient?  No      Pain Assessment   Currently in Pain?  No/denies          Social History   Tobacco Use  Smoking Status Never Smoker  Smokeless Tobacco Never Used    Goals Met:  Independence with exercise equipment Exercise tolerated well No report of cardiac concerns or symptoms  Goals Unmet:  Not Applicable  Comments: Pt able to follow exercise prescription today without complaint.  Will continue to monitor for progression.    Dr. Emily Filbert is Medical Director for [place] and [facility].

## 2019-08-18 ENCOUNTER — Encounter: Payer: 59 | Attending: Cardiology

## 2019-08-18 DIAGNOSIS — Z7901 Long term (current) use of anticoagulants: Secondary | ICD-10-CM | POA: Insufficient documentation

## 2019-08-18 DIAGNOSIS — K219 Gastro-esophageal reflux disease without esophagitis: Secondary | ICD-10-CM | POA: Insufficient documentation

## 2019-08-18 DIAGNOSIS — Z79899 Other long term (current) drug therapy: Secondary | ICD-10-CM | POA: Insufficient documentation

## 2019-08-18 DIAGNOSIS — E669 Obesity, unspecified: Secondary | ICD-10-CM | POA: Insufficient documentation

## 2019-08-18 DIAGNOSIS — Z952 Presence of prosthetic heart valve: Secondary | ICD-10-CM | POA: Insufficient documentation

## 2019-08-18 DIAGNOSIS — Z6841 Body Mass Index (BMI) 40.0 and over, adult: Secondary | ICD-10-CM | POA: Insufficient documentation

## 2019-08-18 DIAGNOSIS — F419 Anxiety disorder, unspecified: Secondary | ICD-10-CM | POA: Insufficient documentation

## 2019-08-18 DIAGNOSIS — G4730 Sleep apnea, unspecified: Secondary | ICD-10-CM | POA: Insufficient documentation

## 2019-08-20 ENCOUNTER — Other Ambulatory Visit: Payer: Self-pay

## 2019-08-20 ENCOUNTER — Encounter: Payer: 59 | Admitting: *Deleted

## 2019-08-20 DIAGNOSIS — G4730 Sleep apnea, unspecified: Secondary | ICD-10-CM | POA: Diagnosis not present

## 2019-08-20 DIAGNOSIS — Z6841 Body Mass Index (BMI) 40.0 and over, adult: Secondary | ICD-10-CM | POA: Diagnosis not present

## 2019-08-20 DIAGNOSIS — Z79899 Other long term (current) drug therapy: Secondary | ICD-10-CM | POA: Diagnosis not present

## 2019-08-20 DIAGNOSIS — E669 Obesity, unspecified: Secondary | ICD-10-CM | POA: Diagnosis not present

## 2019-08-20 DIAGNOSIS — K219 Gastro-esophageal reflux disease without esophagitis: Secondary | ICD-10-CM | POA: Diagnosis not present

## 2019-08-20 DIAGNOSIS — Z952 Presence of prosthetic heart valve: Secondary | ICD-10-CM | POA: Diagnosis present

## 2019-08-20 DIAGNOSIS — Z7901 Long term (current) use of anticoagulants: Secondary | ICD-10-CM | POA: Diagnosis not present

## 2019-08-20 DIAGNOSIS — F419 Anxiety disorder, unspecified: Secondary | ICD-10-CM | POA: Diagnosis not present

## 2019-08-20 NOTE — Progress Notes (Signed)
Daily Session Note  Patient Details  Name: Natasha Boyd MRN: [id number] Date of Birth: February 22, 1971 Referring Provider:     Cardiac Rehab from 07/17/2019 in [facility] Cardiac and Pulmonary Rehab  Referring Provider  Bartholome Bill MD      Encounter Date: 08/20/2019  Check In: Session Check In - 08/20/19 1549      Check-In   Supervising physician immediately available to respond to emergencies  See telemetry face sheet for immediately available ER MD    Location  [facility]-Cardiac & Pulmonary Rehab    Staff Present  Nada Maclachlan, BA, ACSM CEP, Exercise Physiologist;Melissa Caiola RDN, LDN;Julee Stoll Sherryll Burger, RN BSN    Virtual Visit  No    Medication changes reported      No    Fall or balance concerns reported     No    Warm-up and Cool-down  Performed on first and last piece of equipment    Resistance Training Performed  Yes    VAD Patient?  No    PAD/SET Patient?  No      Pain Assessment   Currently in Pain?  No/denies          Social History   Tobacco Use  Smoking Status Never Smoker  Smokeless Tobacco Never Used    Goals Met:  Independence with exercise equipment Exercise tolerated well No report of cardiac concerns or symptoms Strength training completed today  Goals Unmet:  Not Applicable  Comments: Pt able to follow exercise prescription today without complaint.  Will continue to monitor for progression.    Dr. Emily Filbert is Medical Director for [place] and [facility].

## 2019-08-23 ENCOUNTER — Ambulatory Visit: Payer: 59 | Attending: Internal Medicine

## 2019-08-23 ENCOUNTER — Other Ambulatory Visit: Payer: Self-pay

## 2019-08-23 DIAGNOSIS — Z23 Encounter for immunization: Secondary | ICD-10-CM | POA: Insufficient documentation

## 2019-08-23 NOTE — Progress Notes (Signed)
   Covid-19 Vaccination Clinic  Name:  Natasha Boyd    MRN: [id number] DOB: [birth year]  08/23/2019  [name] was observed post Covid-19 immunization for 15 minutes without incident. She was provided with Vaccine Information Sheet and instruction to access the V-Safe system.   [name] was instructed to call 911 with any severe reactions post vaccine: Marland Kitchen Difficulty breathing  . Swelling of face and throat  . A fast heartbeat  . A bad rash all over body  . Dizziness and weakness   Immunizations Administered    Name Date Dose VIS Date Route   Moderna COVID-19 Vaccine 08/23/2019  2:51 PM 0.5 mL 05/20/2019 Intramuscular   Manufacturer: Moderna   Lot: [id number]   [id number]

## 2019-08-28 ENCOUNTER — Other Ambulatory Visit: Payer: Self-pay

## 2019-08-28 ENCOUNTER — Encounter: Payer: 59 | Admitting: *Deleted

## 2019-08-28 DIAGNOSIS — Z952 Presence of prosthetic heart valve: Secondary | ICD-10-CM | POA: Diagnosis not present

## 2019-08-28 NOTE — Progress Notes (Signed)
Daily Session Note  Patient Details  Name: Natasha Boyd MRN: [id number] Date of Birth: 1970-10-20 Referring Provider:     Cardiac Rehab from 07/17/2019 in [facility] Cardiac and Pulmonary Rehab  Referring Provider  Bartholome Bill MD      Encounter Date: 08/28/2019  Check In: Session Check In - 08/28/19 1534      Check-In   Supervising physician immediately available to respond to emergencies  See telemetry face sheet for immediately available ER MD    Location  [facility]-Cardiac & Pulmonary Rehab    Staff Present  Renita Papa, RN BSN;Joseph [address] [place], [place], [place], CCRP, CCET    Virtual Visit  No    Medication changes reported      No    Fall or balance concerns reported     No    Warm-up and Cool-down  Performed on first and last piece of equipment    Resistance Training Performed  Yes    VAD Patient?  No    PAD/SET Patient?  No      Pain Assessment   Currently in Pain?  No/denies          Social History   Tobacco Use  Smoking Status Never Smoker  Smokeless Tobacco Never Used    Goals Met:  Independence with exercise equipment Exercise tolerated well No report of cardiac concerns or symptoms Strength training completed today  Goals Unmet:  Not Applicable  Comments: Pt able to follow exercise prescription today without complaint.  Will continue to monitor for progression.    Dr. Emily Filbert is Medical Director for [place] and [facility].

## 2019-09-10 ENCOUNTER — Encounter: Payer: Self-pay | Admitting: *Deleted

## 2019-09-10 DIAGNOSIS — Z952 Presence of prosthetic heart valve: Secondary | ICD-10-CM

## 2019-09-10 NOTE — Progress Notes (Signed)
Cardiac Individual Treatment Plan  Patient Details  Name: Natasha Boyd MRN: [id number] Date of Birth: 1970-11-28 Referring Provider:     Cardiac Rehab from 07/17/2019 in [facility] Cardiac and Pulmonary Rehab  Referring Provider  Bartholome Bill MD      Initial Encounter Date:    Cardiac Rehab from 07/17/2019 in [facility] Cardiac and Pulmonary Rehab  Date  07/17/19      Visit Diagnosis: S/P aortic valve replacement  Patient's Home Medications on Admission:  Current Outpatient Medications:  .  amLODipine (NORVASC) 5 MG tablet, Take 5 mg by mouth at bedtime. , Disp: , Rfl:  .  escitalopram (LEXAPRO) 20 MG tablet, TAKE 1 TABLET(20 MG) BY MOUTH DAILY (Patient taking differently: Take 20 mg by mouth at bedtime. ), Disp: 30 tablet, Rfl: 5 .  fluticasone (FLONASE) 50 MCG/ACT nasal spray, Place 2 sprays into the nose as needed for allergies. , Disp: , Rfl:  .  furosemide (LASIX) 40 MG tablet, Take by mouth., Disp: , Rfl:  .  hydrochlorothiazide (HYDRODIURIL) 25 MG tablet, Take 25 mg by mouth daily., Disp: , Rfl:  .  lansoprazole (PREVACID) 30 MG capsule, Take 1 capsule (30 mg total) by mouth daily at 12 noon. (Patient taking differently: Take 30 mg by mouth as needed (Acid reflux). ), Disp: 30 capsule, Rfl: 5 .  metoprolol tartrate (LOPRESSOR) 25 MG tablet, Take 0.5 tablets (12.5 mg total) by mouth 2 (two) times daily., Disp: 30 tablet, Rfl: 5 .  metoprolol tartrate (LOPRESSOR) 25 MG tablet, Take by mouth., Disp: , Rfl:  .  Potassium Chloride 40 MEQ/15ML (20%) SOLN, Take by mouth., Disp: , Rfl:  .  warfarin (COUMADIN) 2 MG tablet, Take by mouth., Disp: , Rfl:  No current facility-administered medications for this visit.  Facility-Administered Medications Ordered in Other Visits:  .  sodium chloride flush (NS) 0.9 % injection 3 mL, 3 mL, Intravenous, Q12H, Fath, Javier Docker, MD  Past Medical History: Past Medical History:  Diagnosis Date  . Allergic rhinitis   . Anginal pain ([place])   . Anxiety    . Aortic valve stenosis   . Benign neoplasm of skin   . Dyspnea   . GERD (gastroesophageal reflux disease)   . Heart murmur   . Obesity   . Polydipsia   . Sleep apnea    uses CPAP at night    Tobacco Use: Social History   Tobacco Use  Smoking Status Never Smoker  Smokeless Tobacco Never Used    Labs: Recent Review Flowsheet Data    Labs for ITP Cardiac and Pulmonary Rehab Latest Ref Rng & Units 03/16/2014 08/28/2014 11/18/2015 12/12/2017   Cholestrol 100 - 199 mg/dL 201(A) - 191 223(H)   LDLCALC 0 - 99 mg/dL 125 - 122(H) 149(H)   HDL >39 mg/dL 34(A) - 35(L) 43   Trlycerides 0 - 149 mg/dL 209(A) - 171(H) 155(H)   Hemoglobin A1c 4.0 - 6.0 % - 5.7 - -       Exercise Target Goals: Exercise Program Goal: Individual exercise prescription set using results from initial 6 min walk test and THRR while considering  patient's activity barriers and safety.   Exercise Prescription Goal: Initial exercise prescription builds to 30-45 minutes a day of aerobic activity, 2-3 days per week.  Home exercise guidelines will be given to patient during program as part of exercise prescription that the participant will acknowledge.  Activity Barriers & Risk Stratification: Activity Barriers & Cardiac Risk Stratification - 07/17/19 1623  Activity Barriers & Cardiac Risk Stratification   Activity Barriers  Deconditioning;Muscular Weakness;Other (comment)    Comments  incision pulls but not painful    Cardiac Risk Stratification  Low       6 Minute Walk: 6 Minute Walk    Row Name 07/17/19 1622         6 Minute Walk   Phase  Initial     Distance  1195 feet     Walk Time  6 minutes     # of Rest Breaks  0     MPH  2.26     METS  3.29     RPE  13     Perceived Dyspnea   1     VO2 Peak  11.54     Symptoms  Yes (comment)     Comments  SOB     Resting HR  87 bpm     Resting BP  114/74     Resting Oxygen Saturation   96 %     Exercise Oxygen Saturation  during 6 min walk  96 %      Max Ex. HR  113 bpm     Max Ex. BP  174/76     2 Minute Post BP  136/66        Oxygen Initial Assessment:   Oxygen Re-Evaluation:   Oxygen Discharge (Final Oxygen Re-Evaluation):   Initial Exercise Prescription: Initial Exercise Prescription - 07/17/19 1600      Date of Initial Exercise RX and Referring Provider   Date  07/17/19    Referring Provider  Bartholome Bill MD      Treadmill   MPH  2.1    Grade  0.5    Minutes  15    METs  2.75      NuStep   Level  3    SPM  80    Minutes  15    METs  2.7      Elliptical   Level  1    Speed  3    Minutes  15      REL-XR   Level  2    Speed  50    Minutes  15    METs  2.7      T5 Nustep   Level  3    SPM  80    Minutes  15    METs  2.7      Prescription Details   Frequency (times per week)  1    Duration  Progress to 30 minutes of continuous aerobic without signs/symptoms of physical distress      Intensity   THRR 40-80% of Max Heartrate  121-154    Ratings of Perceived Exertion  11-13    Perceived Dyspnea  0-4      Progression   Progression  Continue to progress workloads to maintain intensity without signs/symptoms of physical distress.      Resistance Training   Training Prescription  Yes    Weight  3 lb    Reps  10-15       Perform Capillary Blood Glucose checks as needed.  Exercise Prescription Changes: Exercise Prescription Changes    Row Name 07/17/19 1600 08/05/19 1300 08/20/19 1300 09/03/19 1500       Response to Exercise   Blood Pressure (Admit)  114/74  122/72  124/68  110/60    Blood Pressure (Exercise)  174/76  160/80  126/64  --  Blood Pressure (Exit)  136/66  138/88  124/70  136/70    Heart Rate (Admit)  87 bpm  93 bpm  98 bpm  96 bpm    Heart Rate (Exercise)  113 bpm  136 bpm  111 bpm  120 bpm    Heart Rate (Exit)  95 bpm  102 bpm  94 bpm  103 bpm    Oxygen Saturation (Admit)  96 %  --  --  --    Oxygen Saturation (Exercise)  96 %  --  --  --    Rating of Perceived Exertion  (Exercise)  '13  15  13  15    '$ Perceived Dyspnea (Exercise)  1  --  --  --    Symptoms  SOB  --  --  --    Comments  walk test results  --  --  --    Duration  --  Continue with 30 min of aerobic exercise without signs/symptoms of physical distress.  Continue with 30 min of aerobic exercise without signs/symptoms of physical distress.  Continue with 30 min of aerobic exercise without signs/symptoms of physical distress.    Intensity  --  THRR unchanged  THRR unchanged  THRR unchanged      Progression   Progression  --  Continue to progress workloads to maintain intensity without signs/symptoms of physical distress.  Continue to progress workloads to maintain intensity without signs/symptoms of physical distress.  Continue to progress workloads to maintain intensity without signs/symptoms of physical distress.    Average METs  --  3.4  4.08  3.26      Resistance Training   Training Prescription  --  Yes  Yes  Yes    Weight  --  5 lb  5 lb  5 lb    Reps  --  10-15  10-15  10-15      Interval Training   Interval Training  --  --  No  No      Treadmill   MPH  --  2.1  2.5  2.5    Grade  --  0.'5  1  1    '$ Minutes  --  '15  15  15    '$ METs  --  2.75  3.26  3.26      Elliptical   Level  --  1  --  1    Speed  --  3  --  3    Minutes  --  15  --  15      REL-XR   Level  --  --  4  --    Minutes  --  --  15  --    METs  --  --  4.9  --       Exercise Comments:   Exercise Goals and Review: Exercise Goals    Row Name 07/17/19 1625             Exercise Goals   Increase Physical Activity  Yes       Intervention  Provide advice, education, support and counseling about physical activity/exercise needs.;Develop an individualized exercise prescription for aerobic and resistive training based on initial evaluation findings, risk stratification, comorbidities and participant's personal goals.       Expected Outcomes  Short Term: Attend rehab on a regular basis to increase amount of  physical activity.;Long Term: Add in home exercise to make exercise part of routine and to increase amount of physical activity.;Long  Term: Exercising regularly at least 3-5 days a week.       Increase Strength and Stamina  Yes       Intervention  Provide advice, education, support and counseling about physical activity/exercise needs.;Develop an individualized exercise prescription for aerobic and resistive training based on initial evaluation findings, risk stratification, comorbidities and participant's personal goals.       Expected Outcomes  Short Term: Increase workloads from initial exercise prescription for resistance, speed, and METs.;Short Term: Perform resistance training exercises routinely during rehab and add in resistance training at home;Long Term: Improve cardiorespiratory fitness, muscular endurance and strength as measured by increased METs and functional capacity (6MWT)       Able to understand and use rate of perceived exertion (RPE) scale  Yes       Intervention  Provide education and explanation on how to use RPE scale       Expected Outcomes  Short Term: Able to use RPE daily in rehab to express subjective intensity level;Long Term:  Able to use RPE to guide intensity level when exercising independently       Able to understand and use Dyspnea scale  Yes       Intervention  Provide education and explanation on how to use Dyspnea scale       Expected Outcomes  Short Term: Able to use Dyspnea scale daily in rehab to express subjective sense of shortness of breath during exertion;Long Term: Able to use Dyspnea scale to guide intensity level when exercising independently       Knowledge and understanding of Target Heart Rate Range (THRR)  Yes       Intervention  Provide education and explanation of THRR including how the numbers were predicted and where they are located for reference       Expected Outcomes  Short Term: Able to state/look up THRR;Short Term: Able to use daily as  guideline for intensity in rehab;Long Term: Able to use THRR to govern intensity when exercising independently       Able to check pulse independently  Yes       Intervention  Provide education and demonstration on how to check pulse in carotid and radial arteries.;Review the importance of being able to check your own pulse for safety during independent exercise       Expected Outcomes  Short Term: Able to explain why pulse checking is important during independent exercise;Long Term: Able to check pulse independently and accurately       Understanding of Exercise Prescription  Yes       Intervention  Provide education, explanation, and written materials on patient's individual exercise prescription       Expected Outcomes  Short Term: Able to explain program exercise prescription;Long Term: Able to explain home exercise prescription to exercise independently          Exercise Goals Re-Evaluation : Exercise Goals Re-Evaluation    Row Name 07/24/19 1351 08/04/19 1342 08/05/19 1304 08/20/19 1326 08/28/19 1605     Exercise Goal Re-Evaluation   Exercise Goals Review  Increase Physical Activity;Able to understand and use rate of perceived exertion (RPE) scale;Knowledge and understanding of Target Heart Rate Range (THRR);Understanding of Exercise Prescription;Increase Strength and Stamina;Able to check pulse independently  Increase Physical Activity;Increase Strength and Stamina;Able to understand and use rate of perceived exertion (RPE) scale;Able to understand and use Dyspnea scale;Knowledge and understanding of Target Heart Rate Range (THRR);Able to check pulse independently;Understanding of Exercise Prescription  Increase Physical Activity;Increase  Strength and Stamina;Able to understand and use rate of perceived exertion (RPE) scale;Able to understand and use Dyspnea scale;Knowledge and understanding of Target Heart Rate Range (THRR);Able to check pulse independently;Understanding of Exercise  Prescription  Increase Physical Activity;Increase Strength and Stamina;Understanding of Exercise Prescription  Increase Physical Activity;Increase Strength and Stamina;Understanding of Exercise Prescription   Comments  Reviewed RPE scale, THR and program prescription with pt today.  Pt voiced understanding and was given a copy of goals to take home.  Reviewed home exercise with pt today.  Pt plans to walk, do [employer] and possibly [employer] for exercise.  Reviewed THR, pulse, RPE, sign and symptoms, NTG use, and when to call 911 or MD.  Also discussed weather considerations and indoor options.  Pt voiced understanding.  --  Kately is transitioning back to full time and has her kids back in school.  She is doing well in rehab.  She is up to level 4 on the XR.  We will continue to monitor her progress.  Reneisha is feeling that she got back to work full time too fast.  She is very tired at the end of the week.  She is doing some walking on her off days.  With a shift in scheduling again, [place] may only be able to come on Wednesdays for this month.  Once things settle down again, she hopes to get back to it.  She promises to keep up with his exercise.   Expected Outcomes  Short: Use RPE daily to regulate intensity. Long: Follow program prescription in THR.  Short - exercise 30-5 days per week consistently Long : improve overall stamina  --  Short: Continue to make time to exercise in her day.  Long: Continue to improve stamina.  Short: Continue to exercise when not able to come to class.  Long: Conitnue to improve stamina.   [place] Name 09/03/19 1548             Exercise Goal Re-Evaluation   Exercise Goals Review  Increase Physical Activity;Increase Strength and Stamina;Able to understand and use rate of perceived exertion (RPE) scale;Able to understand and use Dyspnea scale;Knowledge and understanding of Target Heart Rate Range (THRR);Able to check pulse independently;Understanding of Exercise Prescription        Comments  Maricruz is considering joining [employer] and likes [employer] for exercise at home.       Expected Outcomes  Short : exercise consistently at home if she cant attend HT Long:  improve overall stamina          Discharge Exercise Prescription (Final Exercise Prescription Changes): Exercise Prescription Changes - 09/03/19 1500      Response to Exercise   Blood Pressure (Admit)  110/60    Blood Pressure (Exit)  136/70    Heart Rate (Admit)  96 bpm    Heart Rate (Exercise)  120 bpm    Heart Rate (Exit)  103 bpm    Rating of Perceived Exertion (Exercise)  15    Duration  Continue with 30 min of aerobic exercise without signs/symptoms of physical distress.    Intensity  THRR unchanged      Progression   Progression  Continue to progress workloads to maintain intensity without signs/symptoms of physical distress.    Average METs  3.26      Resistance Training   Training Prescription  Yes    Weight  5 lb    Reps  10-15      Interval Training  Interval Training  No      Treadmill   MPH  2.5    Grade  1    Minutes  15    METs  3.26      Elliptical   Level  1    Speed  3    Minutes  15       Nutrition:  Target Goals: Understanding of nutrition guidelines, daily intake of sodium '1500mg'$ , cholesterol '200mg'$ , calories 30% from fat and 7% or less from saturated fats, daily to have 5 or more servings of fruits and vegetables.  Biometrics: Pre Biometrics - 07/17/19 1625      Pre Biometrics   Height  5' 5.5" (1.664 m)    Weight  280 lb 9.6 oz (127.3 kg)    BMI (Calculated)  45.97    Single Leg Stand  30 seconds        Nutrition Therapy Plan and Nutrition Goals: Nutrition Therapy & Goals - 08/11/19 1502      Nutrition Therapy   Drug/Food Interactions  Coumadin/Vit K      Personal Nutrition Goals   Nutrition Goal  ST: add more satiating snacks LT: get into good routine    Comments  B: Slimfast 2 cups coffee (7am) MS: sargento balance breaks L:  sandwich, chips, and carrots (wheat) - PBJ or [place] or soup (microwavable ones) or salad MS: chocolate or skinny pop popcorn D: varies pt states that her downfall is CHOs S: chocolate, wine, popcorn. Pt reports drinking water, diet cran-cherry 1 glass with dinner. Pt reports "stress eating" on some days. Discussed HH eating and balanced eating.      Intervention Plan   Intervention  Prescribe, educate and counsel regarding individualized specific dietary modifications aiming towards targeted core components such as weight, hypertension, lipid management, diabetes, heart failure and other comorbidities.;Nutrition handout(s) given to patient.    Expected Outcomes  Short Term Goal: Understand basic principles of dietary content, such as calories, fat, sodium, cholesterol and nutrients.;Short Term Goal: A plan has been developed with personal nutrition goals set during dietitian appointment.;Long Term Goal: Adherence to prescribed nutrition plan.       Nutrition Assessments: Nutrition Assessments - 07/18/19 0747      MEDFICTS Scores   Pre Score  66       Nutrition Goals Re-Evaluation: Nutrition Goals Re-Evaluation    Row Name 08/28/19 1601             Goals   Nutrition Goal  ST: add more satiating snacks LT: get into good routine       Comment  Pt reports doing well with eating saitiating snacks. she has packed balanced snacks that she eating during the day. Pt reports still feeling ravenous by dinner time but will eat healthier dinners. lots of grilled chicken, steak stirfry.       Expected Outcome  ST: add more satiating snacks LT: get into good routine          Nutrition Goals Discharge (Final Nutrition Goals Re-Evaluation): Nutrition Goals Re-Evaluation - 08/28/19 1601      Goals   Nutrition Goal  ST: add more satiating snacks LT: get into good routine    Comment  Pt reports doing well with eating saitiating snacks. she has packed balanced snacks that she eating during the day.  Pt reports still feeling ravenous by dinner time but will eat healthier dinners. lots of grilled chicken, steak stirfry.    Expected Outcome  ST: add more satiating  snacks LT: get into good routine       Psychosocial: Target Goals: Acknowledge presence or absence of significant depression and/or stress, maximize coping skills, provide positive support system. Participant is able to verbalize types and ability to use techniques and skills needed for reducing stress and depression.   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  Yes   husband, mom, best friends, kid (freshman in college and 8th grader)   Comments  family very loving      Barriers   Psychosocial barriers to participate in program  The patient should benefit from training in stress management and relaxation.;Psychosocial barriers identified (see note)      Screening Interventions   Interventions  Encouraged to exercise;Provide feedback about the scores to participant;To provide support and resources with identified psychosocial needs    Expected Outcomes  Short Term goal: Utilizing psychosocial counselor, staff and physician to assist with identification of specific Stressors or current issues interfering with healing process. Setting desired goal for each stressor or current issue identified.;Long Term Goal: Stressors or current issues are controlled or eliminated.;Short Term goal: Identification and review with participant of any Quality of Life or Depression concerns found by scoring the  questionnaire.;Long Term goal: The participant improves quality of Life and PHQ9 Scores as seen by post scores and/or verbalization of changes       Quality of Life Scores:  Quality of Life - 07/18/19 0742      Quality of Life   Select  Quality of Life      Quality of Life Scores   Health/Function Pre  21.83 %    Socioeconomic Pre  27.57 %    Psych/Spiritual Pre  21.79 %    Family Pre  28.8 %    GLOBAL Pre  24.03 %      Scores of 19 and below usually indicate a poorer quality of life in these areas.  A difference of  2-3 points is a clinically meaningful difference.  A difference of 2-3 points in the total score of the Quality of Life Index has been associated with significant improvement in overall quality of life, self-image, physical symptoms, and general health in studies assessing change in quality of life.  PHQ-9: Recent Review Flowsheet Data    Depression screen [facility] 2/9 07/18/2019 01/06/2019 12/06/2017 08/15/2016   Decreased Interest 0 0 1 1   Down, Depressed, Hopeless 0 1 0 1   PHQ - 2 Score 0 '1 1 2   '$ Altered sleeping '3 1 3 1   '$ Tired, decreased energy 1 0 2 2   Change in appetite 0 0 3 0   Feeling bad or failure about yourself  0 0 0 2   Trouble concentrating 0 0 1 1   Moving slowly or fidgety/restless 0 0 0 0   Suicidal thoughts 0 0 0 0   PHQ-9 Score '4 2 10 8   '$ Difficult doing work/chores Not difficult at all Somewhat difficult Somewhat difficult -     Interpretation of Total Score  Total Score  Depression Severity:  1-4 = Minimal depression, 5-9 = Mild depression, 10-14 = Moderate depression, 15-19 = Moderately severe depression, 20-27 = Severe depression   Psychosocial Evaluation and Intervention: Psychosocial Evaluation - 07/16/19 1103      Psychosocial Evaluation & Interventions   Interventions  Stress management education;Encouraged to exercise with the program and follow exercise prescription    Comments  Dorsey is coming into cardiac rehab after a valve  replacement.  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  She is eager to return to school part time soon and hopefully back in person in March. She misses her kids and seeing them in person.  She has a great support system and loving family that lifts her up daily.  She has a history of anxiety but is well managed on her lexapro currently.  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  She wants to lose weight and get into a good routine for exercise and diet again.  She also wants to rebuild her stamina to be able to make it through a day.    Expected Outcomes  Short: Attend rehab regularly to build stamina.  Long: Continue to stay positive and learn new lifestyle to maintain health.    Continue Psychosocial Services   Follow up required by staff       Psychosocial Re-Evaluation: Psychosocial Re-Evaluation    [place] Name 08/04/19 1336 08/28/19 1609           Psychosocial Re-Evaluation   Current issues with  Current Stress Concerns  Current Stress Concerns;Current Sleep Concerns      Comments  Janasha has started back to work half days.  She will go back to full days in a couple weeks.  We discussed planning exercise to stay on track.  Hayzlee has returned to work full time.  She is not feeling great and starting to regret her decision as she feels wiped out and her INR levels are not where they should be.  She asked to drop back to once a week for as they just found out that they will be having their kids coming back full time after spring break.  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  She has been losing sleep over this as well.  She is trying her best.  We talked about taking care of her and working one day at a time and that we are here for whatever she needs.      Expected Outcomes  Short  - manage stress going back to work Long - maintain positive attitude  Short: Make it through the rest of this month.  Long: Continue to work through school year and get things calmer again.      Interventions  --  Stress management education;Encouraged to attend Cardiac Rehabilitation for the exercise      Continue Psychosocial Services   --  Follow up required by staff         Psychosocial Discharge (Final Psychosocial Re-Evaluation): Psychosocial Re-Evaluation - 08/28/19 1609      Psychosocial Re-Evaluation   Current issues with  Current Stress Concerns;Current Sleep Concerns    Comments  Nancyann has returned to work full time.  She is not feeling great and starting to regret her decision  as she feels wiped out and her INR levels are not where they should be.  She asked to drop back to once a week for as they just found out that they will be having their kids coming back full time after spring break.  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  She has been losing sleep over this as well.  She is trying her best.  We talked about taking care of her and working one day at a time and that we are here for whatever she needs.    Expected Outcomes  Short: Make it through the rest of this month.  Long: Continue to work through school year and get things calmer again.    Interventions  Stress management education;Encouraged to attend Cardiac Rehabilitation for the exercise    Continue Psychosocial Services   Follow up required by staff       Vocational Rehabilitation: Provide vocational rehab assistance to qualifying candidates.   Vocational Rehab Evaluation & Intervention: Vocational Rehab - 07/16/19 1048      Initial Vocational Rehab Evaluation & Intervention   Assessment shows need for Vocational Rehabilitation  No   hopes to return to to half days in two weeks      Education: Education Goals:  Education classes will be provided on a variety of topics geared toward better understanding of heart health and risk factor modification. Participant will state understanding/return demonstration of topics presented as noted by education test scores.  Learning Barriers/Preferences: Learning Barriers/Preferences - 07/16/19 1050      Learning Barriers/Preferences   Learning Barriers  Sight   glasses for distance   Learning Preferences  None       Education Topics:  AED/CPR: - Group verbal and written instruction with the use of models to demonstrate the basic use of the AED with the basic ABC's of resuscitation.   General Nutrition Guidelines/Fats and Fiber: -Group instruction provided by verbal, written material, models and posters to present the general guidelines for heart healthy nutrition. Gives an explanation and review of dietary fats and fiber.   Controlling Sodium/Reading Food Labels: -Group verbal and written material supporting the discussion of sodium use in heart healthy nutrition. Review and explanation with models, verbal and written materials for utilization of the food label.   Exercise Physiology & General Exercise Guidelines: - Group verbal and written instruction with models to review the exercise physiology of the cardiovascular system and associated critical values. Provides general exercise guidelines with specific guidelines to those with heart or lung disease.    Aerobic Exercise & Resistance Training: - Gives group verbal and written instruction on the various components of exercise. Focuses on aerobic and resistive training programs and the benefits of this training and how to safely progress through these programs..   Flexibility, Balance, Mind/Body Relaxation: Provides group verbal/written instruction on the benefits of flexibility and balance training, including mind/body exercise modes such as yoga, pilates and tai chi.  Demonstration and skill  practice provided.   Stress and Anxiety: - Provides group verbal and written instruction about the health risks of elevated stress and causes of high stress.  Discuss the correlation between heart/lung disease and anxiety and treatment options. Review healthy ways to manage with stress and anxiety.   Depression: - Provides group verbal and written instruction on the correlation between heart/lung disease and depressed mood, treatment options, and the stigmas  associated with seeking treatment.   Anatomy & Physiology of the Heart: - Group verbal and written instruction and models provide basic cardiac anatomy and physiology, with the coronary electrical and arterial systems. Review of Valvular disease and Heart Failure   Cardiac Procedures: - Group verbal and written instruction to review commonly prescribed medications for heart disease. Reviews the medication, class of the drug, and side effects. Includes the steps to properly store meds and maintain the prescription regimen. (beta blockers and nitrates)   Cardiac Medications I: - Group verbal and written instruction to review commonly prescribed medications for heart disease. Reviews the medication, class of the drug, and side effects. Includes the steps to properly store meds and maintain the prescription regimen.   Cardiac Medications II: -Group verbal and written instruction to review commonly prescribed medications for heart disease. Reviews the medication, class of the drug, and side effects. (all other drug classes)    Go Sex-Intimacy & Heart Disease, Get SMART - Goal Setting: - Group verbal and written instruction through game format to discuss heart disease and the return to sexual intimacy. Provides group verbal and written material to discuss and apply goal setting through the application of the S.M.A.R.T. Method.   Other Matters of the Heart: - Provides group verbal, written materials and models to describe Stable Angina  and Peripheral Artery. Includes description of the disease process and treatment options available to the cardiac patient.   Exercise & Equipment Safety: - Individual verbal instruction and demonstration of equipment use and safety with use of the equipment.   Cardiac Rehab from 07/17/2019 in [facility] Cardiac and Pulmonary Rehab  Date  07/17/19  Educator  [facility]  Instruction Review Code  1- Verbalizes Understanding      Infection Prevention: - Provides verbal and written material to individual with discussion of infection control including proper hand washing and proper equipment cleaning during exercise session.   Cardiac Rehab from 07/17/2019 in [facility] Cardiac and Pulmonary Rehab  Date  07/17/19  Educator  [facility]  Instruction Review Code  1- Verbalizes Understanding      Falls Prevention: - Provides verbal and written material to individual with discussion of falls prevention and safety.   Cardiac Rehab from 07/17/2019 in [facility] Cardiac and Pulmonary Rehab  Date  07/17/19  Educator  [facility]  Instruction Review Code  1- Verbalizes Understanding      Diabetes: - Individual verbal and written instruction to review signs/symptoms of diabetes, desired ranges of glucose level fasting, after meals and with exercise. Acknowledge that pre and post exercise glucose checks will be done for 3 sessions at entry of program.   Know Your Numbers and Risk Factors: -Group verbal and written instruction about important numbers in your health.  Discussion of what are risk factors and how they play a role in the disease process.  Review of Cholesterol, Blood Pressure, Diabetes, and BMI and the role they play in your overall health.   Sleep Hygiene: -Provides group verbal and written instruction about how sleep can affect your health.  Define sleep hygiene, discuss sleep cycles and impact of sleep habits. Review good sleep hygiene tips.    Other: -Provides group and verbal instruction on various topics (see  comments)   Knowledge Questionnaire Score: Knowledge Questionnaire Score - 07/18/19 0747      Knowledge Questionnaire Score   Pre Score  24/26 Education Focus: PAD, [place]       Core Components/Risk Factors/Patient Goals at Admission: Personal Goals and Risk Factors at  Admission - 07/18/19 0748      Core Components/Risk Factors/Patient Goals on Admission    Weight Management  Yes;Obesity;Weight Loss    Intervention  Weight Management: Develop a combined nutrition and exercise program designed to reach desired caloric intake, while maintaining appropriate intake of nutrient and fiber, sodium and fats, and appropriate energy expenditure required for the weight goal.;Weight Management: Provide education and appropriate resources to help participant work on and attain dietary goals.;Weight Management/Obesity: Establish reasonable short term and long term weight goals.;Obesity: Provide education and appropriate resources to help participant work on and attain dietary goals.    Admit Weight  280 lb 9.6 oz (127.3 kg)    Goal Weight: Short Term  275 lb (124.7 kg)    Goal Weight: Long Term  250 lb (113.4 kg)    Expected Outcomes  Short Term: Continue to assess and modify interventions until short term weight is achieved;Long Term: Adherence to nutrition and physical activity/exercise program aimed toward attainment of established weight goal;Weight Loss: Understanding of general recommendations for a balanced deficit meal plan, which promotes 1-2 lb weight loss per week and includes a negative energy balance of [id number] kcal/d;Understanding recommendations for meals to include 15-35% energy as protein, 25-35% energy from fat, 35-60% energy from carbohydrates, less than '200mg'$  of dietary cholesterol, 20-35 gm of total fiber daily;Understanding of distribution of calorie intake throughout the day with the consumption of 4-5 meals/snacks    Lipids  Yes    Intervention  Provide education and support for  participant on nutrition & aerobic/resistive exercise along with prescribed medications to achieve LDL '70mg'$ , HDL >'40mg'$ .    Expected Outcomes  Short Term: Participant states understanding of desired cholesterol values and is compliant with medications prescribed. Participant is following exercise prescription and nutrition guidelines.;Long Term: Cholesterol controlled with medications as prescribed, with individualized exercise RX and with personalized nutrition plan. Value goals: LDL < '70mg'$ , HDL > 40 mg.       Core Components/Risk Factors/Patient Goals Review:  Goals and Risk Factor Review    Row Name 08/04/19 1329 08/28/19 1554           Core Components/Risk Factors/Patient Goals Review   Personal Goals Review  Weight Management/Obesity;Improve shortness of breath with ADL's  Weight Management/Obesity;Improve shortness of breath with ADL's      Review  Adylee is taking meds as directed.  She states she has more energy in general and sleeps better.  She had some insomnia after surgery.  She is enjoying getting back to exercise  Sanaia is taking meds as directed.  She states she has lower energy in general believes it is her INR, she has been sleeping well.  She is enjoying getting back to exercise, feels that it is helping her stress level. Believes her fitness level is increasing can now do ADLs more easily.  Doing a little at a time at home like yoga or walking.      Expected Outcomes  Short - exercise consistently on days not at [facility] - improve overall stamina  Short - exercise consistently on days not at [facility] - improve overall stamina         Core Components/Risk Factors/Patient Goals at Discharge (Final Review):  Goals and Risk Factor Review - 08/28/19 1554      Core Components/Risk Factors/Patient Goals Review   Personal Goals Review  Weight Management/Obesity;Improve shortness of breath with ADL's    Review  Verley is taking meds as directed.  She states she  has lower energy in general  believes it is her INR, she has been sleeping well.  She is enjoying getting back to exercise, feels that it is helping her stress level. Believes her fitness level is increasing can now do ADLs more easily.  Doing a little at a time at home like yoga or walking.    Expected Outcomes  Short - exercise consistently on days not at [facility] - improve overall stamina       ITP Comments: ITP Comments    Row Name 07/16/19 1110 07/17/19 1622 07/24/19 1350 08/11/19 1534 08/13/19 0650   ITP Comments  Completed virtual orientation today.  EP eval scheduled for 1/29 at 1330. Documentation for diagnosis can be found in Care Everywhere encounter 06/04/19.  Completed 6MWT and gym orientation.  Initial ITP created and sent for review to Dr. Emily Filbert, Medical Director.  First full day of exercise!  Patient was oriented to gym and equipment including functions, settings, policies, and procedures.  Patient's individual exercise prescription and treatment plan were reviewed.  All starting workloads were established based on the results of the 6 minute walk test done at initial orientation visit.  The plan for exercise progression was also introduced and progression will be customized based on patient's performance and goals.  Completed Initial RD Eval  30 day chart review completed. ITP sent to Dr Zachery Dakins Medical Director, for review,changes as needed and signature.   [place] Name 09/10/19 0632           ITP Comments  30 day chart review completed. ITP sent to Dr Zachery Dakins Medical Director, for review,changes as needed and signature. Continue with ITP if no changes requested          Comments:

## 2019-09-16 ENCOUNTER — Encounter: Payer: Self-pay | Admitting: *Deleted

## 2019-09-16 DIAGNOSIS — Z952 Presence of prosthetic heart valve: Secondary | ICD-10-CM

## 2019-09-20 ENCOUNTER — Ambulatory Visit: Payer: 59 | Attending: Internal Medicine

## 2019-09-20 DIAGNOSIS — Z23 Encounter for immunization: Secondary | ICD-10-CM

## 2019-09-20 NOTE — Progress Notes (Signed)
   Covid-19 Vaccination Clinic  Name:  Natasha Boyd    MRN: [id number] DOB: December 02, 1970  09/20/2019  [name] was observed post Covid-19 immunization for 30 minutes based on pre-vaccination screening without incident. She was provided with Vaccine Information Sheet and instruction to access the V-Safe system.   [name] was instructed to call 911 with any severe reactions post vaccine: Marland Kitchen Difficulty breathing  . Swelling of face and throat  . A fast heartbeat  . A bad rash all over body  . Dizziness and weakness   Immunizations Administered    Name Date Dose VIS Date Route   Moderna COVID-19 Vaccine 09/20/2019 11:46 AM 0.5 mL 05/20/2019 Intramuscular   Manufacturer: Levan Hurst   [id number]   [id number]

## 2019-10-01 ENCOUNTER — Encounter: Payer: Self-pay | Admitting: *Deleted

## 2019-10-01 DIAGNOSIS — Z952 Presence of prosthetic heart valve: Secondary | ICD-10-CM

## 2019-10-01 NOTE — Progress Notes (Signed)
Cardiac Individual Treatment Plan  Patient Details  Name: Natasha Boyd MRN: [id number] Date of Birth: 03-13-1971 Referring Provider:     Cardiac Rehab from 07/17/2019 in [facility] Cardiac and Pulmonary Rehab  Referring Provider  Bartholome Bill MD      Initial Encounter Date:    Cardiac Rehab from 07/17/2019 in [facility] Cardiac and Pulmonary Rehab  Date  07/17/19      Visit Diagnosis: S/P aortic valve replacement  Patient's Home Medications on Admission:  Current Outpatient Medications:  .  amLODipine (NORVASC) 5 MG tablet, Take 5 mg by mouth at bedtime. , Disp: , Rfl:  .  escitalopram (LEXAPRO) 20 MG tablet, TAKE 1 TABLET(20 MG) BY MOUTH DAILY (Patient taking differently: Take 20 mg by mouth at bedtime. ), Disp: 30 tablet, Rfl: 5 .  fluticasone (FLONASE) 50 MCG/ACT nasal spray, Place 2 sprays into the nose as needed for allergies. , Disp: , Rfl:  .  furosemide (LASIX) 40 MG tablet, Take by mouth., Disp: , Rfl:  .  hydrochlorothiazide (HYDRODIURIL) 25 MG tablet, Take 25 mg by mouth daily., Disp: , Rfl:  .  lansoprazole (PREVACID) 30 MG capsule, Take 1 capsule (30 mg total) by mouth daily at 12 noon. (Patient taking differently: Take 30 mg by mouth as needed (Acid reflux). ), Disp: 30 capsule, Rfl: 5 .  metoprolol tartrate (LOPRESSOR) 25 MG tablet, Take 0.5 tablets (12.5 mg total) by mouth 2 (two) times daily., Disp: 30 tablet, Rfl: 5 .  metoprolol tartrate (LOPRESSOR) 25 MG tablet, Take by mouth., Disp: , Rfl:  .  Potassium Chloride 40 MEQ/15ML (20%) SOLN, Take by mouth., Disp: , Rfl:  .  warfarin (COUMADIN) 2 MG tablet, Take by mouth., Disp: , Rfl:  No current facility-administered medications for this visit.  Facility-Administered Medications Ordered in Other Visits:  .  sodium chloride flush (NS) 0.9 % injection 3 mL, 3 mL, Intravenous, Q12H, Fath, Javier Docker, MD  Past Medical History: Past Medical History:  Diagnosis Date  . Allergic rhinitis   . Anginal pain ([place])   . Anxiety    . Aortic valve stenosis   . Benign neoplasm of skin   . Dyspnea   . GERD (gastroesophageal reflux disease)   . Heart murmur   . Obesity   . Polydipsia   . Sleep apnea    uses CPAP at night    Tobacco Use: Social History   Tobacco Use  Smoking Status Never Smoker  Smokeless Tobacco Never Used    Labs: Recent Review Flowsheet Data    Labs for ITP Cardiac and Pulmonary Rehab Latest Ref Rng & Units 03/16/2014 08/28/2014 11/18/2015 12/12/2017   Cholestrol 100 - 199 mg/dL 201(A) - 191 223(H)   LDLCALC 0 - 99 mg/dL 125 - 122(H) 149(H)   HDL >39 mg/dL 34(A) - 35(L) 43   Trlycerides 0 - 149 mg/dL 209(A) - 171(H) 155(H)   Hemoglobin A1c 4.0 - 6.0 % - 5.7 - -       Exercise Target Goals: Exercise Program Goal: Individual exercise prescription set using results from initial 6 min walk test and THRR while considering  patient's activity barriers and safety.   Exercise Prescription Goal: Initial exercise prescription builds to 30-45 minutes a day of aerobic activity, 2-3 days per week.  Home exercise guidelines will be given to patient during program as part of exercise prescription that the participant will acknowledge.   Education: Aerobic Exercise & Resistance Training: - Gives group verbal and written instruction on the  various components of exercise. Focuses on aerobic and resistive training programs and the benefits of this training and how to safely progress through these programs..   Education: Exercise & Equipment Safety: - Individual verbal instruction and demonstration of equipment use and safety with use of the equipment.   Cardiac Rehab from 07/17/2019 in [facility] Cardiac and Pulmonary Rehab  Date  07/17/19  Educator  [facility]  Instruction Review Code  1- Verbalizes Understanding      Education: Exercise Physiology & General Exercise Guidelines: - Group verbal and written instruction with models to review the exercise physiology of the cardiovascular system and associated  critical values. Provides general exercise guidelines with specific guidelines to those with heart or lung disease.    Education: Flexibility, Balance, Mind/Body Relaxation: Provides group verbal/written instruction on the benefits of flexibility and balance training, including mind/body exercise modes such as yoga, pilates and tai chi.  Demonstration and skill practice provided.   Activity Barriers & Risk Stratification: Activity Barriers & Cardiac Risk Stratification - 07/17/19 1623      Activity Barriers & Cardiac Risk Stratification   Activity Barriers  Deconditioning;Muscular Weakness;Other (comment)    Comments  incision pulls but not painful    Cardiac Risk Stratification  Low       6 Minute Walk: 6 Minute Walk    Row Name 07/17/19 1622         6 Minute Walk   Phase  Initial     Distance  1195 feet     Walk Time  6 minutes     # of Rest Breaks  0     MPH  2.26     METS  3.29     RPE  13     Perceived Dyspnea   1     VO2 Peak  11.54     Symptoms  Yes (comment)     Comments  SOB     Resting HR  87 bpm     Resting BP  114/74     Resting Oxygen Saturation   96 %     Exercise Oxygen Saturation  during 6 min walk  96 %     Max Ex. HR  113 bpm     Max Ex. BP  174/76     2 Minute Post BP  136/66        Oxygen Initial Assessment:   Oxygen Re-Evaluation:   Oxygen Discharge (Final Oxygen Re-Evaluation):   Initial Exercise Prescription: Initial Exercise Prescription - 07/17/19 1600      Date of Initial Exercise RX and Referring Provider   Date  07/17/19    Referring Provider  Bartholome Bill MD      Treadmill   MPH  2.1    Grade  0.5    Minutes  15    METs  2.75      NuStep   Level  3    SPM  80    Minutes  15    METs  2.7      Elliptical   Level  1    Speed  3    Minutes  15      REL-XR   Level  2    Speed  50    Minutes  15    METs  2.7      T5 Nustep   Level  3    SPM  80    Minutes  15    METs  2.7  Prescription Details    Frequency (times per week)  1    Duration  Progress to 30 minutes of continuous aerobic without signs/symptoms of physical distress      Intensity   THRR 40-80% of Max Heartrate  121-154    Ratings of Perceived Exertion  11-13    Perceived Dyspnea  0-4      Progression   Progression  Continue to progress workloads to maintain intensity without signs/symptoms of physical distress.      Resistance Training   Training Prescription  Yes    Weight  3 lb    Reps  10-15       Perform Capillary Blood Glucose checks as needed.  Exercise Prescription Changes: Exercise Prescription Changes    Row Name 07/17/19 1600 08/05/19 1300 08/20/19 1300 09/03/19 1500       Response to Exercise   Blood Pressure (Admit)  114/74  122/72  124/68  110/60    Blood Pressure (Exercise)  174/76  160/80  126/64  --    Blood Pressure (Exit)  136/66  138/88  124/70  136/70    Heart Rate (Admit)  87 bpm  93 bpm  98 bpm  96 bpm    Heart Rate (Exercise)  113 bpm  136 bpm  111 bpm  120 bpm    Heart Rate (Exit)  95 bpm  102 bpm  94 bpm  103 bpm    Oxygen Saturation (Admit)  96 %  --  --  --    Oxygen Saturation (Exercise)  96 %  --  --  --    Rating of Perceived Exertion (Exercise)  13  15  13  15     Perceived Dyspnea (Exercise)  1  --  --  --    Symptoms  SOB  --  --  --    Comments  walk test results  --  --  --    Duration  --  Continue with 30 min of aerobic exercise without signs/symptoms of physical distress.  Continue with 30 min of aerobic exercise without signs/symptoms of physical distress.  Continue with 30 min of aerobic exercise without signs/symptoms of physical distress.    Intensity  --  THRR unchanged  THRR unchanged  THRR unchanged      Progression   Progression  --  Continue to progress workloads to maintain intensity without signs/symptoms of physical distress.  Continue to progress workloads to maintain intensity without signs/symptoms of physical distress.  Continue to progress workloads to  maintain intensity without signs/symptoms of physical distress.    Average METs  --  3.4  4.08  3.26      Resistance Training   Training Prescription  --  Yes  Yes  Yes    Weight  --  5 lb  5 lb  5 lb    Reps  --  10-15  10-15  10-15      Interval Training   Interval Training  --  --  No  No      Treadmill   MPH  --  2.1  2.5  2.5    Grade  --  0.5  1  1     Minutes  --  15  15  15     METs  --  2.75  3.26  3.26      Elliptical   Level  --  1  --  1    Speed  --  3  --  3    Minutes  --  15  --  15      REL-XR   Level  --  --  4  --    Minutes  --  --  15  --    METs  --  --  4.9  --       Exercise Comments:   Exercise Goals and Review: Exercise Goals    Row Name 07/17/19 1625             Exercise Goals   Increase Physical Activity  Yes       Intervention  Provide advice, education, support and counseling about physical activity/exercise needs.;Develop an individualized exercise prescription for aerobic and resistive training based on initial evaluation findings, risk stratification, comorbidities and participant's personal goals.       Expected Outcomes  Short Term: Attend rehab on a regular basis to increase amount of physical activity.;Long Term: Add in home exercise to make exercise part of routine and to increase amount of physical activity.;Long Term: Exercising regularly at least 3-5 days a week.       Increase Strength and Stamina  Yes       Intervention  Provide advice, education, support and counseling about physical activity/exercise needs.;Develop an individualized exercise prescription for aerobic and resistive training based on initial evaluation findings, risk stratification, comorbidities and participant's personal goals.       Expected Outcomes  Short Term: Increase workloads from initial exercise prescription for resistance, speed, and METs.;Short Term: Perform resistance training exercises routinely during rehab and add in resistance training at  home;Long Term: Improve cardiorespiratory fitness, muscular endurance and strength as measured by increased METs and functional capacity (6MWT)       Able to understand and use rate of perceived exertion (RPE) scale  Yes       Intervention  Provide education and explanation on how to use RPE scale       Expected Outcomes  Short Term: Able to use RPE daily in rehab to express subjective intensity level;Long Term:  Able to use RPE to guide intensity level when exercising independently       Able to understand and use Dyspnea scale  Yes       Intervention  Provide education and explanation on how to use Dyspnea scale       Expected Outcomes  Short Term: Able to use Dyspnea scale daily in rehab to express subjective sense of shortness of breath during exertion;Long Term: Able to use Dyspnea scale to guide intensity level when exercising independently       Knowledge and understanding of Target Heart Rate Range (THRR)  Yes       Intervention  Provide education and explanation of THRR including how the numbers were predicted and where they are located for reference       Expected Outcomes  Short Term: Able to state/look up THRR;Short Term: Able to use daily as guideline for intensity in rehab;Long Term: Able to use THRR to govern intensity when exercising independently       Able to check pulse independently  Yes       Intervention  Provide education and demonstration on how to check pulse in carotid and radial arteries.;Review the importance of being able to check your own pulse for safety during independent exercise       Expected Outcomes  Short Term: Able to explain why pulse checking is important during independent exercise;Long Term: Able to check pulse  independently and accurately       Understanding of Exercise Prescription  Yes       Intervention  Provide education, explanation, and written materials on patient's individual exercise prescription       Expected Outcomes  Short Term: Able to explain  program exercise prescription;Long Term: Able to explain home exercise prescription to exercise independently          Exercise Goals Re-Evaluation : Exercise Goals Re-Evaluation    Row Name 07/24/19 1351 08/04/19 1342 08/05/19 1304 08/20/19 1326 08/28/19 1605     Exercise Goal Re-Evaluation   Exercise Goals Review  Increase Physical Activity;Able to understand and use rate of perceived exertion (RPE) scale;Knowledge and understanding of Target Heart Rate Range (THRR);Understanding of Exercise Prescription;Increase Strength and Stamina;Able to check pulse independently  Increase Physical Activity;Increase Strength and Stamina;Able to understand and use rate of perceived exertion (RPE) scale;Able to understand and use Dyspnea scale;Knowledge and understanding of Target Heart Rate Range (THRR);Able to check pulse independently;Understanding of Exercise Prescription  Increase Physical Activity;Increase Strength and Stamina;Able to understand and use rate of perceived exertion (RPE) scale;Able to understand and use Dyspnea scale;Knowledge and understanding of Target Heart Rate Range (THRR);Able to check pulse independently;Understanding of Exercise Prescription  Increase Physical Activity;Increase Strength and Stamina;Understanding of Exercise Prescription  Increase Physical Activity;Increase Strength and Stamina;Understanding of Exercise Prescription   Comments  Reviewed RPE scale, THR and program prescription with pt today.  Pt voiced understanding and was given a copy of goals to take home.  Reviewed home exercise with pt today.  Pt plans to walk, do [employer] and possibly [employer] for exercise.  Reviewed THR, pulse, RPE, sign and symptoms, NTG use, and when to call 911 or MD.  Also discussed weather considerations and indoor options.  Pt voiced understanding.  --  Shellby is transitioning back to full time and has her kids back in school.  She is doing well in rehab.  She is up to level 4 on the XR.   We will continue to monitor her progress.  Syrai is feeling that she got back to work full time too fast.  She is very tired at the end of the week.  She is doing some walking on her off days.  With a shift in scheduling again, Miela may only be able to come on Wednesdays for this month.  Once things settle down again, she hopes to get back to it.  She promises to keep up with his exercise.   Expected Outcomes  Short: Use RPE daily to regulate intensity. Long: Follow program prescription in THR.  Short - exercise 30-5 days per week consistently Long : improve overall stamina  --  Short: Continue to make time to exercise in her day.  Long: Continue to improve stamina.  Short: Continue to exercise when not able to come to class.  Long: Conitnue to improve stamina.   [place] Name 09/03/19 1548 09/16/19 1523           Exercise Goal Re-Evaluation   Exercise Goals Review  Increase Physical Activity;Increase Strength and Stamina;Able to understand and use rate of perceived exertion (RPE) scale;Able to understand and use Dyspnea scale;Knowledge and understanding of Target Heart Rate Range (THRR);Able to check pulse independently;Understanding of Exercise Prescription  --      Comments  Cambelle is considering joining [employer] and likes [employer] for exercise at home.  Out since last review, considering class change or virtual program.  Expected Outcomes  Short : exercise consistently at home if she cant attend HT Long:  improve overall stamina  --         Discharge Exercise Prescription (Final Exercise Prescription Changes): Exercise Prescription Changes - 09/03/19 1500      Response to Exercise   Blood Pressure (Admit)  110/60    Blood Pressure (Exit)  136/70    Heart Rate (Admit)  96 bpm    Heart Rate (Exercise)  120 bpm    Heart Rate (Exit)  103 bpm    Rating of Perceived Exertion (Exercise)  15    Duration  Continue with 30 min of aerobic exercise without signs/symptoms of physical  distress.    Intensity  THRR unchanged      Progression   Progression  Continue to progress workloads to maintain intensity without signs/symptoms of physical distress.    Average METs  3.26      Resistance Training   Training Prescription  Yes    Weight  5 lb    Reps  10-15      Interval Training   Interval Training  No      Treadmill   MPH  2.5    Grade  1    Minutes  15    METs  3.26      Elliptical   Level  1    Speed  3    Minutes  15       Nutrition:  Target Goals: Understanding of nutrition guidelines, daily intake of sodium <159m, cholesterol <2052m calories 30% from fat and 7% or less from saturated fats, daily to have 5 or more servings of fruits and vegetables.  Education: Controlling Sodium/Reading Food Labels -Group verbal and written material supporting the discussion of sodium use in heart healthy nutrition. Review and explanation with models, verbal and written materials for utilization of the food label.   Education: General Nutrition Guidelines/Fats and Fiber: -Group instruction provided by verbal, written material, models and posters to present the general guidelines for heart healthy nutrition. Gives an explanation and review of dietary fats and fiber.   Biometrics: Pre Biometrics - 07/17/19 1625      Pre Biometrics   Height  5' 5.5" (1.664 m)    Weight  280 lb 9.6 oz (127.3 kg)    BMI (Calculated)  45.97    Single Leg Stand  30 seconds        Nutrition Therapy Plan and Nutrition Goals: Nutrition Therapy & Goals - 08/11/19 1502      Nutrition Therapy   Drug/Food Interactions  Coumadin/Vit K      Personal Nutrition Goals   Nutrition Goal  ST: add more satiating snacks LT: get into good routine    Comments  B: Slimfast 2 cups coffee (7am) MS: sargento balance breaks L: sandwich, chips, and carrots (wheat) - PBJ or tuKuwaitr soup (microwavable ones) or salad MS: chocolate or skinny pop popcorn D: varies pt states that her downfall is CHOs  S: chocolate, wine, popcorn. Pt reports drinking water, diet cran-cherry 1 glass with dinner. Pt reports "stress eating" on some days. Discussed HH eating and balanced eating.      Intervention Plan   Intervention  Prescribe, educate and counsel regarding individualized specific dietary modifications aiming towards targeted core components such as weight, hypertension, lipid management, diabetes, heart failure and other comorbidities.;Nutrition handout(s) given to patient.    Expected Outcomes  Short Term Goal: Understand basic principles of dietary content,  such as calories, fat, sodium, cholesterol and nutrients.;Short Term Goal: A plan has been developed with personal nutrition goals set during dietitian appointment.;Long Term Goal: Adherence to prescribed nutrition plan.       Nutrition Assessments: Nutrition Assessments - 07/18/19 0747      MEDFICTS Scores   Pre Score  66       MEDIFICTS Score Key:          ?70 Need to make dietary changes          40-70 Heart Healthy Diet         ? 40 Therapeutic Level Cholesterol Diet  Nutrition Goals Re-Evaluation: Nutrition Goals Re-Evaluation    [place] Name 08/28/19 1601             Goals   Nutrition Goal  ST: add more satiating snacks LT: get into good routine       Comment  Pt reports doing well with eating saitiating snacks. she has packed balanced snacks that she eating during the day. Pt reports still feeling ravenous by dinner time but will eat healthier dinners. lots of grilled chicken, steak stirfry.       Expected Outcome  ST: add more satiating snacks LT: get into good routine          Nutrition Goals Discharge (Final Nutrition Goals Re-Evaluation): Nutrition Goals Re-Evaluation - 08/28/19 1601      Goals   Nutrition Goal  ST: add more satiating snacks LT: get into good routine    Comment  Pt reports doing well with eating saitiating snacks. she has packed balanced snacks that she eating during the day. Pt reports still  feeling ravenous by dinner time but will eat healthier dinners. lots of grilled chicken, steak stirfry.    Expected Outcome  ST: add more satiating snacks LT: get into good routine       Psychosocial: Target Goals: Acknowledge presence or absence of significant depression and/or stress, maximize coping skills, provide positive support system. Participant is able to verbalize types and ability to use techniques and skills needed for reducing stress and depression.   Education: Depression - Provides group verbal and written instruction on the correlation between heart/lung disease and depressed mood, treatment options, and the stigmas associated with seeking treatment.   Education: Sleep Hygiene -Provides group verbal and written instruction about how sleep can affect your health.  Define sleep hygiene, discuss sleep cycles and impact of sleep habits. Review good sleep hygiene tips.     Education: Stress and Anxiety: - Provides group verbal and written instruction about the health risks of elevated stress and causes of high stress.  Discuss the correlation between heart/lung disease and anxiety and treatment options. Review healthy ways to manage with stress and anxiety.    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  Yes   husband, mom, best friends, kid (freshman in college and 8th grader)   Comments  family very loving      Barriers   Psychosocial barriers to participate in program  The patient should benefit  from training in stress management and  relaxation.;Psychosocial barriers identified (see note)      Screening Interventions   Interventions  Encouraged to exercise;Provide feedback about the scores to participant;To provide support and resources with identified psychosocial needs    Expected Outcomes  Short Term goal: Utilizing psychosocial counselor, staff and physician to assist with identification of specific Stressors or current issues interfering with healing process. Setting desired goal for each stressor or current issue identified.;Long Term Goal: Stressors or current issues are controlled or eliminated.;Short Term goal: Identification and review with participant of any Quality of Life or Depression concerns found by scoring the questionnaire.;Long Term goal: The participant improves quality of Life and PHQ9 Scores as seen by post scores and/or verbalization of changes       Quality of Life Scores:  Quality of Life - 07/18/19 0742      Quality of Life   Select  Quality of Life      Quality of Life Scores   Health/Function Pre  21.83 %    Socioeconomic Pre  27.57 %    Psych/Spiritual Pre  21.79 %    Family Pre  28.8 %    GLOBAL Pre  24.03 %      Scores of 19 and below usually indicate a poorer quality of life in these areas.  A difference of  2-3 points is a clinically meaningful difference.  A difference of 2-3 points in the total score of the Quality of Life Index has been associated with significant improvement in overall quality of life, self-image, physical symptoms, and general health in studies assessing change in quality of life.  PHQ-9: Recent Review Flowsheet Data    Depression screen [facility] 2/9 07/18/2019 01/06/2019 12/06/2017 08/15/2016   Decreased Interest 0 0 1 1   Down, Depressed, Hopeless 0 1 0 1   PHQ - 2 Score 0 1 1 2    Altered sleeping 3 1 3 1    Tired, decreased energy 1 0 2 2   Change in appetite 0 0 3 0   Feeling bad or failure about yourself  0 0 0 2   Trouble concentrating 0 0 1 1   Moving slowly  or fidgety/restless 0 0 0 0   Suicidal thoughts 0 0 0 0   PHQ-9 Score 4 2 10 8    Difficult doing work/chores Not difficult at all Somewhat difficult Somewhat difficult -     Interpretation of Total Score  Total Score Depression Severity:  1-4 = Minimal depression, 5-9 = Mild depression, 10-14 = Moderate depression, 15-19 = Moderately severe depression, 20-27 = Severe depression   Psychosocial Evaluation and Intervention: Psychosocial Evaluation - 07/16/19 1103      Psychosocial Evaluation & Interventions   Interventions  Stress management education;Encouraged to exercise with the program and follow exercise prescription    Comments  Murrel is coming into cardiac rehab after a valve replacement.  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  She is eager to return to school part time soon and hopefully back in person in March. She misses her kids and seeing them in person.  She has a great support system and loving family that lifts her up daily.  She has a history of anxiety but is well managed on her lexapro currently.  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  She wants  to lose weight and get into a good routine for exercise and diet again.  She also wants to rebuild her stamina to be able to make it through a day.    Expected Outcomes  Short: Attend rehab regularly to build stamina.  Long: Continue to stay positive and learn new lifestyle to maintain health.    Continue Psychosocial Services   Follow up required by staff       Psychosocial Re-Evaluation: Psychosocial Re-Evaluation    [place] Name 08/04/19 1336 08/28/19 1609           Psychosocial Re-Evaluation   Current issues with  Current Stress Concerns  Current Stress Concerns;Current Sleep Concerns      Comments  Briana has started back to work half days.  She will go back to full days in a couple weeks.  We  discussed planning exercise to stay on track.  Maham has returned to work full time.  She is not feeling great and starting to regret her decision as she feels wiped out and her INR levels are not where they should be.  She asked to drop back to once a week for as they just found out that they will be having their kids coming back full time after spring break.  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  She has been losing sleep over this as well.  She is trying her best.  We talked about taking care of her and working one day at a time and that we are here for whatever she needs.      Expected Outcomes  Short - manage stress going back to work Long - maintain positive attitude  Short: Make it through the rest of this month.  Long: Continue to work through school year and get things calmer again.      Interventions  --  Stress management education;Encouraged to attend Cardiac Rehabilitation for the exercise      Continue Psychosocial Services   --  Follow up required by staff         Psychosocial Discharge (Final Psychosocial Re-Evaluation): Psychosocial Re-Evaluation - 08/28/19 1609      Psychosocial Re-Evaluation   Current issues with  Current Stress Concerns;Current Sleep Concerns    Comments  Shawndra has returned to work full time.  She is not feeling great and starting to regret her decision as she feels wiped out and her INR levels are not where they should be.  She asked to drop back to once a week for as they just found out that they will be having their kids coming back full time after spring break.  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  She has been losing sleep over this as well.  She is trying her best.  We talked about taking care of her and working one day at a time and that we are here for whatever she  needs.    Expected Outcomes  Short: Make it through the rest of this month.  Long: Continue to work through school year and get things calmer again.    Interventions  Stress management education;Encouraged to attend Cardiac Rehabilitation for the exercise    Continue Psychosocial Services   Follow up required by staff       Vocational Rehabilitation:  Provide vocational rehab assistance to qualifying candidates.   Vocational Rehab Evaluation & Intervention: Vocational Rehab - 07/16/19 1048      Initial Vocational Rehab Evaluation & Intervention   Assessment shows need for Vocational Rehabilitation  No   hopes to return to to half days in two weeks      Education: Education Goals: Education classes will be provided on a variety of topics geared toward better understanding of heart health and risk factor modification. Participant will state understanding/return demonstration of topics presented as noted by education test scores.  Learning Barriers/Preferences: Learning Barriers/Preferences - 07/16/19 1050      Learning Barriers/Preferences   Learning Barriers  Sight   glasses for distance   Learning Preferences  None       General Cardiac Education Topics:  AED/CPR: - Group verbal and written instruction with the use of models to demonstrate the basic use of the AED with the basic ABC's of resuscitation.   Anatomy & Physiology of the Heart: - Group verbal and written instruction and models provide basic cardiac anatomy and physiology, with the coronary electrical and arterial systems. Review of Valvular disease and Heart Failure   Cardiac Procedures: - Group verbal and written instruction to review commonly prescribed medications for heart disease. Reviews the medication, class of the drug, and side effects. Includes the steps to properly store meds and maintain the prescription regimen. (beta blockers and nitrates)   Cardiac Medications I: - Group verbal and written  instruction to review commonly prescribed medications for heart disease. Reviews the medication, class of the drug, and side effects. Includes the steps to properly store meds and maintain the prescription regimen.   Cardiac Medications II: -Group verbal and written instruction to review commonly prescribed medications for heart disease. Reviews the medication, class of the drug, and side effects. (all other drug classes)    Go Sex-Intimacy & Heart Disease, Get SMART - Goal Setting: - Group verbal and written instruction through game format to discuss heart disease and the return to sexual intimacy. Provides group verbal and written material to discuss and apply goal setting through the application of the S.M.A.R.T. Method.   Other Matters of the Heart: - Provides group verbal, written materials and models to describe Stable Angina and Peripheral Artery. Includes description of the disease process and treatment options available to the cardiac patient.   Infection Prevention: - Provides verbal and written material to individual with discussion of infection control including proper hand washing and proper equipment cleaning during exercise session.   Cardiac Rehab from 07/17/2019 in [facility]  Date  07/17/19  Educator  [facility]  Instruction Review Code  1- Verbalizes Understanding      Falls Prevention: - Provides verbal and written material to individual with discussion of falls prevention and safety.   Cardiac Rehab from 07/17/2019 in [facility] Cardiac [facility] Re[facility]  Date  07/17/19  Educator  [facility]  Instruction Review Code  1- Verbalizes Understanding      Other: -Provides group and verbal instruction on various topics (see comments)   Knowledge Questionnaire Score: Knowledge Questionnaire Score - 07/18/19 0747      Knowledge Questionnaire Score   Pre Score  24/26 Education Focus: PAD, [place]       Core Components/Risk Factors/Patient Goals at  Admission: Personal Goals and Risk Factors at Admission - 07/18/19 0748      Core Components/Risk Factors/Patient Goals on Admission    Weight Management  Yes;Obesity;Weight Loss  Intervention  Weight Management: Develop a combined nutrition and exercise program designed to reach desired caloric intake, while maintaining appropriate intake of nutrient and fiber, sodium and fats, and appropriate energy expenditure required for the weight goal.;Weight Management: Provide education and appropriate resources to help participant work on and attain dietary goals.;Weight Management/Obesity: Establish reasonable short term and long term weight goals.;Obesity: Provide education and appropriate resources to help participant work on and attain dietary goals.    Admit Weight  280 lb 9.6 oz (127.3 kg)    Goal Weight: Short Term  275 lb (124.7 kg)    Goal Weight: Long Term  250 lb (113.4 kg)    Expected Outcomes  Short Term: Continue to assess and modify interventions until short term weight is achieved;Long Term: Adherence to nutrition and physical activity/exercise program aimed toward attainment of established weight goal;Weight Loss: Understanding of general recommendations for a balanced deficit meal plan, which promotes 1-2 lb weight loss per week and includes a negative energy balance of [phone] kcal/d;Understanding recommendations for meals to include 15-35% energy as protein, 25-35% energy from fat, 35-60% energy from carbohydrates, less than 241m of dietary cholesterol, 20-35 gm of total fiber daily;Understanding of distribution of calorie intake throughout the day with the consumption of 4-5 meals/snacks    Lipids  Yes    Intervention  Provide education and support for participant on nutrition & aerobic/resistive exercise along with prescribed medications to achieve LDL <759m HDL >4083m   Expected Outcomes  Short Term: Participant states understanding of desired cholesterol values and is compliant  with medications prescribed. Participant is following exercise prescription and nutrition guidelines.;Long Term: Cholesterol controlled with medications as prescribed, with individualized exercise RX and with personalized nutrition plan. Value goals: LDL < 64m74mDL > 40 mg.       Education:Diabetes - Individual verbal and written instruction to review signs/symptoms of diabetes, desired ranges of glucose level fasting, after meals and with exercise. Acknowledge that pre and post exercise glucose checks will be done for 3 sessions at entry of program.   Education: Know Your Numbers and Risk Factors: -Group verbal and written instruction about important numbers in your health.  Discussion of what are risk factors and how they play a role in the disease process.  Review of Cholesterol, Blood Pressure, Diabetes, and BMI and the role they play in your overall health.   Core Components/Risk Factors/Patient Goals Review:  Goals and Risk Factor Review    Row Name 08/04/19 1329 08/28/19 1554           Core Components/Risk Factors/Patient Goals Review   Personal Goals Review  Weight Management/Obesity;Improve shortness of breath with ADL's  Weight Management/Obesity;Improve shortness of breath with ADL's      Review  DanaBushrataking meds as directed.  She states she has more energy in general and sleeps better.  She had some insomnia after surgery.  She is enjoying getting back to exercise  DanaLouisianataking meds as directed.  She states she has lower energy in general believes it is her INR, she has been sleeping well.  She is enjoying getting back to exercise, feels that it is helping her stress level. Believes her fitness level is increasing can now do ADLs more easily.  Doing a little at a time at home like yoga or walking.      Expected Outcomes  Short - exercise consistently on days not at [facility] - improve overall stamina  Short - exercise consistently on  days not at [facility] - improve overall stamina          Core Components/Risk Factors/Patient Goals at Discharge (Final Review):  Goals and Risk Factor Review - 08/28/19 1554      Core Components/Risk Factors/Patient Goals Review   Personal Goals Review  Weight Management/Obesity;Improve shortness of breath with ADL's    Review  Deondra is taking meds as directed.  She states she has lower energy in general believes it is her INR, she has been sleeping well.  She is enjoying getting back to exercise, feels that it is helping her stress level. Believes her fitness level is increasing can now do ADLs more easily.  Doing a little at a time at home like yoga or walking.    Expected Outcomes  Short - exercise consistently on days not at [facility] Long - improve overall stamina       ITP Comments: ITP Comments    Row Name 07/16/19 1110 07/17/19 1622 07/24/19 1350 08/11/19 1534 08/13/19 0650   ITP Comments  Completed virtual orientation today.  EP eval scheduled for 1/29 at 1330. Documentation for diagnosis can be found in Care Everywhere encounter 06/04/19.  Completed 6MWT and gym orientation.  Initial ITP created and sent for review to Dr. Emily Filbert, Medical Director.  First full day of exercise!  Patient was oriented to gym and equipment including functions, settings, policies, and procedures.  Patient's individual exercise prescription and treatment plan were reviewed.  All starting workloads were established based on the results of the 6 minute walk test done at initial orientation visit.  The plan for exercise progression was also introduced and progression will be customized based on patient's performance and goals.  Completed Initial RD Eval  30 day chart review completed. ITP sent to Dr Zachery Dakins Medical Director, for review,changes as needed and signature.   [place] Name 09/10/19 7062 09/16/19 1523 10/01/19 1201       ITP Comments  30 day chart review completed. ITP sent to Dr Zachery Dakins Medical Director, for review,changes as needed and  signature. Continue with ITP if no changes requested  Florence responded to our email that she will not be able to attend at 330pm at this time.  We offered her some options to consider and are awaiting her response.  Out since 08/28/19.  We will discharge Arushi at this time as she is very busy with her boys and school.        Comments: Discharge ITP

## 2019-10-01 NOTE — Progress Notes (Signed)
Discharge Progress Report  Patient Details  Name: Natasha Boyd MRN: [id number] Date of Birth: 1970-11-11 Referring Provider:     Cardiac Rehab from 07/17/2019 in [facility] Cardiac [facility]  Referring Provider  Bartholome Bill MD       Number of Visits: 11  Reason for Discharge:  Early Exit:  Back to work  Smoking History:  Social History   Tobacco Use  Smoking Status Never Smoker  Smokeless Tobacco Never Used    Diagnosis:  S/P aortic valve replacement  ADL UCSD:   Initial Exercise Prescription: Initial Exercise Prescription - 07/17/19 1600      Date of Initial Exercise RX and Referring Provider   Date  07/17/19    Referring Provider  Bartholome Bill MD      Treadmill   MPH  2.1    Grade  0.5    Minutes  15    METs  2.75      NuStep   Level  3    SPM  80    Minutes  15    METs  2.7      Elliptical   Level  1    Speed  3    Minutes  15      REL-XR   Level  2    Speed  50    Minutes  15    METs  2.7      T5 Nustep   Level  3    SPM  80    Minutes  15    METs  2.7      Prescription Details   Frequency (times per week)  1    Duration  Progress to 30 minutes of continuous aerobic without signs/symptoms of physical distress      Intensity   THRR 40-80% of Max Heartrate  121-154    Ratings of Perceived Exertion  11-13    Perceived Dyspnea  0-4      Progression   Progression  Continue to progress workloads to maintain intensity without signs/symptoms of physical distress.      Resistance Training   Training Prescription  Yes    Weight  3 lb    Reps  10-15       Discharge Exercise Prescription (Final Exercise Prescription Changes): Exercise Prescription Changes - 09/03/19 1500      Response to Exercise   Blood Pressure (Admit)  110/60    Blood Pressure (Exit)  136/70    Heart Rate (Admit)  96 bpm    Heart Rate (Exercise)  120 bpm    Heart Rate (Exit)  103 bpm    Rating of Perceived Exertion (Exercise)  15    Duration   Continue with 30 min of aerobic exercise without signs/symptoms of physical distress.    Intensity  THRR unchanged      Progression   Progression  Continue to progress workloads to maintain intensity without signs/symptoms of physical distress.    Average METs  3.26      Resistance Training   Training Prescription  Yes    Weight  5 lb    Reps  10-15      Interval Training   Interval Training  No      Treadmill   MPH  2.5    Grade  1    Minutes  15    METs  3.26      Elliptical   Level  1    Speed  3  Minutes  15       Functional Capacity: 6 Minute Walk    Row Name 07/17/19 1622         6 Minute Walk   Phase  Initial     Distance  1195 feet     Walk Time  6 minutes     # of Rest Breaks  0     MPH  2.26     METS  3.29     RPE  13     Perceived Dyspnea   1     VO2 Peak  11.54     Symptoms  Yes (comment)     Comments  SOB     Resting HR  87 bpm     Resting BP  114/74     Resting Oxygen Saturation   96 %     Exercise Oxygen Saturation  during 6 min walk  96 %     Max Ex. HR  113 bpm     Max Ex. BP  174/76     2 Minute Post BP  136/66        Psychological, QOL, Others - Outcomes: PHQ 2/9: Depression screen [facility] 2/9 07/18/2019 01/06/2019 12/06/2017 08/15/2016  Decreased Interest 0 0 1 1  Down, Depressed, Hopeless 0 1 0 1  PHQ - 2 Score 0 1 1 2   Altered sleeping 3 1 3 1   Tired, decreased energy 1 0 2 2  Change in appetite 0 0 3 0  Feeling bad or failure about yourself  0 0 0 2  Trouble concentrating 0 0 1 1  Moving slowly or fidgety/restless 0 0 0 0  Suicidal thoughts 0 0 0 0  PHQ-9 Score 4 2 10 8   Difficult doing work/chores Not difficult at all Somewhat difficult Somewhat difficult -    Quality of Life: Quality of Life - 07/18/19 0742      Quality of Life   Select  Quality of Life      Quality of Life Scores   Health/Function Pre  21.83 %    Socioeconomic Pre  27.57 %    Psych/Spiritual Pre  21.79 %    Family Pre  28.8 %    GLOBAL Pre  24.03  %       Personal Goals: Goals established at orientation with interventions provided to work toward goal. Personal Goals and Risk Factors at Admission - 07/18/19 0748      Core Components/Risk Factors/Patient Goals on Admission    Weight Management  Yes;Obesity;Weight Loss    Intervention  Weight Management: Develop a combined nutrition and exercise program designed to reach desired caloric intake, while maintaining appropriate intake of nutrient and fiber, sodium and fats, and appropriate energy expenditure required for the weight goal.;Weight Management: Provide education and appropriate resources to help participant work on and attain dietary goals.;Weight Management/Obesity: Establish reasonable short term and long term weight goals.;Obesity: Provide education and appropriate resources to help participant work on and attain dietary goals.    Admit Weight  280 lb 9.6 oz (127.3 kg)    Goal Weight: Short Term  275 lb (124.7 kg)    Goal Weight: Long Term  250 lb (113.4 kg)    Expected Outcomes  Short Term: Continue to assess and modify interventions until short term weight is achieved;Long Term: Adherence to nutrition and physical activity/exercise program aimed toward attainment of established weight goal;Weight Loss: Understanding of general recommendations for a balanced deficit meal plan, which promotes  1-2 lb weight loss per week and includes a negative energy balance of [phone] kcal/d;Understanding recommendations for meals to include 15-35% energy as protein, 25-35% energy from fat, 35-60% energy from carbohydrates, less than 200mg  of dietary cholesterol, 20-35 gm of total fiber daily;Understanding of distribution of calorie intake throughout the day with the consumption of 4-5 meals/snacks    Lipids  Yes    Intervention  Provide education and support for participant on nutrition & aerobic/resistive exercise along with prescribed medications to achieve LDL 70mg , HDL >40mg .    Expected  Outcomes  Short Term: Participant states understanding of desired cholesterol values and is compliant with medications prescribed. Participant is following exercise prescription and nutrition guidelines.;Long Term: Cholesterol controlled with medications as prescribed, with individualized exercise RX and with personalized nutrition plan. Value goals: LDL < 70mg , HDL > 40 mg.        Personal Goals Discharge: Goals and Risk Factor Review    Row Name 08/04/19 1329 08/28/19 1554           Core Components/Risk Factors/Patient Goals Review   Personal Goals Review  Weight Management/Obesity;Improve shortness of breath with ADL's  Weight Management/Obesity;Improve shortness of breath with ADL's      Review  Huyen is taking meds as directed.  She states she has more energy in general and sleeps better.  She had some insomnia after surgery.  She is enjoying getting back to exercise  Hewan is taking meds as directed.  She states she has lower energy in general believes it is her INR, she has been sleeping well.  She is enjoying getting back to exercise, feels that it is helping her stress level. Believes her fitness level is increasing can now do ADLs more easily.  Doing a little at a time at home like yoga or walking.      Expected Outcomes  Short - exercise consistently on days not at HT Long - improve overall stamina  Short - exercise consistently on days not at HT Long - improve overall stamina         Exercise Goals and Review: Exercise Goals    Row Name 07/17/19 1625             Exercise Goals   Increase Physical Activity  Yes       Intervention  Provide advice, education, support and counseling about physical activity/exercise needs.;Develop an individualized exercise prescription for aerobic and resistive training based on initial evaluation findings, risk stratification, comorbidities and participant's personal goals.       Expected Outcomes  Short Term: Attend rehab on a regular basis to  increase amount of physical activity.;Long Term: Add in home exercise to make exercise part of routine and to increase amount of physical activity.;Long Term: Exercising regularly at least 3-5 days a week.       Increase Strength and Stamina  Yes       Intervention  Provide advice, education, support and counseling about physical activity/exercise needs.;Develop an individualized exercise prescription for aerobic and resistive training based on initial evaluation findings, risk stratification, comorbidities and participant's personal goals.       Expected Outcomes  Short Term: Increase workloads from initial exercise prescription for resistance, speed, and METs.;Short Term: Perform resistance training exercises routinely during rehab and add in resistance training at home;Long Term: Improve cardiorespiratory fitness, muscular endurance and strength as measured by increased METs and functional capacity (6MWT)       Able to understand and use rate of perceived  exertion (RPE) scale  Yes       Intervention  Provide education and explanation on how to use RPE scale       Expected Outcomes  Short Term: Able to use RPE daily in rehab to express subjective intensity level;Long Term:  Able to use RPE to guide intensity level when exercising independently       Able to understand and use Dyspnea scale  Yes       Intervention  Provide education and explanation on how to use Dyspnea scale       Expected Outcomes  Short Term: Able to use Dyspnea scale daily in rehab to express subjective sense of shortness of breath during exertion;Long Term: Able to use Dyspnea scale to guide intensity level when exercising independently       Knowledge and understanding of Target Heart Rate Range (THRR)  Yes       Intervention  Provide education and explanation of THRR including how the numbers were predicted and where they are located for reference       Expected Outcomes  Short Term: Able to state/look up THRR;Short Term: Able to  use daily as guideline for intensity in rehab;Long Term: Able to use THRR to govern intensity when exercising independently       Able to check pulse independently  Yes       Intervention  Provide education and demonstration on how to check pulse in carotid and radial arteries.;Review the importance of being able to check your own pulse for safety during independent exercise       Expected Outcomes  Short Term: Able to explain why pulse checking is important during independent exercise;Long Term: Able to check pulse independently and accurately       Understanding of Exercise Prescription  Yes       Intervention  Provide education, explanation, and written materials on patient's individual exercise prescription       Expected Outcomes  Short Term: Able to explain program exercise prescription;Long Term: Able to explain home exercise prescription to exercise independently          Exercise Goals Re-Evaluation: Exercise Goals Re-Evaluation    Row Name 07/24/19 1351 08/04/19 1342 08/05/19 1304 08/20/19 1326 08/28/19 1605     Exercise Goal Re-Evaluation   Exercise Goals Review  Increase Physical Activity;Able to understand and use rate of perceived exertion (RPE) scale;Knowledge and understanding of Target Heart Rate Range (THRR);Understanding of Exercise Prescription;Increase Strength and Stamina;Able to check pulse independently  Increase Physical Activity;Increase Strength and Stamina;Able to understand and use rate of perceived exertion (RPE) scale;Able to understand and use Dyspnea scale;Knowledge and understanding of Target Heart Rate Range (THRR);Able to check pulse independently;Understanding of Exercise Prescription  Increase Physical Activity;Increase Strength and Stamina;Able to understand and use rate of perceived exertion (RPE) scale;Able to understand and use Dyspnea scale;Knowledge and understanding of Target Heart Rate Range (THRR);Able to check pulse independently;Understanding of  Exercise Prescription  Increase Physical Activity;Increase Strength and Stamina;Understanding of Exercise Prescription  Increase Physical Activity;Increase Strength and Stamina;Understanding of Exercise Prescription   Comments  Reviewed RPE scale, THR and program prescription with pt today.  Pt voiced understanding and was given a copy of goals to take home.  Reviewed home exercise with pt today.  Pt plans to walk, do [employer] and possibly [employer] for exercise.  Reviewed THR, pulse, RPE, sign and symptoms, NTG use, and when to call 911 or MD.  Also discussed weather considerations and indoor options.  Pt voiced understanding.  --  Kiaria is transitioning back to full time and has her kids back in school.  She is doing well in rehab.  She is up to level 4 on the XR.  We will continue to monitor her progress.  Yarethzi is feeling that she got back to work full time too fast.  She is very tired at the end of the week.  She is doing some walking on her off days.  With a shift in scheduling again, [facility] may only be able to come on Wednesdays for this month.  Once things settle down again, she hopes to get back to it.  She promises to keep up with his exercise.   Expected Outcomes  Short: Use RPE daily to regulate intensity. Long: Follow program prescription in THR.  Short - exercise 30-5 days per week consistently Long : improve overall stamina  --  Short: Continue to make time to exercise in her day.  Long: Continue to improve stamina.  Short: Continue to exercise when not able to come to class.  Long: Conitnue to improve stamina.   [place] Name 09/03/19 1548 09/16/19 1523           Exercise Goal Re-Evaluation   Exercise Goals Review  Increase Physical Activity;Increase Strength and Stamina;Able to understand and use rate of perceived exertion (RPE) scale;Able to understand and use Dyspnea scale;Knowledge and understanding of Target Heart Rate Range (THRR);Able to check pulse independently;Understanding of  Exercise Prescription  --      Comments  Chayla is considering joining [employer] and likes [employer] for exercise at home.  Out since last review, considering class change or virtual program.      Expected Outcomes  Short : exercise consistently at home if she cant attend HT Long:  improve overall stamina  --         Nutrition & Weight - Outcomes: Pre Biometrics - 07/17/19 1625      Pre Biometrics   Height  5' 5.5" (1.664 m)    Weight  280 lb 9.6 oz (127.3 kg)    BMI (Calculated)  45.97    Single Leg Stand  30 seconds        Nutrition: Nutrition Therapy & Goals - 08/11/19 1502      Nutrition Therapy   Drug/Food Interactions  Coumadin/Vit K      Personal Nutrition Goals   Nutrition Goal  ST: add more satiating snacks LT: get into good routine    Comments  B: Slimfast 2 cups coffee (7am) MS: sargento balance breaks L: sandwich, chips, and carrots (wheat) - PBJ or [place] or soup (microwavable ones) or salad MS: chocolate or skinny pop popcorn D: varies pt states that her downfall is CHOs S: chocolate, wine, popcorn. Pt reports drinking water, diet cran-cherry 1 glass with dinner. Pt reports "stress eating" on some days. Discussed HH eating and balanced eating.      Intervention Plan   Intervention  Prescribe, educate and counsel regarding individualized specific dietary modifications aiming towards targeted core components such as weight, hypertension, lipid management, diabetes, heart failure and other comorbidities.;Nutrition handout(s) given to patient.    Expected Outcomes  Short Term Goal: Understand basic principles of dietary content, such as calories, fat, sodium, cholesterol and nutrients.;Short Term Goal: A plan has been developed with personal nutrition goals set during dietitian appointment.;Long Term Goal: Adherence to prescribed nutrition plan.       Nutrition Discharge: Nutrition Assessments - 07/18/19 0747  MEDFICTS Scores   Pre Score  66        Education Questionnaire Score: Knowledge Questionnaire Score - 07/18/19 0747      Knowledge Questionnaire Score   Pre Score  24/26 Education Focus: PAD, MI       Goals reviewed with patient; copy given to patient.

## 2019-10-16 ENCOUNTER — Other Ambulatory Visit: Payer: Self-pay | Admitting: Family Medicine

## 2019-10-16 DIAGNOSIS — F419 Anxiety disorder, unspecified: Secondary | ICD-10-CM

## 2019-10-16 NOTE — Telephone Encounter (Signed)
Requested  medications are  due for refill today yes  Requested medications are on the active medication list yes  Last refill 3/30  Future visit scheduled no  Last visit July 2020  Notes to clinic Failed protocol due to no visit within 6 months

## 2019-10-22 NOTE — Progress Notes (Signed)
Established patient visit  I,Natasha Boyd,acting as a scribe for Natasha Durie, MD.,have documented all relevant documentation on the behalf of Natasha Durie, MD,as directed by  Natasha Durie, MD while in the presence of Natasha Durie, MD.   Patient: Natasha Boyd   DOB: March 27, 1971   49 y.o. Female  MRN: [id number] Visit Date: 10/23/2019  Today's healthcare provider: Wilhemena Durie, MD   Chief Complaint  Patient presents with  . Hospitalization Follow-up   Subjective    HPI   Follow up ER visit In December patient had aortic valve replacement at [facility].  This was done via right minithoracotomy.  She did well with the surgery and has done well postoperatively.  She is now using CPAP regularly and feels better with this.  Feels better since her valve replacement.  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  She has had some discomfort after accidental trauma to the region.  She thinks her emergency room visit from a week ago was anxiety related. Patient was seen in ER for Chest pain on 10/17/2019. She was treated for Chest Pain. Treatment for this included; see note in chart. She reports good compliance with treatment. She reports this condition is Improved.  --------------------------------------------------------------------  Patient states she has improved since ER visit. Patient states at the time of ER visit she was very fatigued. Patient spoke to Dr. Bethanne Ginger nurse on Monday concerning ER visit.   Past Medical History:  Diagnosis Date  . Allergic rhinitis   . Anginal pain ([place])   . Anxiety   . Aortic valve stenosis   . Benign neoplasm of skin   . Dyspnea   . GERD (gastroesophageal reflux disease)   . Heart murmur   . Obesity   . Polydipsia   . Sleep apnea    uses CPAP at night       Medications: Outpatient Medications Prior to Visit  Medication Sig  . escitalopram (LEXAPRO) 20 MG tablet TAKE 1 TABLET(20 MG) BY  MOUTH DAILY  . fluticasone (FLONASE) 50 MCG/ACT nasal spray Place 2 sprays into the nose as needed for allergies.   . furosemide (LASIX) 40 MG tablet Take by mouth.  . metoprolol tartrate (LOPRESSOR) 25 MG tablet Take 0.5 tablets (12.5 mg total) by mouth 2 (two) times daily.  Marland Kitchen warfarin (COUMADIN) 2 MG tablet Take by mouth.  . hydrochlorothiazide (HYDRODIURIL) 25 MG tablet Take 25 mg by mouth daily.  . lansoprazole (PREVACID) 30 MG capsule Take 1 capsule (30 mg total) by mouth daily at 12 noon. (Patient not taking: Reported on 10/23/2019)  . metoprolol tartrate (LOPRESSOR) 25 MG tablet Take by mouth.  . Potassium Chloride 40 MEQ/15ML (20%) SOLN Take by mouth.  . [DISCONTINUED] amLODipine (NORVASC) 5 MG tablet Take 5 mg by mouth at bedtime.    Facility-Administered Medications Prior to Visit  Medication Dose Route Frequency Provider  . sodium chloride flush (NS) 0.9 % injection 3 mL  3 mL Intravenous Q12H Teodoro Spray, MD    Review of Systems  Constitutional: Negative for appetite change, chills, fatigue and fever.  HENT: Negative.   Eyes:       She has increased floaters recently.  Respiratory: Negative for chest tightness and shortness of breath.   Cardiovascular: Negative for chest pain and palpitations.  Gastrointestinal: Negative for abdominal pain, nausea and vomiting.  Endocrine: Negative.   Allergic/Immunologic: Negative.   Neurological: Negative for dizziness and weakness.  Psychiatric/Behavioral:  Negative.        Objective    BP 116/73 (BP Location: Left Arm, Patient Position: Sitting, Cuff Size: Large)   Pulse 79   Temp (!) 96.9 F (36.1 C) (Other (Comment))   Resp 16   Ht 5\' 6"  (1.676 m)   Wt 285 lb (129.3 kg)   SpO2 97%   BMI 46.00 kg/m  BP Readings from Last 3 Encounters:  10/23/19 116/73  04/16/19 103/69  01/06/19 128/74   Wt Readings from Last 3 Encounters:  10/23/19 285 lb (129.3 kg)  07/17/19 280 lb 9.6 oz (127.3 kg)  04/16/19 280 lb (127 kg)        Physical Exam Vitals reviewed.  Constitutional:      Appearance: She is well-developed. She is obese.  HENT:     Head: Normocephalic and atraumatic.     Right Ear: External ear normal.     Left Ear: External ear normal.     Nose: Nose normal.  Eyes:     General: No scleral icterus.    Conjunctiva/sclera: Conjunctivae normal.     Comments: Visual fields intact.  Neck:     Thyroid: No thyromegaly.  Cardiovascular:     Rate and Rhythm: Normal rate and regular rhythm.     Comments: Aortic click Pulmonary:     Effort: Pulmonary effort is normal.     Breath sounds: Normal breath sounds.  Abdominal:     Palpations: Abdomen is soft.  Lymphadenopathy:     Cervical: No cervical adenopathy.  Skin:    General: Skin is warm and dry.  Neurological:     General: No focal deficit present.     Mental Status: She is alert and oriented to person, place, and time.  Psychiatric:        Mood and Affect: Mood normal.        Behavior: Behavior normal.        Thought Content: Thought content normal.        Judgment: Judgment normal.       No results found for any visits on 10/23/19.  Assessment & Plan     1. Other chest pain This is resolved.  I think this was probably anxiety driven.  She is feeling better and has talked to Dr. Bethanne Ginger office and has follow-up with him soon.  2. Obstructive sleep apnea syndrome She is using CPAP nightly.  3. Anxiety Clinically stable.  I will see her back this summer  4. Adult-onset obesity Diet and exercise discussed now that she is feeling better from her surgery.  5. Status post aortic valve replacement Doing extremely well postoperatively.    No follow-ups on file.         Liala Codispoti Cranford Mon, MD  [facility] [phone] (phone) [phone] (fax)  [place]

## 2019-10-23 ENCOUNTER — Ambulatory Visit (INDEPENDENT_AMBULATORY_CARE_PROVIDER_SITE_OTHER): Payer: 59 | Admitting: Family Medicine

## 2019-10-23 ENCOUNTER — Encounter: Payer: Self-pay | Admitting: Family Medicine

## 2019-10-23 ENCOUNTER — Other Ambulatory Visit: Payer: Self-pay

## 2019-10-23 VITALS — BP 116/73 | HR 79 | Temp 96.9°F | Resp 16 | Ht 66.0 in | Wt 285.0 lb

## 2019-10-23 DIAGNOSIS — F419 Anxiety disorder, unspecified: Secondary | ICD-10-CM

## 2019-10-23 DIAGNOSIS — G4733 Obstructive sleep apnea (adult) (pediatric): Secondary | ICD-10-CM | POA: Diagnosis not present

## 2019-10-23 DIAGNOSIS — R0789 Other chest pain: Secondary | ICD-10-CM | POA: Diagnosis not present

## 2019-10-23 DIAGNOSIS — E669 Obesity, unspecified: Secondary | ICD-10-CM | POA: Diagnosis not present

## 2019-10-23 DIAGNOSIS — Z952 Presence of prosthetic heart valve: Secondary | ICD-10-CM

## 2020-01-22 ENCOUNTER — Ambulatory Visit: Payer: Self-pay | Admitting: Family Medicine

## 2020-02-02 ENCOUNTER — Other Ambulatory Visit: Payer: Self-pay | Admitting: Family Medicine

## 2020-02-02 DIAGNOSIS — F419 Anxiety disorder, unspecified: Secondary | ICD-10-CM

## 2020-03-01 IMAGING — MR MR MRA NECK W/O CM
12 of 14 series · 26 of 48 positions shown · non-contrast
Comparison: Head CT without contrast 7848 hours today. Brain MRI
08/14/2010.

CLINICAL DATA: 47-year-old female with sudden painless loss of
vision in both eyes while at work, 0595 hours today. Vision
returned, but blurry. Elevated blood pressure.



[Series 5: TOF · axial · 0.5mm · 0.41mm/px · z∈[-26,+76]mm · 8 of 205 slices shown]
[im 1/205]
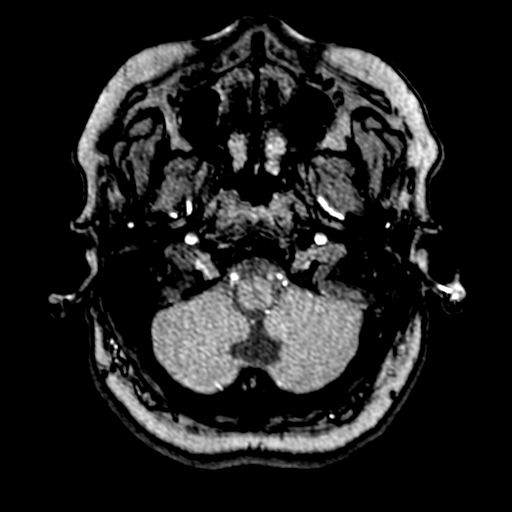
[im 23/205]
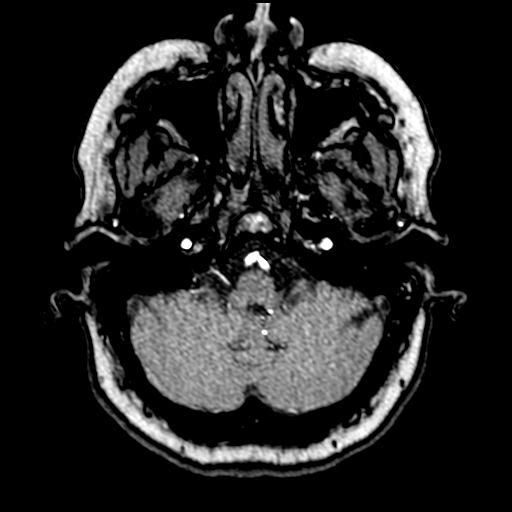
[im 69/205]
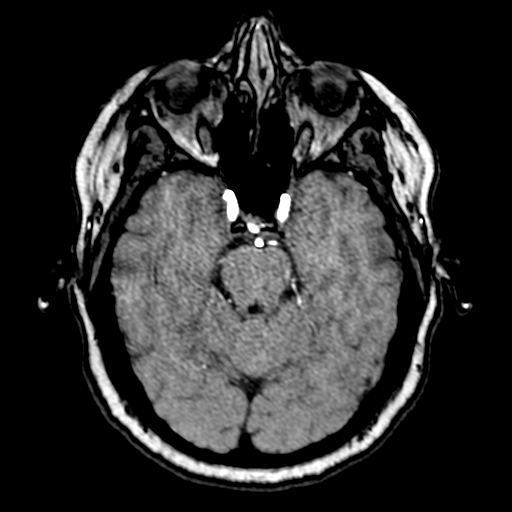
[im 91/205]
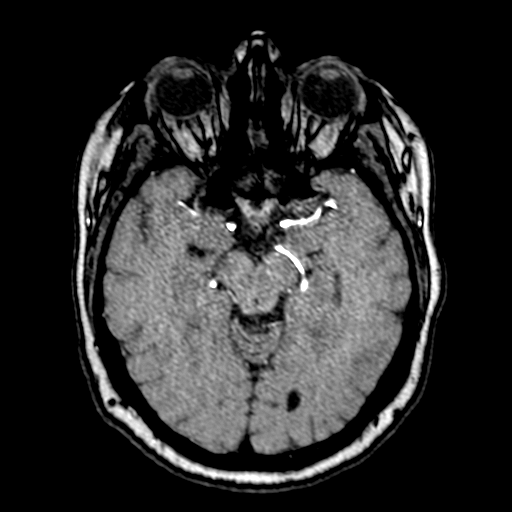
[im 114/205]
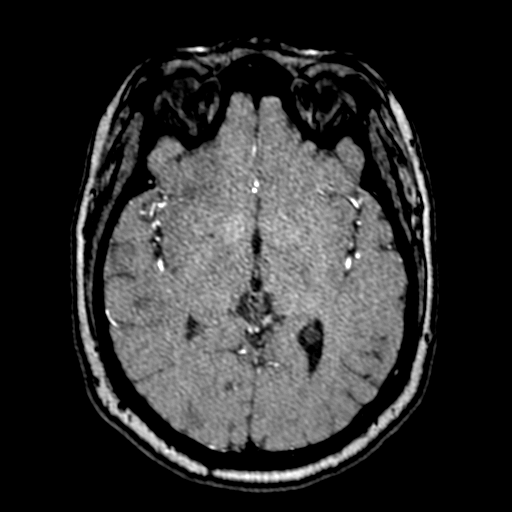
[im 137/205]
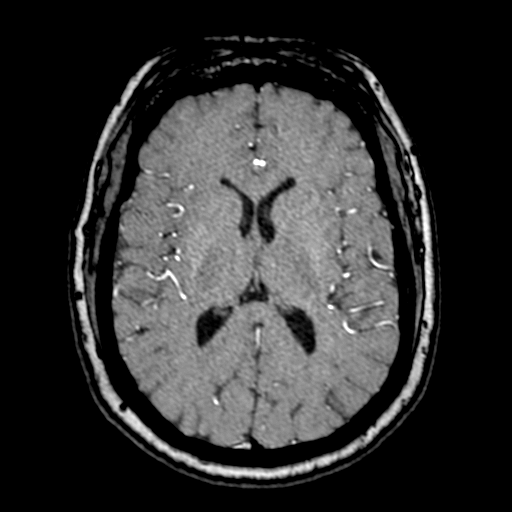
[im 182/205]
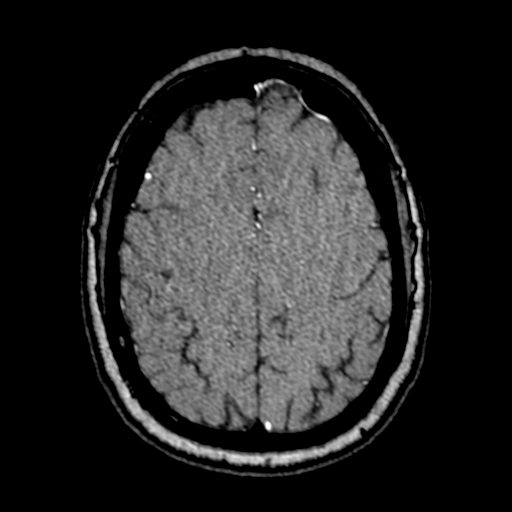
[im 205/205]
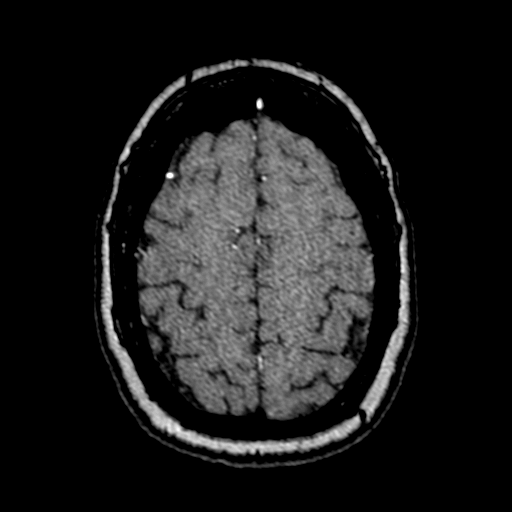

[Series 11: ax dwi_tracew · axial · 3.0mm · 0.83mm/px · z∈[-31,+130]mm · 3 of 55 slices shown]
[im 1/55]
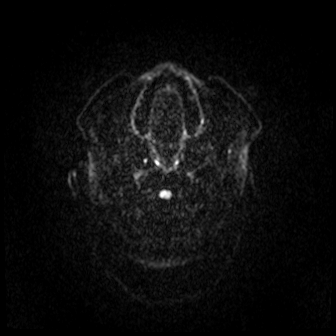
[im 28/55]
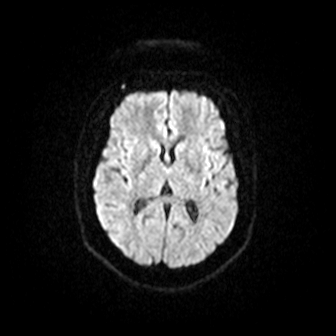
[im 55/55]
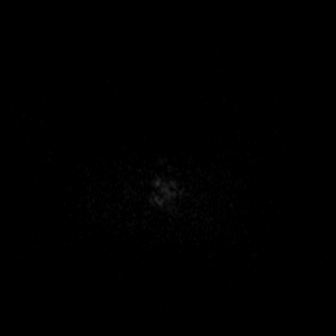

[Series 12: ax dwi_adc · axial · 3.0mm · 0.83mm/px · z∈[-31,+130]mm · 3 of 55 slices shown]
[im 1/55]
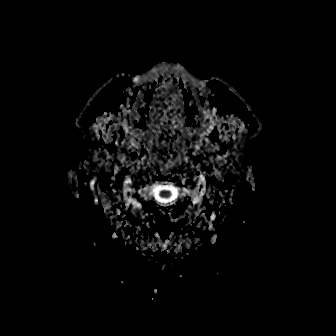
[im 28/55]
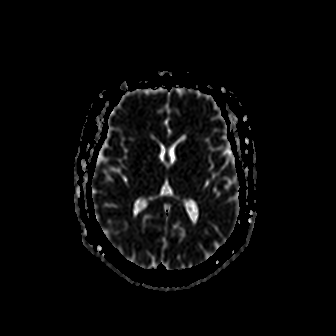
[im 55/55]
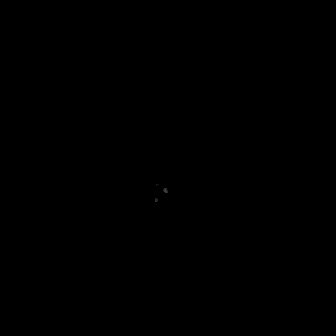

[Series 13: cor dwi_tracew · coronal · 5.0mm · 0.68mm/px · 2 of 38 slices shown]
[im 1/38]
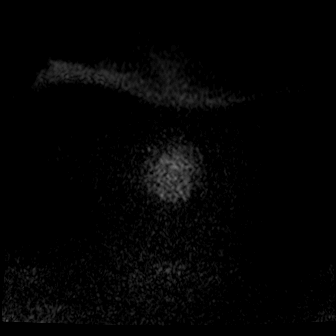
[im 38/38]
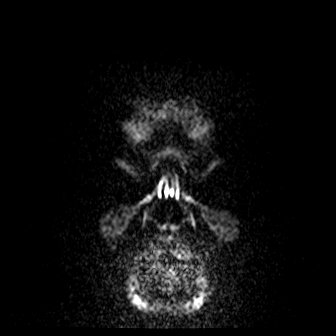

[Series 14: cor dwi_adc · coronal · 5.0mm · 0.68mm/px · 2 of 38 slices shown]
[im 1/38]
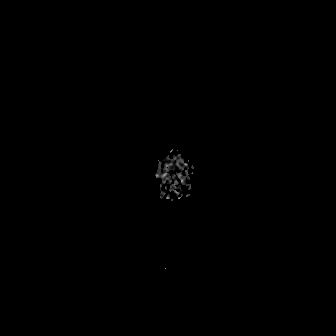
[im 38/38]
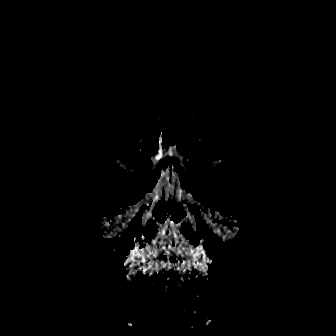

[Series 15: T1 · sagittal · 5.0mm · 0.94mm/px · 1 of 23 slices shown (1 of 2)]
[im 1/23]
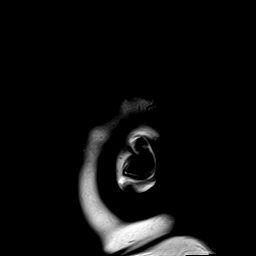

[Series 16: FLAIR · axial · 5.0mm · 1.20mm/px · 1 of 27 slices shown]
[im 1/27]
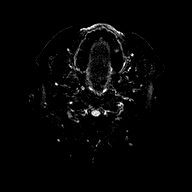

[Series 17: T2-star · axial · 5.0mm · 0.45mm/px · 1 of 27 slices shown]
[im 1/27]
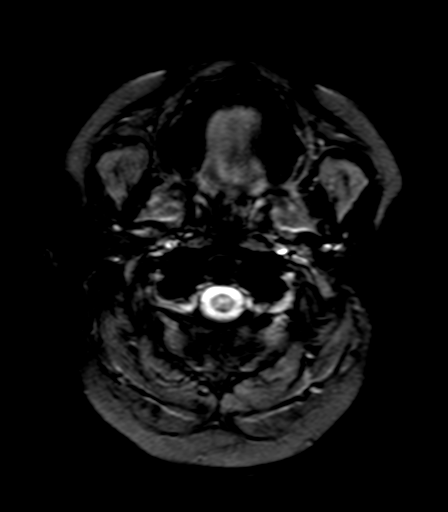

[Series 18: T2 · axial · 5.0mm · 0.45mm/px · 1 of 27 slices shown (1 of 2)]
[im 1/27]
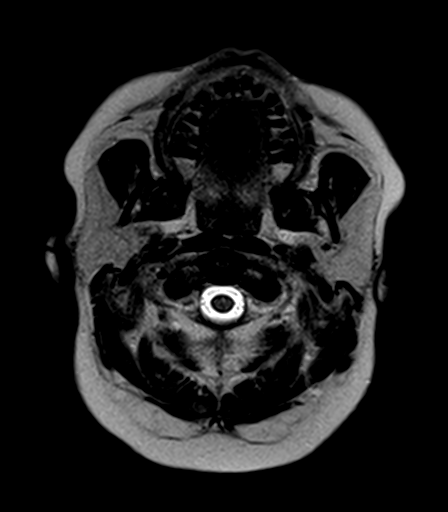

[Series 19: T1 · axial · 5.0mm · 0.90mm/px · 1 of 27 slices shown (2 of 2)]
[im 1/27]
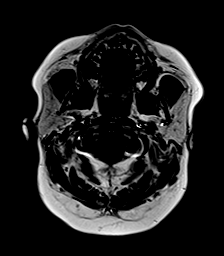

[Series 20: T2 · coronal · 5.0mm · 0.45mm/px · 2 of 31 slices shown (2 of 2)]
[im 1/31]
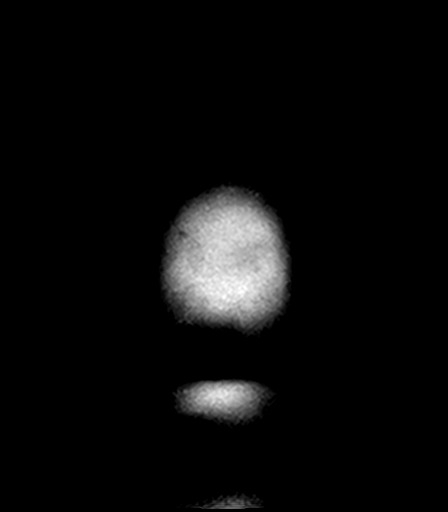
[im 31/31]
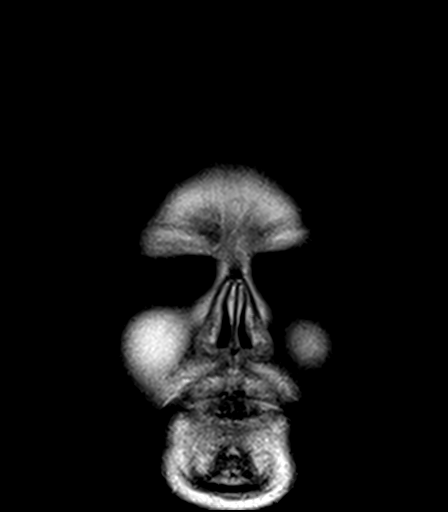

[Series 1090: l-r rcca · 0.47mm/px · 1 of 19 slices shown]
[im 1/19]
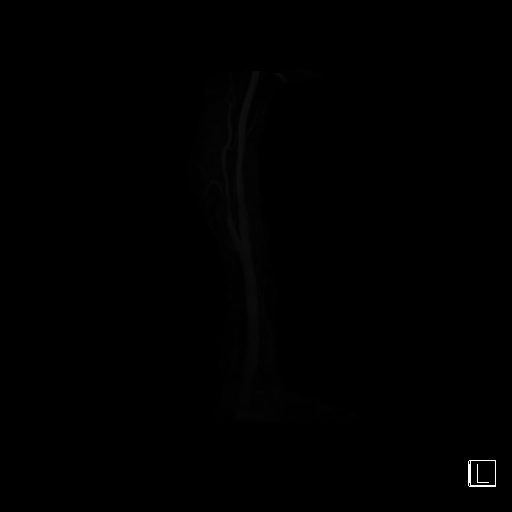

[26 of 48 positions shown; findings below may reference images not displayed]

FINDINGS: MRI HEAD FINDINGS

Brain: No restricted diffusion to suggest acute infarction. No
midline shift, mass effect, evidence of mass lesion,
ventriculomegaly, extra-axial collection or acute intracranial
hemorrhage. Cervicomedullary junction and pituitary are within
normal limits.

Stable cerebral volume since 3123. Gray and white matter signal is
stable and normal for age. No cortical encephalomalacia or chronic
cerebral blood products identified.

Vascular: Major intracranial vascular flow voids appear stable since
3123.

Skull and upper cervical spine: Visualized bone marrow signal is
within normal limits. Negative visible cervical spine.

Sinuses/Orbits: Normal suprasellar cistern and optic chiasm. Orbit
soft tissues appear symmetric, stable, and negative.

Paranasal sinuses and mastoids are stable since 3123 and well
pneumatized.

Other: Visible internal auditory structures appear normal. Scalp and
face soft tissues appear negative.

MRA NECK FINDINGS

Time-of-flight images demonstrate antegrade flow signal in both
cervical carotid and vertebral arteries. The carotid bifurcations
appear normal. The vertebral arteries are codominant. Antegrade flow
signal continues to the skull base. No stenosis in the neck.

MRA HEAD FINDINGS

Antegrade flow in the posterior circulation with codominant distal
vertebral arteries. Both PICA origins appear patent on the neck MRA
images. No distal vertebral stenosis. Patent vertebrobasilar
junction and basilar artery. Normal SCA and PCA origins. Right
posterior communicating artery is present while the left is
diminutive or absent. Bilateral PCA branches appear symmetric and
normal.

Antegrade flow in both ICA siphons. No siphon stenosis. Both
ophthalmic artery origins appear patent (series 5, image 85). Patent
carotid termini. Normal MCA and ACA origins. Anterior communicating
artery and visible ACA branches are within normal limits. Bilateral
MCA M1 segments, MCA bi/trifurcation, and visible bilateral MCA
branches are within normal limits.
IMPRESSION: 1.  No acute intracranial abnormality.
Stable since 3123 and normal for age noncontrast MRI appearance of
the brain.
Orbits appear negative.
2. Negative MRA head and neck.

## 2020-05-13 ENCOUNTER — Other Ambulatory Visit: Payer: Self-pay | Admitting: Family Medicine

## 2020-05-13 DIAGNOSIS — F419 Anxiety disorder, unspecified: Secondary | ICD-10-CM

## 2020-06-12 ENCOUNTER — Other Ambulatory Visit: Payer: Self-pay | Admitting: Family Medicine

## 2020-06-12 DIAGNOSIS — F419 Anxiety disorder, unspecified: Secondary | ICD-10-CM

## 2020-07-13 ENCOUNTER — Other Ambulatory Visit
Admission: RE | Admit: 2020-07-13 | Discharge: 2020-07-13 | Disposition: A | Discharge status: Home / Self Care | Payer: 59 | Source: Ambulatory Visit | Attending: Rehabilitative and Restorative Service Providers" | Admitting: Rehabilitative and Restorative Service Providers"

## 2020-07-13 DIAGNOSIS — R0602 Shortness of breath: Secondary | ICD-10-CM | POA: Insufficient documentation

## 2020-07-13 DIAGNOSIS — I350 Nonrheumatic aortic (valve) stenosis: Secondary | ICD-10-CM | POA: Insufficient documentation

## 2020-07-13 LAB — BRAIN NATRIURETIC PEPTIDE: B Natriuretic Peptide: 115.2 pg/mL — ABNORMAL HIGH (ref 0.0–100.0)

## 2020-08-20 ENCOUNTER — Other Ambulatory Visit: Payer: Self-pay | Admitting: Family Medicine

## 2020-08-20 DIAGNOSIS — F419 Anxiety disorder, unspecified: Secondary | ICD-10-CM

## 2020-09-19 ENCOUNTER — Other Ambulatory Visit: Payer: Self-pay | Admitting: Family Medicine

## 2020-09-19 DIAGNOSIS — F419 Anxiety disorder, unspecified: Secondary | ICD-10-CM

## 2020-09-19 NOTE — Telephone Encounter (Signed)
Requested medication (s) are due for refill today: yes  Requested medication (s) are on the active medication list: yes  Last refill:  08/20/20 #30 day courtesy RF  Future visit scheduled: no  Notes to clinic:   Called pt and LM on VM to call office to schedule OV   Requested Prescriptions  Pending Prescriptions Disp Refills   escitalopram (LEXAPRO) 20 MG tablet [Pharmacy Med Name: ESCITALOPRAM 20MG  TABLETS] 30 tablet 0    Sig: TAKE 1 TABLET(20 MG) BY MOUTH DAILY      Psychiatry:  Antidepressants - SSRI Failed - 09/19/2020  6:23 AM      Failed - Valid encounter within last 6 months    Recent Outpatient Visits           11 months ago Other chest pain   [facility] Jerrol Banana., MD   1 year ago Annual physical exam   [facility] Jerrol Banana., MD   1 year ago Chest pain, unspecified type   [facility] Jerrol Banana., MD   2 years ago Hypertension, unspecified type   [facility] Jerrol Banana., MD   2 years ago Hypertension, unspecified type   [facility] Jerrol Banana., MD

## 2020-10-19 ENCOUNTER — Other Ambulatory Visit: Payer: Self-pay | Admitting: Family Medicine

## 2020-10-19 DIAGNOSIS — F419 Anxiety disorder, unspecified: Secondary | ICD-10-CM

## 2020-10-19 NOTE — Telephone Encounter (Signed)
Requested medications are due for refill today.  yes  Requested medications are on the active medications list.  yes  Last refill. 09/20/2020  Future visit scheduled.   no  Notes to clinic.  Patient already given courtesy refill.

## 2020-10-19 NOTE — Telephone Encounter (Signed)
   Notes to clinic: Courtesy refill has been given Patient has not scheduled office visit    Requested Prescriptions  Pending Prescriptions Disp Refills   escitalopram (LEXAPRO) 20 MG tablet [Pharmacy Med Name: ESCITALOPRAM 20MG  TABLETS] 30 tablet 0    Sig: TAKE 1 TABLET(20 MG) BY MOUTH DAILY      Psychiatry:  Antidepressants - SSRI Failed - 10/19/2020  6:22 AM      Failed - Valid encounter within last 6 months    Recent Outpatient Visits           12 months ago Other chest pain   [facility] Jerrol Banana., MD   1 year ago Annual physical exam   [facility] Jerrol Banana., MD   1 year ago Chest pain, unspecified type   [facility] Jerrol Banana., MD   2 years ago Hypertension, unspecified type   [facility] Jerrol Banana., MD   2 years ago Hypertension, unspecified type   [facility] Jerrol Banana., MD

## 2020-11-18 ENCOUNTER — Telehealth: Payer: Self-pay | Admitting: Family Medicine

## 2020-11-18 DIAGNOSIS — F419 Anxiety disorder, unspecified: Secondary | ICD-10-CM

## 2020-11-18 NOTE — Telephone Encounter (Signed)
Requested medications are due for refill today.  yes  Requested medications are on the active medications list.  yes  Last refill. 10/19/2020  Future visit scheduled.   no  Notes to clinic.  Courtesy refill already given.

## 2020-12-27 NOTE — Telephone Encounter (Signed)
Patient will run out of medication and would like PCP to refill until 01/19/2021 follow up appointment

## 2020-12-31 MED ORDER — ESCITALOPRAM OXALATE 20 MG PO TABS: ORAL_TABLET | ORAL | 0 refills | Status: DC

## 2020-12-31 NOTE — Telephone Encounter (Signed)
Rx sent to pharmacy   

## 2020-12-31 NOTE — Addendum Note (Signed)
Addended by: Julieta Bellini on: 12/31/2020 11:52 AM   Modules accepted: Orders

## 2021-01-17 ENCOUNTER — Ambulatory Visit: Payer: 59 | Admitting: Family Medicine

## 2021-01-25 ENCOUNTER — Encounter: Payer: Self-pay | Admitting: Family Medicine

## 2021-01-25 ENCOUNTER — Ambulatory Visit (INDEPENDENT_AMBULATORY_CARE_PROVIDER_SITE_OTHER): Payer: PRIVATE HEALTH INSURANCE | Admitting: Family Medicine

## 2021-01-25 ENCOUNTER — Other Ambulatory Visit: Payer: Self-pay

## 2021-01-25 VITALS — BP 114/76 | HR 80 | Resp 16 | Ht 66.0 in | Wt 297.0 lb

## 2021-01-25 DIAGNOSIS — K219 Gastro-esophageal reflux disease without esophagitis: Secondary | ICD-10-CM

## 2021-01-25 DIAGNOSIS — Z952 Presence of prosthetic heart valve: Secondary | ICD-10-CM | POA: Diagnosis not present

## 2021-01-25 DIAGNOSIS — F419 Anxiety disorder, unspecified: Secondary | ICD-10-CM

## 2021-01-25 DIAGNOSIS — R5383 Other fatigue: Secondary | ICD-10-CM

## 2021-01-25 DIAGNOSIS — Z6841 Body Mass Index (BMI) 40.0 and over, adult: Secondary | ICD-10-CM

## 2021-01-25 DIAGNOSIS — E782 Mixed hyperlipidemia: Secondary | ICD-10-CM

## 2021-01-25 DIAGNOSIS — G4733 Obstructive sleep apnea (adult) (pediatric): Secondary | ICD-10-CM

## 2021-01-25 DIAGNOSIS — B354 Tinea corporis: Secondary | ICD-10-CM

## 2021-01-25 MED ORDER — ESCITALOPRAM OXALATE 20 MG PO TABS: ORAL_TABLET | ORAL | 11 refills | Status: DC

## 2021-01-25 MED ORDER — CLOTRIMAZOLE-BETAMETHASONE 1-0.05 % EX CREA: 1.0000 "application " | TOPICAL_CREAM | Freq: Two times a day (BID) | CUTANEOUS | 0 refills | Status: DC

## 2021-01-25 NOTE — Progress Notes (Signed)
I,[name],acting as a scribe for Wilhemena Durie, MD.,have documented all relevant documentation on the behalf of Wilhemena Durie, MD,as directed by  Wilhemena Durie, MD while in the presence of Wilhemena Durie, MD.   Established patient visit   Patient: Natasha Boyd   DOB: 12/06/1970   50 y.o. 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  Down 10 to 12 pounds this summer with exercising.  However she is gained weight and continues to have problems with her sleep apnea.  She is snoring through her CPAP with her weight gain. She has a rash in recent weeks over her right anterior arm. Anxiety, Follow-up  She was last seen for anxiety 1 years ago. Changes made at last visit include no medication changes. Continue Lexapro '20mg'$  daily.    She reports good compliance with treatment. She reports good tolerance of treatment. She is not having side effects. none  She feels her anxiety is mild and Unchanged since last visit.  Symptoms: No chest pain No difficulty concentrating  No dizziness No fatigue  No feelings of losing control No insomnia  No irritable No palpitations  No panic attacks No racing thoughts  No shortness of breath No sweating  No tremors/shakes    GAD-7 Results GAD-7 Generalized Anxiety Disorder Screening Tool 01/25/2021 12/06/2017  1. Feeling Nervous, Anxious, or on Edge 1 2  2. Not Being Able to Stop or Control Worrying 0 2  3. Worrying Too Much About Different Things 1 2  4. Trouble Relaxing 0 3  5. Being So Restless it's Hard To Sit Still 2 1  6. Becoming Easily Annoyed or Irritable 0 1  7. Feeling Afraid As If Something Awful Might Happen 0 0  Total GAD-7 Score 4 11  Difficulty At Work, Home, or Getting  Along With  Others? Not difficult at all Somewhat difficult    PHQ-9 Scores PHQ9 SCORE ONLY 07/18/2019 01/06/2019 12/06/2017  PHQ-9 Total Score '4 2 10    '$ -----------------------------------------------------------------------------------------------  Follow up for sleep apnea  The patient was last seen for this 1 years ago. Changes made at last visit include no changes. Continue CPAP nightly.   She reports good compliance with treatment. She feels that condition is Worse. She is not having side effects. none  Patient states she has been hearing herself snore through the cpcp mask. ----------------------------------------------------------------------------------------- Follow up for obesity   The patient was last seen for this 1 years ago. Changes made at last visit include no medication changes.  She reports poor compliance with treatment. She feels that condition is Worse. She is not having side effects. none  -----------------------------------------------------------------------------------------      Medications: Outpatient Medications Prior to Visit  Medication Sig   fluticasone (FLONASE) 50 MCG/ACT nasal spray Place 2 sprays into the nose as needed for allergies.    furosemide (LASIX) 40 MG tablet Take by mouth.   metoprolol tartrate (LOPRESSOR) 25 MG tablet Take 0.5 tablets (12.5 mg total) by mouth 2 (two) times daily.   metoprolol tartrate (LOPRESSOR) 25 MG tablet Take by mouth.   warfarin (COUMADIN) 7.5 MG tablet TAKE 1 TABLET(7.5 MG) BY MOUTH EVERY DAY   [DISCONTINUED] escitalopram (LEXAPRO) 20 MG tablet TAKE 1  TABLET(20 MG) BY MOUTH DAILY   hydrochlorothiazide (HYDRODIURIL) 25 MG tablet Take 25 mg by mouth daily. (Patient not taking: Reported on 01/25/2021)   lansoprazole (PREVACID) 30 MG capsule Take 1 capsule (30 mg total) by mouth daily at 12 noon. (Patient not taking: No sig reported)   [DISCONTINUED] Potassium Chloride 40 MEQ/15ML (20%) SOLN Take by mouth.    [DISCONTINUED] warfarin (COUMADIN) 2 MG tablet Take by mouth.   Facility-Administered Medications Prior to Visit  Medication Dose Route Frequency Provider   sodium chloride flush (NS) 0.9 % injection 3 mL  3 mL Intravenous Q12H Teodoro Spray, MD    Review of Systems  Constitutional:  Negative for activity change and fatigue.  Respiratory:  Negative for cough and shortness of breath.   Cardiovascular:  Negative for chest pain, palpitations and leg swelling.  Musculoskeletal:  Negative for arthralgias and myalgias.  Neurological:  Negative for dizziness, light-headedness and headaches.  Psychiatric/Behavioral:  Negative for agitation, self-injury, sleep disturbance and suicidal ideas. The patient is not nervous/anxious.       Objective    BP 114/76 (BP Location: Right Arm, Patient Position: Sitting, Cuff Size: Large)   Pulse 80   Resp 16   Ht '5\' 6"'$  (1.676 m)   Wt 297 lb (134.7 kg)   SpO2 97%   BMI 47.94 kg/m     Physical Exam Vitals reviewed.  Constitutional:      Appearance: She is well-developed. She is obese.  HENT:     Head: Normocephalic and atraumatic.     Right Ear: External ear normal.     Left Ear: External ear normal.     Nose: Nose normal.  Eyes:     General: No scleral icterus.    Conjunctiva/sclera: Conjunctivae normal.     Comments: Visual fields intact.  Neck:     Thyroid: No thyromegaly.  Cardiovascular:     Rate and Rhythm: Normal rate and regular rhythm.     Comments: Aortic click Pulmonary:     Effort: Pulmonary effort is normal.     Breath sounds: Normal breath sounds.  Abdominal:     Palpations: Abdomen is soft.  Lymphadenopathy:     Cervical: No cervical adenopathy.  Skin:    General: Skin is warm and dry.  Neurological:     General: No focal deficit present.     Mental Status: She is alert and oriented to person, place, and time.  Psychiatric:        Mood and Affect: Mood normal.        Behavior: Behavior normal.        Thought  Content: Thought content normal.        Judgment: Judgment normal.      No results found for any visits on 01/25/21.  Assessment & Plan     1. Anxiety Continue Lexapro 20 mg daily - escitalopram (LEXAPRO) 20 MG tablet; TAKE 1 TABLET(20 MG) BY MOUTH DAILY  Dispense: 30 tablet; Refill: 11 - CBC w/Diff/Platelet - Comprehensive Metabolic Panel (CMET) - Lipid panel - TSH  2. Obstructive sleep apnea syndrome Refer to neurology/sleep apnea specialist. - CBC w/Diff/Platelet - Comprehensive Metabolic Panel (CMET) - Lipid panel - TSH - Ambulatory referral to Neurology  3. Class 3 severe obesity due to excess calories with serious comorbidity and body mass index (BMI) of 45.0 to 49.9 in adult [facility]) Diet exercise weight loss strongly advised  4. Status post aortic valve replacement On lifelong Coumadin per cardiology  5. Gastroesophageal  reflux disease, unspecified whether esophagitis present   6. Mixed hyperlipidemia  - CBC w/Diff/Platelet - Comprehensive Metabolic Panel (CMET) - Lipid panel - TSH  7. Tinea corporis Start Lotrisone cream and refer to dermatology if it does not resolve. - CBC w/Diff/Platelet - Comprehensive Metabolic Panel (CMET) - Lipid panel - TSH - Ambulatory referral to Dermatology  8. Fatigue, unspecified type  - CBC w/Diff/Platelet - Comprehensive Metabolic Panel (CMET) - Lipid panel - TSH   Return in about 6 months (around 07/28/2021).      I, Wilhemena Durie, MD, have reviewed all documentation for this visit. The documentation on 01/30/21 for the exam, diagnosis, procedures, and orders are all accurate and complete.    Zyir Gassert Cranford Mon, MD  [facility] [phone] (phone) [phone] (fax)  [place]

## 2021-04-29 ENCOUNTER — Other Ambulatory Visit: Payer: Self-pay

## 2021-04-29 ENCOUNTER — Ambulatory Visit
Admission: EM | Admit: 2021-04-29 | Discharge: 2021-04-29 | Disposition: A | Discharge status: Home / Self Care | Payer: 59 | Attending: Emergency Medicine | Admitting: Emergency Medicine

## 2021-04-29 DIAGNOSIS — J0100 Acute maxillary sinusitis, unspecified: Secondary | ICD-10-CM | POA: Diagnosis not present

## 2021-04-29 MED ORDER — AMOXICILLIN-POT CLAVULANATE 875-125 MG PO TABS: 1.0000 | ORAL_TABLET | Freq: Two times a day (BID) | ORAL | 0 refills | Status: AC

## 2021-04-29 NOTE — ED Provider Notes (Signed)
[facility]    CSN: [id number] Arrival date & time: 04/29/21  1509      History   Chief Complaint Chief Complaint  Patient presents with   Nasal Congestion   Facial Pain    HPI Natasha Boyd is a 50 y.o. female.   50 year old female, Natasha Boyd, presents to emergency room chief complaint of nasal congestion and facial pain for a month.  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  Patient has taken over-the-counter meds without improvement.  The history is provided by the patient. No language interpreter was used.   Past Medical History:  Diagnosis Date   Allergic rhinitis    Anginal pain ([place])    Anxiety    Aortic valve stenosis    Benign neoplasm of skin    Dyspnea    GERD (gastroesophageal reflux disease)    Heart murmur    Obesity    Polydipsia    Sleep apnea    uses CPAP at night    Patient Active Problem List   Diagnosis Date Noted   Acute non-recurrent maxillary sinusitis 04/29/2021   Chest pain 11/17/2015   Pneumonia 11/03/2015   Murmur, cardiac 11/03/2015   Adult-onset obesity 03/16/2015   Allergic rhinitis 03/16/2015   Anxiety 03/16/2015   Abnormal liver enzymes 03/16/2015   Acid reflux 03/16/2015   Blood glucose elevated 03/16/2015   Hypertriglyceridemia 03/16/2015   LBP (low back pain) 03/16/2015   Apnea, sleep 03/16/2015   Screening-pulmonary TB 03/16/2015   Immunity status testing 03/16/2015   Flu vaccine need 03/16/2015   ADD (attention deficit disorder) 12/15/2014   Benign neoplasm of skin 02/13/2014    Past Surgical History:  Procedure Laterality Date   ABDOMINAL HYSTERECTOMY  2011   APPENDECTOMY  1996?   CESAREAN SECTION  2007   ESOPHAGOGASTRODUODENOSCOPY (EGD) WITH PROPOFOL N/A 03/03/2016   Procedure: ESOPHAGOGASTRODUODENOSCOPY (EGD) WITH PROPOFOL;  Surgeon: Manya Silvas, MD;  Location[facility]COPY;  Service: Endoscopy;  Laterality: N/A;   RIGHT/LEFT  HEART CATH AND CORONARY ANGIOGRAPHY N/A 04/16/2019   Procedure: RIGHT/LEFT HEART CATH AND CORONARY ANGIOGRAPHY;  Surgeon: Teodoro Spray, MD;  Location: [place] CV LAB;  Service: Cardiovascular;  Laterality: N/A;   TONSILLECTOMY  2010    OB History     Gravida  2   Para  2   Term      Preterm      AB      Living  2      SAB      IAB      Ectopic      Multiple      Live Births           Obstetric Comments  1st Menstrual Cycle: 12  1st Pregnancy: 29          Home Medications    Prior to Admission medications   Medication Sig Start Date End Date Taking? Authorizing Provider  amoxicillin-clavulanate (AUGMENTIN) 875-125 MG tablet Take 1 tablet by mouth every 12 (twelve) hours for 10 days. 04/29/21 97/74/14 Yes Rakesha Dalporto, Jeanett Schlein, NP  clotrimazole-betamethasone (LOTRISONE) cream Apply 1 application topically 2 (two) times daily. 01/25/21  Yes Jerrol Banana., MD  escitalopram (LEXAPRO) 20 MG tablet TAKE 1 TABLET(20 MG) BY MOUTH DAILY 01/25/21  Yes Jerrol Banana., MD  fluticasone [facility]) 50 MCG/ACT nasal spray Place 2 sprays into the nose as needed for allergies.  10/01/14  Yes  [provider]  hydrochlorothiazide (HYDRODIURIL) 25 MG tablet Take 25 mg by mouth daily. 03/17/19  Yes [provider]  lansoprazole (PREVACID) 30 MG capsule Take 1 capsule (30 mg total) by mouth daily at 12 noon. 11/23/15  Yes Margarita Rana, MD  metoprolol tartrate (LOPRESSOR) 25 MG tablet Take 0.5 tablets (12.5 mg total) by mouth 2 (two) times daily. 12/30/18  Yes Jerrol Banana., MD  metoprolol tartrate (LOPRESSOR) 25 MG tablet Take by mouth. 07/01/19  Yes [provider]  warfarin (COUMADIN) 7.5 MG tablet TAKE 1 TABLET(7.5 MG) BY MOUTH EVERY DAY 12/21/20  Yes [provider]  furosemide (LASIX) 40 MG tablet Take by mouth. 07/01/19 01/25/21  [provider]    Family History Family History  Adopted: Yes  Family history  unknown: Yes    Social History Social History   Tobacco Use   Smoking status: Never   Smokeless tobacco: Never  Vaping Use   Vaping Use: Never used  Substance Use Topics   Alcohol use: Yes    Comment: Occasionally   Drug use: No     Allergies   Iodine, Ivp dye [iodinated diagnostic agents], and Mushroom extract complex   Review of Systems Review of Systems  HENT:  Positive for congestion, sinus pressure and sinus pain.   All other systems reviewed and are negative.   Physical Exam Triage Vital Signs ED Triage Vitals  Enc Vitals Group     BP 04/29/21 1728 111/77     Pulse Rate 04/29/21 1728 67     Resp 04/29/21 1728 16     Temp 04/29/21 1728 98.2 F (36.8 C)     Temp Source 04/29/21 1728 Oral     SpO2 --      Weight 04/29/21 1723 280 lb (127 kg)     Height 04/29/21 1723 5\' 6"  (1.676 m)     Head Circumference --      Peak Flow --      Pain Score 04/29/21 1723 4     Pain Loc --      Pain Edu? --      Excl. in [place]? --    No data found.  Updated Vital Signs BP 111/77 (BP Location: Left Arm)   Pulse 67   Temp 98.2 F (36.8 C) (Oral)   Resp 16   Ht 5\' 6"  (1.676 m)   Wt 280 lb (127 kg)   BMI 45.19 kg/m   Visual Acuity Right Eye Distance:   Left Eye Distance:   Bilateral Distance:    Right Eye Near:   Left Eye Near:    Bilateral Near:     Physical Exam Vitals and nursing note reviewed.  Constitutional:      General: She is not in acute distress.    Appearance: She is well-developed and well-groomed.  HENT:     Head: Normocephalic and atraumatic.     Right Ear: Tympanic membrane is retracted.     Left Ear: Tympanic membrane is retracted.     Nose: Congestion present.     Right Sinus: Maxillary sinus tenderness present.     Left Sinus: Maxillary sinus tenderness present.     Mouth/Throat:     Lips: Pink.     Mouth: Mucous membranes are moist.     Pharynx: Oropharynx is clear.     Comments: Thick yellow postnasal drainage noted Eyes:      General: Lids are normal.     Extraocular Movements: Extraocular movements  intact.     Conjunctiva/sclera: Conjunctivae normal.     Pupils: Pupils are equal, round, and reactive to light.  Cardiovascular:     Rate and Rhythm: Normal rate and regular rhythm.     Pulses: Normal pulses.     Heart sounds: Normal heart sounds. No murmur heard. Pulmonary:     Effort: Pulmonary effort is normal. No respiratory distress.     Breath sounds: Normal breath sounds and air entry.  Abdominal:     Palpations: Abdomen is soft.     Tenderness: There is no abdominal tenderness.  Musculoskeletal:     Cervical back: Neck supple.  Skin:    General: Skin is warm and dry.  Neurological:     General: No focal deficit present.     Mental Status: She is alert and oriented to person, place, and time.     GCS: GCS eye subscore is 4. GCS verbal subscore is 5. GCS motor subscore is 6.  Psychiatric:        Attention and Perception: Attention normal.        Mood and Affect: Mood normal.        Speech: Speech normal.        Behavior: Behavior normal. Behavior is cooperative.     UC Treatments / Results  Labs (all labs ordered are listed, but only abnormal results are displayed) Labs Reviewed - No data to display  EKG   Radiology No results found.  Procedures Procedures (including critical care time)  Medications Ordered in UC Medications - No data to display  Initial Impression / Assessment and Plan / UC Course  I have reviewed the triage vital signs and the nursing notes.  Pertinent labs & imaging results that were available during my care of the patient were reviewed by me and considered in my medical decision making (see chart for details).     Ddx: Acute maxillary sinusitis, URI, allergies Final Clinical Impressions(s) / UC Diagnoses   Final diagnoses:  Acute non-recurrent maxillary sinusitis     Discharge Instructions      Rest, push fluids, take antibiotics as directed.  If  symptoms persist or worsen follow-up with ER  or ENT for further evaluation.     ED Prescriptions     Medication Sig Dispense Auth. Provider   amoxicillin-clavulanate (AUGMENTIN) 875-125 MG tablet Take 1 tablet by mouth every 12 (twelve) hours for 10 days. 20 tablet Taylah Dubiel, Jeanett Schlein, NP      PDMP not reviewed this encounter.   Tori Milks, NP 76/22/63 [id number]

## 2021-04-29 NOTE — Discharge Instructions (Signed)
Rest, push fluids, take antibiotics as directed.  If symptoms persist or worsen follow-up with ER  or ENT for further evaluation.

## 2021-04-29 NOTE — ED Triage Notes (Signed)
Pt c/o sinus pain and pressure, cough, colds sxs since late October. Tried mucinex, dayquil, nyquil.

## 2021-05-19 ENCOUNTER — Ambulatory Visit: Payer: 59 | Admitting: Dermatology

## 2021-05-27 ENCOUNTER — Ambulatory Visit
Admission: EM | Admit: 2021-05-27 | Discharge: 2021-05-27 | Disposition: A | Discharge status: Home / Self Care | Payer: 59 | Attending: Physician Assistant | Admitting: Physician Assistant

## 2021-05-27 ENCOUNTER — Other Ambulatory Visit: Payer: Self-pay

## 2021-05-27 ENCOUNTER — Encounter: Payer: Self-pay | Admitting: Emergency Medicine

## 2021-05-27 DIAGNOSIS — R519 Headache, unspecified: Secondary | ICD-10-CM | POA: Insufficient documentation

## 2021-05-27 DIAGNOSIS — Z20822 Contact with and (suspected) exposure to covid-19: Secondary | ICD-10-CM | POA: Diagnosis not present

## 2021-05-27 DIAGNOSIS — J020 Streptococcal pharyngitis: Secondary | ICD-10-CM | POA: Diagnosis not present

## 2021-05-27 DIAGNOSIS — J029 Acute pharyngitis, unspecified: Secondary | ICD-10-CM

## 2021-05-27 LAB — GROUP A STREP BY PCR: Group A Strep by PCR: NOT DETECTED

## 2021-05-27 LAB — RESP PANEL BY RT-PCR (FLU A&B, COVID) ARPGX2
Influenza A by PCR: NEGATIVE
Influenza B by PCR: NEGATIVE
SARS Coronavirus 2 by RT PCR: NEGATIVE

## 2021-05-27 MED ORDER — CEFDINIR 300 MG PO CAPS: 300.0000 mg | ORAL_CAPSULE | Freq: Two times a day (BID) | ORAL | 0 refills | Status: AC

## 2021-05-27 NOTE — Discharge Instructions (Addendum)
-  You are negative for strep, COVID and flu, but still concerned about strep given your significant sore throat and multiple exposures.  I have sent in cefdinir to cover this since you had Augmentin last month. - Increase rest and fluids. - Take over-the-counter Tylenol or Motrin as needed for discomfort. - Follow-up as needed.

## 2021-05-27 NOTE — ED Triage Notes (Signed)
Patient c/o sore throat, weakness, and HA that started earlier this week.  Patient denies fevers.  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

## 2021-05-27 NOTE — ED Provider Notes (Signed)
[facility]    CSN: [id number] Arrival date & time: 05/27/21  1305      History   Chief Complaint Chief Complaint  Patient presents with   Sore Throat    HPI Natasha Boyd is a 50 y.o. female presenting for 4 to 5-day history of severe sore throat, fatigue, body aches and headaches.  She denies fever, cough, congestion, breathing difficulty, vomiting or diarrhea.  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  She was treated for sinusitis 1 month ago with Augmentin.  Patient reports completing full course which was 10 days.  Recently she has not been taking any medication for symptoms.  No other complaints.  HPI  Past Medical History:  Diagnosis Date   Allergic rhinitis    Anginal pain ([place])    Anxiety    Aortic valve stenosis    Benign neoplasm of skin    Dyspnea    GERD (gastroesophageal reflux disease)    Heart murmur    Obesity    Polydipsia    Sleep apnea    uses CPAP at night    Patient Active Problem List   Diagnosis Date Noted   Acute non-recurrent maxillary sinusitis 04/29/2021   Chest pain 11/17/2015   Pneumonia 11/03/2015   Murmur, cardiac 11/03/2015   Adult-onset obesity 03/16/2015   Allergic rhinitis 03/16/2015   Anxiety 03/16/2015   Abnormal liver enzymes 03/16/2015   Acid reflux 03/16/2015   Blood glucose elevated 03/16/2015   Hypertriglyceridemia 03/16/2015   LBP (low back pain) 03/16/2015   Apnea, sleep 03/16/2015   Screening-pulmonary TB 03/16/2015   Immunity status testing 03/16/2015   Flu vaccine need 03/16/2015   ADD (attention deficit disorder) 12/15/2014   Benign neoplasm of skin 02/13/2014    Past Surgical History:  Procedure Laterality Date   ABDOMINAL HYSTERECTOMY  2011   APPENDECTOMY  1996?   CESAREAN SECTION  2007   ESOPHAGOGASTRODUODENOSCOPY (EGD) WITH PROPOFOL N/A 03/03/2016   Procedure: ESOPHAGOGASTRODUODENOSCOPY (EGD) WITH  PROPOFOL;  Surgeon: Manya Silvas, MD;  Location: [facility] ENDOSCOPY;  Service: Endoscopy;  Laterality: N/A;   RIGHT/LEFT HEART CATH AND CORONARY ANGIOGRAPHY N/A 04/16/2019   Procedure: RIGHT/LEFT HEART CATH AND CORONARY ANGIOGRAPHY;  Surgeon: Teodoro Spray, MD;  Location: [place] [facility];  Service: Cardiovascular;  Laterality: N/A;   TONSILLECTOMY  2010    OB History     Gravida  2   Para  2   Term      Preterm      AB      Living  2      SAB      IAB      Ectopic      Multiple      Live Births           Obstetric Comments  1st Menstrual Cycle: 12  1st Pregnancy: 29          Home Medications    Prior to Admission medications   Medication Sig Start Date End Date Taking? Authorizing Provider  cefdinir (OMNICEF) 300 MG capsule Take 1 capsule (300 mg total) by mouth 2 (two) times daily for 10 days. 05/27/21 06/06/21 Yes Danton Clap, PA-C  clotrimazole-betamethasone (LOTRISONE) cream Apply 1 application topically 2 (two) times daily. 01/25/21   Jerrol Banana., MD  escitalopram (LEXAPRO) 20 MG tablet TAKE 1 TABLET(20 MG) BY MOUTH DAILY 01/25/21  Jerrol Banana., MD  fluticasone [facility]) 50 MCG/ACT nasal spray Place 2 sprays into the nose as needed for allergies.  10/01/14   [provider]  furosemide (LASIX) 40 MG tablet Take by mouth. 07/01/19 01/25/21  [provider]  hydrochlorothiazide (HYDRODIURIL) 25 MG tablet Take 25 mg by mouth daily. 03/17/19   [provider]  lansoprazole (PREVACID) 30 MG capsule Take 1 capsule (30 mg total) by mouth daily at 12 noon. 11/23/15   Margarita Rana, MD  metoprolol tartrate (LOPRESSOR) 25 MG tablet Take 0.5 tablets (12.5 mg total) by mouth 2 (two) times daily. 12/30/18   Jerrol Banana., MD  metoprolol tartrate (LOPRESSOR) 25 MG tablet Take by mouth. 07/01/19   [provider]  warfarin (COUMADIN) 7.5 MG tablet TAKE 1 TABLET(7.5 MG) BY MOUTH EVERY DAY 12/21/20   [provider]    Family History Family History  Adopted: Yes  Family history unknown: Yes    Social History Social History   Tobacco Use   Smoking status: Never   Smokeless tobacco: Never  Vaping Use   Vaping Use: Never used  Substance Use Topics   Alcohol use: Yes    Comment: Occasionally   Drug use: No     Allergies   Iodine, Ivp dye [iodinated diagnostic agents], and Mushroom extract complex   Review of Systems Review of Systems  Constitutional:  Positive for fatigue. Negative for chills, diaphoresis and fever.  HENT:  Positive for sore throat. Negative for congestion, ear pain, rhinorrhea, sinus pressure and sinus pain.   Respiratory:  Negative for cough and shortness of breath.   Gastrointestinal:  Negative for abdominal pain, nausea and vomiting.  Musculoskeletal:  Positive for myalgias. Negative for arthralgias.  Skin:  Negative for rash.  Neurological:  Positive for weakness and headaches.  Hematological:  Negative for adenopathy.    Physical Exam Triage Vital Signs ED Triage Vitals  Enc Vitals Group     BP 05/27/21 1427 111/71     Pulse Rate 05/27/21 1427 67     Resp 05/27/21 1427 14     Temp 05/27/21 1427 98 F (36.7 C)     Temp Source 05/27/21 1427 Oral     SpO2 05/27/21 1427 99 %     Weight 05/27/21 1423 280 lb (127 kg)     Height 05/27/21 1423 5\' 6"  (1.676 m)     Head Circumference --      Peak Flow --      Pain Score 05/27/21 1423 3     Pain Loc --      Pain Edu? --      Excl. in [place]? --    No data found.  Updated Vital Signs BP 111/71 (BP Location: Right Arm)   Pulse 67   Temp 98 F (36.7 C) (Oral)   Resp 14   Ht 5\' 6"  (1.676 m)   Wt 280 lb (127 kg)   SpO2 99%   BMI 45.19 kg/m      Physical Exam Vitals and nursing note reviewed.  Constitutional:      General: She is not in acute distress.    Appearance: Normal appearance. She is well-developed. She is not ill-appearing or toxic-appearing.  HENT:     Head: Normocephalic  and atraumatic.     Nose: Nose normal.     Mouth/Throat:     Mouth: Mucous membranes are moist.     Pharynx: Oropharynx is clear. Posterior oropharyngeal erythema present.  Eyes:     General: No scleral icterus.       Right eye: No discharge.        Left eye: No discharge.     Conjunctiva/sclera: Conjunctivae normal.  Cardiovascular:     Rate and Rhythm: Normal rate and regular rhythm.     Heart sounds: Normal heart sounds.  Pulmonary:     Effort: Pulmonary effort is normal. No respiratory distress.     Breath sounds: Normal breath sounds.  Musculoskeletal:     Cervical back: Neck supple.  Skin:    General: Skin is dry.  Neurological:     General: No focal deficit present.     Mental Status: She is alert. Mental status is at baseline.     Motor: No weakness.     Gait: Gait normal.  Psychiatric:        Mood and Affect: Mood normal.        Behavior: Behavior normal.        Thought Content: Thought content normal.     UC Treatments / Results  Labs (all labs ordered are listed, but only abnormal results are displayed) Labs Reviewed  GROUP A STREP BY PCR  RESP PANEL BY RT-PCR (FLU A&B, COVID) [id number]    EKG   Radiology No results found.  Procedures Procedures (including critical care time)  Medications Ordered in UC Medications - No data to display  Initial Impression / Assessment and Plan / UC Course  I have reviewed the triage vital signs and the nursing notes.  Pertinent labs & imaging results that were available during my care of the patient were reviewed by me and considered in my medical decision making (see chart for details).  50 year old female presenting for 4 to 5-day history of severe sore throat, fatigue, body aches.  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  Patient works as a Pharmacist, hospital.  Vitals all stable.  She is overall well-appearing.  She does have erythema of posterior pharynx.  Status post tonsillectomy.  Remainder of exam is  normal.  Respiratory panel all negative.  PCR strep negative.  Discussed all results with patient.  She is still concerned about possibility of strep.  Given her exposures and complaint of severe sore throat, will treat as if she could have had a false negative strep test.  Treating with cefdinir since she had Augmentin last month.  Supportive care.  Reviewed return and ER precautions.  Note for work given.   Final Clinical Impressions(s) / UC Diagnoses   Final diagnoses:  Strep pharyngitis  Sore throat  Nonintractable headache, unspecified chronicity pattern, unspecified headache type     Discharge Instructions      -You are negative for strep, COVID and flu, but still concerned about strep given your significant sore throat and multiple exposures.  I have sent in cefdinir to cover this since you had Augmentin last month. - Increase rest and fluids. - Take over-the-counter Tylenol or Motrin as needed for discomfort. - Follow-up as needed.     ED Prescriptions     Medication Sig Dispense Auth. Provider   cefdinir (OMNICEF) 300 MG capsule Take 1 capsule (300 mg total) by mouth 2 (two) times daily for 10 days. 20 capsule Danton Clap, PA-C      PDMP not reviewed this encounter.   Danton Clap, PA-C 05/27/21 1531

## 2021-08-08 NOTE — Progress Notes (Unsigned)
Complete physical exam   Patient: Natasha Boyd   DOB: 1970-11-16   51 y.o. Female  MRN: [id number] Visit Date: 08/09/2021  Today's healthcare provider: Wilhemena Durie, MD   No chief complaint on file.  Subjective    Natasha Boyd is a 50 y.o. female who presents today for a complete physical exam.  She reports consuming a {diet types:17450} diet. {Exercise:19826} She generally feels {well/fairly well/poorly:18703}. She reports sleeping {well/fairly well/poorly:18703}. She {does/does not:200015} have additional problems to discuss today.  HPI  ***  Past Medical History:  Diagnosis Date   Allergic rhinitis    Anginal pain (HCC)    Anxiety    Aortic valve stenosis    Benign neoplasm of skin    Dyspnea    GERD (gastroesophageal reflux disease)    Heart murmur    Obesity    Polydipsia    Sleep apnea    uses CPAP at night   Past Surgical History:  Procedure Laterality Date   ABDOMINAL HYSTERECTOMY  2011   APPENDECTOMY  1996?   CESAREAN SECTION  2007   ESOPHAGOGASTRODUODENOSCOPY (EGD) WITH PROPOFOL N/A 03/03/2016   Procedure: ESOPHAGOGASTRODUODENOSCOPY (EGD) WITH PROPOFOL;  Surgeon: Manya Silvas, MD;  Location: [facility] ENDOSCOPY;  Service: Endoscopy;  Laterality: N/A;   RIGHT/LEFT HEART CATH AND CORONARY ANGIOGRAPHY N/A 04/16/2019   Procedure: RIGHT/LEFT HEART CATH AND CORONARY ANGIOGRAPHY;  Surgeon: Teodoro Spray, MD;  Location: [place] [facility];  Service: Cardiovascular;  Laterality: N/A;   TONSILLECTOMY  2010   Social History   Socioeconomic History   Marital status: Married    Spouse name: Not on file   Number of children: Not on file   Years of education: Not on file   Highest education level: Not on file  Occupational History   Not on file  Tobacco Use   Smoking status: Never   Smokeless tobacco: Never  Vaping Use   Vaping Use: Never used  Substance and Sexual Activity   Alcohol use: Yes    Comment: Occasionally   Drug use: No    Sexual activity: Yes    Birth control/protection: None  Other Topics Concern   Not on file  Social History Narrative   Not on file   Social Determinants of Health   Financial Resource Strain: Not on file  Food Insecurity: Not on file  Transportation Needs: Not on file  Physical Activity: Not on file  Stress: Not on file  Social Connections: Not on file  Intimate Partner Violence: Not on file   No family status information on file.   Family History  Adopted: Yes  Family history unknown: Yes   Allergies  Allergen Reactions   Iodine Rash    Anaphylaxis if injected.    Ivp Dye [Iodinated Contrast Media] Anaphylaxis   Mushroom Extract Complex Anaphylaxis    Patient Care Team: Jerrol Banana., MD as PCP - General (Family Medicine) Christene Lye, MD (General Surgery) Ammie Dalton, Okey Regal, MD (Unknown Physician Specialty)   Medications: Outpatient Medications Prior to Visit  Medication Sig   clotrimazole-betamethasone (LOTRISONE) cream Apply 1 application topically 2 (two) times daily.   escitalopram (LEXAPRO) 20 MG tablet TAKE 1 TABLET(20 MG) BY MOUTH DAILY   fluticasone (FLONASE) 50 MCG/ACT nasal spray Place 2 sprays into the nose as needed for allergies.    furosemide (LASIX) 40 MG tablet Take by mouth.   hydrochlorothiazide (HYDRODIURIL) 25 MG tablet Take 25 mg by mouth  daily.   lansoprazole (PREVACID) 30 MG capsule Take 1 capsule (30 mg total) by mouth daily at 12 noon.   metoprolol tartrate (LOPRESSOR) 25 MG tablet Take 0.5 tablets (12.5 mg total) by mouth 2 (two) times daily.   metoprolol tartrate (LOPRESSOR) 25 MG tablet Take by mouth.   warfarin (COUMADIN) 7.5 MG tablet TAKE 1 TABLET(7.5 MG) BY MOUTH EVERY DAY   Facility-Administered Medications Prior to Visit  Medication Dose Route Frequency Provider   sodium chloride flush (NS) 0.9 % injection 3 mL  3 mL Intravenous Q12H Fath, Javier Docker, MD    Review of Systems  All other systems reviewed and  are negative.  {Labs   Heme   Chem   Endocrine   Serology   Results Review (optional):23779}  Objective    There were no vitals taken for this visit. {Show previous vital signs (optional):23777}  Physical Exam  ***  Last depression screening scores PHQ 2/9 Scores 07/18/2019 01/06/2019 12/06/2017  PHQ - 2 Score 0 1 1  PHQ- 9 Score 4 2 10    Last fall risk screening Fall Risk  07/16/2019  Falls in the past year? 0  Number falls in past yr: 0  Injury with Fall? -  Risk for fall due to : No Fall Risks   Last Audit-C alcohol use screening Alcohol Use Disorder Test (AUDIT) 01/06/2019  1. How often do you have a drink containing alcohol? 4  2. How many drinks containing alcohol do you have on a typical day when you are drinking? 0  3. How often do you have six or more drinks on one occasion? 0  AUDIT-C Score 4  4. How often during the last year have you found that you were not able to stop drinking once you had started? 0  5. How often during the last year have you failed to do what was normally expected from you because of drinking? 0  6. How often during the last year have you needed a first drink in the morning to get yourself going after a heavy drinking session? 0  7. How often during the last year have you had a feeling of guilt of remorse after drinking? 0  8. How often during the last year have you been unable to remember what happened the night before because you had been drinking? 0  9. Have you or someone else been injured as a result of your drinking? 0  10. Has a relative or friend or a doctor or another health worker been concerned about your drinking or suggested you cut down? 0  Alcohol Use Disorder Identification Test Final Score (AUDIT) 4  Alcohol Brief Interventions/Follow-up AUDIT Score <7 follow-up not indicated   A score of 3 or more in women, and 4 or more in men indicates increased risk for alcohol abuse, EXCEPT if all of the points are from question 1   No results  found for any visits on 08/09/21.  Assessment & Plan    Routine Health Maintenance and Physical Exam  Exercise Activities and Dietary recommendations  Goals   None     Immunization History  Administered Date(s) Administered   Influenza Nasal 04/28/2010   Influenza, High Dose Seasonal PF 04/21/2020   Influenza,inj,Quad PF,6+ Mos 03/16/2015   Influenza-Unspecified 03/21/2018   Moderna Sars-Covid-2 Vaccination 08/23/2019, 09/20/2019   Tdap 03/16/2015    Health Maintenance  Topic Date Due   HIV Screening  Never done   PAP SMEAR-Modifier  Never done  COLONOSCOPY (Pts 45-54yrs Insurance coverage will need to be confirmed)  Never done   COVID-19 Vaccine (3 - Booster for Moderna series) 11/15/2019   Zoster Vaccines- Shingrix (1 of 2) Never done   MAMMOGRAM  01/02/2021   INFLUENZA VACCINE  01/17/2021   TETANUS/TDAP  03/15/2025   Hepatitis C Screening  Completed   HPV VACCINES  Aged Out    Discussed health benefits of physical activity, and encouraged her to engage in regular exercise appropriate for her age and condition.  ***  No follow-ups on file.     {provider attestation***:1}   Wilhemena Durie, MD  [facility] [phone] (phone) [phone] (fax)  [place]

## 2021-08-09 ENCOUNTER — Encounter: Payer: Self-pay | Admitting: Family Medicine

## 2021-08-09 DIAGNOSIS — E782 Mixed hyperlipidemia: Secondary | ICD-10-CM

## 2021-08-09 DIAGNOSIS — Z0000 Encounter for general adult medical examination without abnormal findings: Secondary | ICD-10-CM

## 2021-08-09 DIAGNOSIS — G4733 Obstructive sleep apnea (adult) (pediatric): Secondary | ICD-10-CM

## 2021-08-09 DIAGNOSIS — R739 Hyperglycemia, unspecified: Secondary | ICD-10-CM

## 2021-08-09 DIAGNOSIS — E669 Obesity, unspecified: Secondary | ICD-10-CM

## 2021-09-20 ENCOUNTER — Other Ambulatory Visit: Payer: Self-pay

## 2021-09-20 ENCOUNTER — Ambulatory Visit
Admission: EM | Admit: 2021-09-20 | Discharge: 2021-09-20 | Disposition: A | Discharge status: Home / Self Care | Payer: 59 | Attending: Internal Medicine | Admitting: Internal Medicine

## 2021-09-20 DIAGNOSIS — J019 Acute sinusitis, unspecified: Secondary | ICD-10-CM | POA: Diagnosis not present

## 2021-09-20 MED ORDER — AMOXICILLIN-POT CLAVULANATE 875-125 MG PO TABS
1.0000 | ORAL_TABLET | Freq: Two times a day (BID) | ORAL | 0 refills | Status: AC
Start: 2021-09-20 — End: 2021-09-30

## 2021-09-20 MED ORDER — BENZONATATE 200 MG PO CAPS: 200.0000 mg | ORAL_CAPSULE | Freq: Three times a day (TID) | ORAL | 0 refills | Status: DC | PRN

## 2021-09-20 MED ORDER — PREDNISONE 50 MG PO TABS: 50.0000 mg | ORAL_TABLET | Freq: Every day | ORAL | 0 refills | Status: DC

## 2021-09-20 NOTE — Discharge Instructions (Signed)
Symptoms and exam today are consistent with a sinus infection.  Prescriptions for benzonatate (for cough), prednisone (for cough/congestion) and for amoxicillin/clavulanate (antibiotic) were sent to the pharmacy.  Push fluids and rest.  Take tylenol or advil otc as needed for fever, discomfort.  Eat fruits and vegetables to help your immune system do its best work.  Anticipate gradual improvement over the next several days.  Recheck for new fever >100.5, increasing phlegm production/nasal discharge, or if not starting to improve in a few days.    ?

## 2021-09-20 NOTE — ED Triage Notes (Signed)
Pt c/o nasal congestion, nasal draiange, cough x2-3weeks ? ?Pt is worried about a possible sinus infection ?

## 2021-09-20 NOTE — ED Provider Notes (Signed)
?[place] ? ? ? ?CSN: [id number] ?Arrival date & time: 09/20/21  1036 ? ? ?  ? ?History   ?Chief Complaint ?Chief Complaint  ?Patient presents with  ? Chest Pain  ? Nasal Congestion  ? Headache  ? ? ?HPI ?Natasha Boyd is a 51 y.o. female.  She presents today with 'allergy symptoms,' runny/congested nose, some cough, present for 2 to 3 weeks.   ? ?Has taken a turn for the worse in the last several days, with headache, tactile temperature, malaise.  Marked increase in coughing, runny/congested nose.  Feels like she might be wheezing sometimes.  Some central chest discomfort with coughing.  Feels terrible.  Teacher. ? ? ?Chest Pain ?Associated symptoms: headache   ?Headache ? ?Past Medical History:  ?Diagnosis Date  ? Allergic rhinitis   ? Anginal pain ([place])   ? Anxiety   ? Aortic valve stenosis   ? Benign neoplasm of skin   ? Dyspnea   ? GERD (gastroesophageal reflux disease)   ? Heart murmur   ? Obesity   ? Polydipsia   ? Sleep apnea   ? uses CPAP at night  ? ? ?Patient Active Problem List  ? Diagnosis Date Noted  ? Acute non-recurrent maxillary sinusitis 04/29/2021  ? Chest pain 11/17/2015  ? Pneumonia 11/03/2015  ? Murmur, cardiac 11/03/2015  ? Adult-onset obesity 03/16/2015  ? Allergic rhinitis 03/16/2015  ? Anxiety 03/16/2015  ? Abnormal liver enzymes 03/16/2015  ? Acid reflux 03/16/2015  ? Blood glucose elevated 03/16/2015  ? Hypertriglyceridemia 03/16/2015  ? LBP (low back pain) 03/16/2015  ? Apnea, sleep 03/16/2015  ? Screening-pulmonary TB 03/16/2015  ? Immunity status testing 03/16/2015  ? Flu vaccine need 03/16/2015  ? ADD (attention deficit disorder) 12/15/2014  ? Benign neoplasm of skin 02/13/2014  ? ? ?Past Surgical History:  ?Procedure Laterality Date  ? ABDOMINAL HYSTERECTOMY  2011  ? APPENDECTOMY  1996?  ? CESAREAN SECTION  2007  ? ESOPHAGOGASTRODUODENOSCOPY (EGD) WITH PROPOFOL N/A 03/03/2016  ? Procedure: ESOPHAGOGASTRODUODENOSCOPY (EGD) WITH PROPOFOL;  Surgeon: Manya Silvas, MD;   Location: [facility] ENDOSCOPY;  Service: Endoscopy;  Laterality: N/A;  ? RIGHT/LEFT HEART CATH AND CORONARY ANGIOGRAPHY N/A 04/16/2019  ? Procedure: RIGHT/LEFT HEART CATH AND CORONARY ANGIOGRAPHY;  Surgeon: Teodoro Spray, MD;  Location: [facility];  Service: Cardiovascular;  Laterality: N/A;  ? TONSILLECTOMY  2010  ? ? ?OB History   ? ? Gravida  ?2  ? Para  ?2  ? Term  ?   ? Preterm  ?   ? AB  ?   ? Living  ?2  ?  ? ? SAB  ?   ? IAB  ?   ? Ectopic  ?   ? Multiple  ?   ? Live Births  ?   ?   ?  ? Obstetric Comments  ?1st Menstrual Cycle: 12  ?1st Pregnancy: 29  ?  ? ?  ? ? ? ?Home Medications   ? ?Prior to Admission medications   ?Medication Sig Start Date End Date Taking? Authorizing Provider  ?amoxicillin-clavulanate (AUGMENTIN) 875-125 MG tablet Take 1 tablet by mouth 2 (two) times daily for 10 days. 09/20/21 09/30/21 Yes Wynona Luna, MD  ?benzonatate (TESSALON) 200 MG capsule Take 1 capsule (200 mg total) by mouth 3 (three) times daily as needed for cough. 09/20/21  Yes Wynona Luna, MD  ?escitalopram (LEXAPRO) 20 MG tablet TAKE 1 TABLET(20 MG) BY MOUTH DAILY 01/25/21  Yes Jerrol Banana., MD  ?fluticasone [facility]) 50 MCG/ACT nasal spray Place 2 sprays into the nose as needed for allergies.  10/01/14  Yes [provider]  ?metoprolol tartrate (LOPRESSOR) 25 MG tablet Take 0.5 tablets (12.5 mg total) by mouth 2 (two) times daily. 12/30/18  Yes Jerrol Banana., MD  ?predniSONE (DELTASONE) 50 MG tablet Take 1 tablet (50 mg total) by mouth daily. 09/20/21  Yes Wynona Luna, MD  ?warfarin (COUMADIN) 7.5 MG tablet TAKE 1 TABLET(7.5 MG) BY MOUTH EVERY DAY 12/21/20  Yes [provider]  ?furosemide (LASIX) 40 MG tablet Take by mouth. 07/01/19 01/25/21  [provider]  ? ? ?Family History ?Family History  ?Adopted: Yes  ?Family history unknown: Yes  ? ? ?Social History ?Social History  ? ?Tobacco Use  ? Smoking status: Never  ? Smokeless tobacco: Never  ?Vaping  Use  ? Vaping Use: Never used  ?Substance Use Topics  ? Alcohol use: Yes  ?  Comment: Occasionally  ? Drug use: No  ? ? ? ?Allergies   ?Iodine, Ivp dye [iodinated contrast media], Mushroom extract complex, and Sulfa antibiotics ? ? ?Review of Systems ?Review of Systems  ?Cardiovascular:  Positive for chest pain.  ?Neurological:  Positive for headaches.   See HPI ? ? ?Physical Exam ?Triage Vital Signs ?ED Triage Vitals  ?Enc Vitals Group  ?   BP 09/20/21 1239 116/83  ?   Pulse Rate 09/20/21 1239 86  ?   Resp 09/20/21 1239 18  ?   Temp 09/20/21 1239 98.4 ?F (36.9 ?C)  ?   Temp Source 09/20/21 1239 Oral  ?   SpO2 09/20/21 1239 99 %  ?   Weight 09/20/21 1236 280 lb (127 kg)  ?   Height 09/20/21 1236 '5\' 6"'$  (1.676 m)  ?   Pain Score 09/20/21 1236 3  ?   Pain Loc --   ? ? ?Updated Vital Signs ?BP 116/83 (BP Location: Left Arm)   Pulse 86   Temp 98.4 ?F (36.9 ?C) (Oral)   Resp 18   Ht '5\' 6"'$  (1.676 m)   Wt 127 kg   SpO2 99%   BMI 45.19 kg/m?  ? ?Physical Exam ?Constitutional:   ?   General: She is not in acute distress. ?   Appearance: She is ill-appearing. She is not toxic-appearing.  ?   Comments: Good hygiene ?Voice sounds very congested  ?HENT:  ?   Head:  ?   Comments: Bilateral TMs are opaque, no erythema ?Marked nasal congestion bilaterally ?Posterior pharynx is pink ?   Mouth/Throat:  ?   Mouth: Mucous membranes are moist.  ?Eyes:  ?   Conjunctiva/sclera:  ?   Right eye: Right conjunctiva is not injected. No exudate. ?   Left eye: Left conjunctiva is not injected. No exudate. ?   Comments:   Conjugate gaze observed   ?Cardiovascular:  ?   Rate and Rhythm: Normal rate and regular rhythm.  ?Pulmonary:  ?   Effort: Pulmonary effort is normal. No respiratory distress.  ?   Breath sounds: No wheezing or rhonchi.  ?   Comments: Faint coarseness throughout, nonfocal ?Abdominal:  ?   General: There is no distension.  ?Musculoskeletal:  ?   Cervical back: Neck supple.  ?   Comments: Walked into the urgent care  independently  ?Skin: ?   General: Skin is warm and dry.  ?   Comments: Pink, no cyanosis  ?Neurological:  ?  Mental Status: She is alert.  ?   Comments: Face is symmetric, speech is clear, coherent, logical  ? ? ? ?UC Treatments / Results  ?Labs ?(all labs ordered are listed, but only abnormal results are displayed) ?Labs Reviewed - No data to display ?NA ? ?EKG ?NA ? ?Radiology ?No results found. ?NA ? ?Procedures ?Procedures (including critical care time) ?NA ? ?Medications Ordered in UC ?Medications - No data to display ?NA ? ?Final Clinical Impressions(s) / UC Diagnoses  ? ?Final diagnoses:  ?Acute sinusitis with symptoms > 10 days  ? ? ? ?Discharge Instructions   ? ?  ?Symptoms and exam today are consistent with a sinus infection.  Prescriptions for benzonatate (for cough), prednisone (for cough/congestion) and for amoxicillin/clavulanate (antibiotic) were sent to the pharmacy.  Push fluids and rest.  Take tylenol or advil otc as needed for fever, discomfort.  Eat fruits and vegetables to help your immune system do its best work.  Anticipate gradual improvement over the next several days.  Recheck for new fever >100.5, increasing phlegm production/nasal discharge, or if not starting to improve in a few days.    ? ? ?ED Prescriptions   ? ? Medication Sig Dispense Auth. Provider  ? predniSONE (DELTASONE) 50 MG tablet Take 1 tablet (50 mg total) by mouth daily. 3 tablet Wynona Luna, MD  ? benzonatate (TESSALON) 200 MG capsule Take 1 capsule (200 mg total) by mouth 3 (three) times daily as needed for cough. 30 capsule Wynona Luna, MD  ? amoxicillin-clavulanate (AUGMENTIN) 875-125 MG tablet Take 1 tablet by mouth 2 (two) times daily for 10 days. 20 tablet Wynona Luna, MD  ? ?  ? ?PDMP not reviewed this encounter. ?  ?Wynona Luna, MD ?09/21/21 1152 ? ?

## 2022-01-18 ENCOUNTER — Other Ambulatory Visit: Payer: Self-pay | Admitting: Family Medicine

## 2022-01-18 DIAGNOSIS — F419 Anxiety disorder, unspecified: Secondary | ICD-10-CM

## 2022-02-10 ENCOUNTER — Ambulatory Visit: Payer: Self-pay

## 2022-02-10 NOTE — Telephone Encounter (Signed)
  Chief Complaint: moderate abdominal swelling and mild ankle swelling occurring more often Symptoms: before diuretic had SOB now is resolved, gassy, more flatulence, mild ankle swelling Frequency: over the summer has  worsened Pertinent Negatives: Patient denies pain, blood in stool, constipation or diarrhea, fever, vomiting, weight loss or sclera yellowing Disposition: '[]'$ ED /'[]'$ Urgent Care (no appt availability in office) / '[x]'$ Appointment(In office/virtual)/ '[]'$  [place] [facility]/ '[]'$ Home Care/ '[]'$ Refused Recommended Disposition /'[]'$ [place] [facility]/ '[]'$  Follow-up with PCP Additional Notes: Disposition recommended U/C. Do not think necessary. Pt took diuretic and swelling has decreased and is no longer SOB= pt wants to evaluate if liver disease- please ask provider to review. Change Mondays appt to morning slot. Reason for Disposition  [1] MODERATE-SEVERE SWELLING of abdomen (e.g., looks very distended or swollen) AND [2] NEW-onset or much worse  Answer Assessment - Initial Assessment Questions 1. SYMPTOM: "What's the main symptom you're concerned about?" (e.g., abdomen bloating, swelling)     swelling 2. ONSET: "When did swelling  start?"     Over the summer 3. SEVERITY: "How bad is the bloating or swelling?"    - BLOATING: Feels gassy or bloated. No visible swelling.     - MILD SWELLING: Feels gassy or bloated. Abdomen looks mildly distended or swollen.    - MODERATE - SEVERE SWELLING: Abdomen looks very distended or swollen.      Mild more often 4. ABDOMEN PAIN:  "Is there any abdomen pain?" If Yes, ask: "How bad is the pain?"  (e.g., Scale 1-10; mild, moderate, or severe)   - NONE (0): No pain.   - MILD (1-3): Doesn't interfere with normal activities, abdomen soft and not tender to touch.    - MODERATE (4-7): Interferes with normal activities or awakens from sleep, abdomen tender to touch.    - SEVERE (8-10): Excruciating pain, doubled over, unable to do any normal activities.        no 5. RELIEVING AND AGGRAVATING FACTORS: "What makes it better or worse?" (e.g., certain foods, lactose, medicines)     Diuretics better nothing 6. GI HISTORY: "Do you have any history of stomach or intestine problnoems?" (e.g., bowel obstruction, cancer, irritable bowel)      no 7. CAUSE: "What do you think is causing the bloating?"      Related to valve replace 8. OTHER SYMPTOMS: "Do you have any other symptoms?" (e.g., belching, blood in stool, breathing difficulty, constipation, diarrhea, fever, passing gas, vomiting, weight loss, white of eyes have turned yellow)     SOB when present- 4-5 lbs gain that diuretic,  gassy flatulence 9. PREGNANCY: "Is there any chance you are pregnant?" "When was your last menstrual period?"     N/a  Protocols used: Abdomen Bloating and Swelling-A-AH

## 2022-02-10 NOTE — Progress Notes (Deleted)
      Established patient visit   Patient: Natasha Boyd   DOB: [birth year]   51 y.o. Female  MRN: [id number] Visit Date: 02/13/2022  Today's healthcare provider: Wilhemena Durie, MD   No chief complaint on file.  Subjective    HPI  Anxiety, Follow-up  She was last seen for anxiety 1 months ago. Changes made at last visit include; Continue Lexapro 20 mg daily.   GAD-7 Results    01/25/2021    3:38 PM 12/06/2017    9:30 AM  GAD-7 Generalized Anxiety Disorder Screening Tool  1. Feeling Nervous, Anxious, or on Edge 1 2  2. Not Being Able to Stop or Control Worrying 0 2  3. Worrying Too Much About Different Things 1 2  4. Trouble Relaxing 0 3  5. Being So Restless it's Hard To Sit Still 2 1  6. Becoming Easily Annoyed or Irritable 0 1  7. Feeling Afraid As If Something Awful Might Happen 0 0  Total GAD-7 Score 4 11  Difficulty At Work, Home, or Getting  Along With Others? Not difficult at all Somewhat difficult    PHQ-9 Scores    07/18/2019    7:49 AM 01/06/2019    2:24 PM 12/06/2017    9:30 AM  PHQ9 SCORE ONLY  PHQ-9 Total Score '4 2 10    '$ ---------------------------------------------------------------------------------------------------   Medications: Outpatient Medications Prior to Visit  Medication Sig   benzonatate (TESSALON) 200 MG capsule Take 1 capsule (200 mg total) by mouth 3 (three) times daily as needed for cough.   escitalopram (LEXAPRO) 20 MG tablet TAKE 1 TABLET(20 MG) BY MOUTH DAILY   fluticasone (FLONASE) 50 MCG/ACT nasal spray Place 2 sprays into the nose as needed for allergies.    furosemide (LASIX) 40 MG tablet Take by mouth.   metoprolol tartrate (LOPRESSOR) 25 MG tablet Take 0.5 tablets (12.5 mg total) by mouth 2 (two) times daily.   predniSONE (DELTASONE) 50 MG tablet Take 1 tablet (50 mg total) by mouth daily.   warfarin (COUMADIN) 7.5 MG tablet TAKE 1 TABLET(7.5 MG) BY MOUTH EVERY DAY   Facility-Administered Medications Prior to Visit   Medication Dose Route Frequency Provider   sodium chloride flush (NS) 0.9 % injection 3 mL  3 mL Intravenous Q12H Teodoro Spray, MD    Review of Systems  Constitutional:  Negative for appetite change, chills, fatigue and fever.  Respiratory:  Negative for chest tightness and shortness of breath.   Cardiovascular:  Negative for chest pain and palpitations.  Gastrointestinal:  Negative for abdominal pain, nausea and vomiting.  Neurological:  Negative for dizziness and weakness.    {Labs  Heme  Chem  Endocrine  Serology  Results Review (optional):23779}   Objective    There were no vitals taken for this visit. {Show previous vital signs (optional):23777}  Physical Exam  ***  No results found for any visits on 02/13/22.  Assessment & Plan     ***  No follow-ups on file.      {provider attestation***:1}   Wilhemena Durie, MD  [facility] [phone] (phone) [phone] (fax)  [place]

## 2022-02-10 NOTE — Telephone Encounter (Signed)
Patient was advised.  

## 2022-02-10 NOTE — Telephone Encounter (Signed)
Please advise 

## 2022-02-13 ENCOUNTER — Ambulatory Visit
Admission: RE | Admit: 2022-02-13 | Discharge: 2022-02-13 | Disposition: A | Discharge status: Home / Self Care | Payer: 59 | Source: Ambulatory Visit | Attending: Family Medicine | Admitting: Family Medicine

## 2022-02-13 ENCOUNTER — Ambulatory Visit (INDEPENDENT_AMBULATORY_CARE_PROVIDER_SITE_OTHER): Payer: PRIVATE HEALTH INSURANCE | Admitting: Family Medicine

## 2022-02-13 ENCOUNTER — Ambulatory Visit: Payer: PRIVATE HEALTH INSURANCE | Admitting: Family Medicine

## 2022-02-13 ENCOUNTER — Ambulatory Visit
Admission: RE | Admit: 2022-02-13 | Discharge: 2022-02-13 | Disposition: A | Discharge status: Home / Self Care | Payer: 59 | Attending: Family Medicine | Admitting: Family Medicine

## 2022-02-13 ENCOUNTER — Encounter: Payer: Self-pay | Admitting: Family Medicine

## 2022-02-13 VITALS — BP 119/82 | HR 70 | Temp 97.8°F | Resp 17 | Ht 66.0 in | Wt 290.0 lb

## 2022-02-13 DIAGNOSIS — Z6841 Body Mass Index (BMI) 40.0 and over, adult: Secondary | ICD-10-CM

## 2022-02-13 DIAGNOSIS — Z952 Presence of prosthetic heart valve: Secondary | ICD-10-CM | POA: Diagnosis not present

## 2022-02-13 DIAGNOSIS — R140 Abdominal distension (gaseous): Secondary | ICD-10-CM | POA: Diagnosis not present

## 2022-02-13 DIAGNOSIS — I509 Heart failure, unspecified: Secondary | ICD-10-CM

## 2022-02-13 NOTE — Patient Instructions (Signed)
Take 2 lasix for 3 days.

## 2022-02-13 NOTE — Progress Notes (Signed)
I,Tiffany J Bragg,acting as a scribe for Wilhemena Durie, MD.,have documented all relevant documentation on the behalf of Wilhemena Durie, MD,as directed by  Wilhemena Durie, MD while in the presence of Wilhemena Durie, MD.  Established patient visit   Patient: Natasha Boyd   DOB: 10-01-1970   51 y.o. Female  MRN: [id number] Visit Date: 02/13/2022  Today's healthcare provider: Wilhemena Durie, MD   Chief Complaint  Patient presents with   [facility]    Patient complains of swelling and discomfort in her abdomen about a week ago. Not resolving with lasix.    Subjective    HPI Patient states that for the past 1-1/2 weeks she is been having abdominal bloating which is usually helped by taking the diuretic.  Also she usually has lower extremity edema with his bloating but has none now.  The last complaint is that she cannot hear her aortic valve click now.  Normally she can hear a click. He has no pain in her abdomen or GI or GU symptoms. HPI     Bloated    Additional comments: Patient complains of swelling and discomfort in her abdomen about a week ago. Not resolving with lasix.       Last edited by Smitty Knudsen, CMA on 02/13/2022  8:15 AM.        Medications: Outpatient Medications Prior to Visit  Medication Sig   benzonatate (TESSALON) 200 MG capsule Take 1 capsule (200 mg total) by mouth 3 (three) times daily as needed for cough.   escitalopram (LEXAPRO) 20 MG tablet TAKE 1 TABLET(20 MG) BY MOUTH DAILY   fluticasone (FLONASE) 50 MCG/ACT nasal spray Place 2 sprays into the nose as needed for allergies.    metoprolol tartrate (LOPRESSOR) 25 MG tablet Take 0.5 tablets (12.5 mg total) by mouth 2 (two) times daily.   predniSONE (DELTASONE) 50 MG tablet Take 1 tablet (50 mg total) by mouth daily.   warfarin (COUMADIN) 7.5 MG tablet TAKE 1 TABLET(7.5 MG) BY MOUTH EVERY DAY   furosemide (LASIX) 40 MG tablet Take by mouth.   Facility-Administered  Medications Prior to Visit  Medication Dose Route Frequency Provider   sodium chloride flush (NS) 0.9 % injection 3 mL  3 mL Intravenous Q12H Teodoro Spray, MD    Review of Systems  Last hemoglobin A1c Lab Results  Component Value Date   HGBA1C 5.7 08/28/2014       Objective    BP 119/82 (BP Location: Right Arm, Patient Position: Sitting, Cuff Size: Large)   Pulse 70   Temp 97.8 F (36.6 C) (Oral)   Resp 17   Ht '5\' 6"'$  (1.676 m)   Wt 290 lb (131.5 kg)   SpO2 96%   BMI 46.81 kg/m  BP Readings from Last 3 Encounters:  02/13/22 119/82  09/20/21 116/83  05/27/21 111/71   Wt Readings from Last 3 Encounters:  02/13/22 290 lb (131.5 kg)  09/20/21 280 lb (127 kg)  05/27/21 280 lb (127 kg)      Physical Exam Vitals reviewed.  Constitutional:      General: She is not in acute distress.    Appearance: She is well-developed. She is obese.  HENT:     Head: Normocephalic and atraumatic.     Right Ear: Hearing normal.     Left Ear: Hearing normal.     Nose: Nose normal.  Eyes:     General: Lids are normal. No scleral  icterus.       Right eye: No discharge.        Left eye: No discharge.     Conjunctiva/sclera: Conjunctivae normal.  Cardiovascular:     Rate and Rhythm: Normal rate and regular rhythm.     Heart sounds: Normal heart sounds.  Pulmonary:     Effort: Pulmonary effort is normal. No respiratory distress.     Breath sounds: Normal breath sounds.  Abdominal:     Palpations: Abdomen is soft.  Skin:    General: Skin is warm and dry.     Findings: No lesion or rash.  Neurological:     General: No focal deficit present.     Mental Status: She is alert and oriented to person, place, and time.  Psychiatric:        Mood and Affect: Mood normal.        Speech: Speech normal.        Behavior: Behavior normal.        Thought Content: Thought content normal.        Judgment: Judgment normal.       No results found for any visits on 02/13/22.  Assessment &  Plan     1. Bloating Patient has had no lower extremity edema.  I am not sure this is heart failure but ascites is a possibility. He has never been diagnosed with liver problems but at a weight of 290s she could have NASH  - CBC - Comprehensive metabolic panel - TSH - Lipid Profile - US Abdomen Complete; Future  2. Congestive heart failure, unspecified HF chronicity, unspecified heart failure type ([place]) Obtain chest x-ray.  At this time double Lasix for the next few days pending work-up. Discussed going ahead and see Dr. Ubaldo Glassing for follow-up of her valvular heart diseas - DG Chest 2 View  3. Status post aortic valve replacement   4. Class 3 severe obesity due to excess calories with serious comorbidity and body mass index (BMI) of 45.0 to 49.9 in adult [facility]) Diet and exercise stressed.   No follow-ups on file.      I, Wilhemena Durie, MD, have reviewed all documentation for this visit. The documentation on 02/13/22 for the exam, diagnosis, procedures, and orders are all accurate and complete.    Goldy Calandra Cranford Mon, MD  [facility] [phone] (phone) [phone] (fax)  [place]

## 2022-02-14 LAB — COMPREHENSIVE METABOLIC PANEL
ALT: 27 IU/L (ref 0–32)
AST: 23 IU/L (ref 0–40)
Albumin/Globulin Ratio: 1.6 (ref 1.2–2.2)
Albumin: 4.2 g/dL (ref 3.8–4.9)
Alkaline Phosphatase: 83 IU/L (ref 44–121)
BUN/Creatinine Ratio: 15 (ref 9–23)
BUN: 15 mg/dL (ref 6–24)
Bilirubin Total: 0.3 mg/dL (ref 0.0–1.2)
CO2: 25 mmol/L (ref 20–29)
Calcium: 9.4 mg/dL (ref 8.7–10.2)
Chloride: 102 mmol/L (ref 96–106)
Creatinine, Ser: 1.03 mg/dL — ABNORMAL HIGH (ref 0.57–1.00)
Globulin, Total: 2.7 g/dL (ref 1.5–4.5)
Glucose: 94 mg/dL (ref 70–99)
Potassium: 5.1 mmol/L (ref 3.5–5.2)
Sodium: 140 mmol/L (ref 134–144)
Total Protein: 6.9 g/dL (ref 6.0–8.5)
eGFR: 66 mL/min/{1.73_m2} (ref >59)

## 2022-02-14 LAB — CBC
Hematocrit: 45.5 % (ref 34.0–46.6)
Hemoglobin: 15 g/dL (ref 11.1–15.9)
MCH: 29.4 pg (ref 26.6–33.0)
MCHC: 33 g/dL (ref 31.5–35.7)
MCV: 89 fL (ref 79–97)
Platelets: 197 10*3/uL (ref 150–450)
RBC: 5.1 x10E6/uL (ref 3.77–5.28)
RDW: 13.6 % (ref 11.7–15.4)
WBC: 6.7 10*3/uL (ref 3.4–10.8)

## 2022-02-14 LAB — LIPID PANEL
Chol/HDL Ratio: 6.3 ratio — ABNORMAL HIGH (ref 0.0–4.4)
Cholesterol, Total: 238 mg/dL — ABNORMAL HIGH (ref 100–199)
HDL: 38 mg/dL — ABNORMAL LOW (ref >39)
LDL Chol Calc (NIH): 154 mg/dL — ABNORMAL HIGH (ref 0–99)
Triglycerides: 248 mg/dL — ABNORMAL HIGH (ref 0–149)
VLDL Cholesterol Cal: 46 mg/dL — ABNORMAL HIGH (ref 5–40)

## 2022-02-14 LAB — TSH: TSH: 2.13 u[IU]/mL (ref 0.450–4.500)

## 2022-02-16 ENCOUNTER — Ambulatory Visit
Admission: RE | Admit: 2022-02-16 | Discharge: 2022-02-16 | Disposition: A | Discharge status: Home / Self Care | Payer: 59 | Source: Ambulatory Visit | Attending: Family Medicine | Admitting: Family Medicine

## 2022-02-16 DIAGNOSIS — R140 Abdominal distension (gaseous): Secondary | ICD-10-CM

## 2022-02-23 ENCOUNTER — Other Ambulatory Visit: Payer: Self-pay | Admitting: Family Medicine

## 2022-02-23 DIAGNOSIS — F419 Anxiety disorder, unspecified: Secondary | ICD-10-CM

## 2022-04-26 ENCOUNTER — Ambulatory Visit
Admission: EM | Admit: 2022-04-26 | Discharge: 2022-04-26 | Disposition: A | Discharge status: Home / Self Care | Payer: 59

## 2022-04-26 DIAGNOSIS — J40 Bronchitis, not specified as acute or chronic: Secondary | ICD-10-CM | POA: Diagnosis not present

## 2022-04-26 MED ORDER — IPRATROPIUM-ALBUTEROL 0.5-2.5 (3) MG/3ML IN SOLN
3.0000 mL | Freq: Once | RESPIRATORY_TRACT | Status: AC
  Administered 2022-04-26: 3 mL via RESPIRATORY_TRACT

## 2022-04-26 MED ORDER — CEPHALEXIN 500 MG PO CAPS: 500.0000 mg | ORAL_CAPSULE | Freq: Four times a day (QID) | ORAL | 0 refills | Status: DC

## 2022-04-26 MED ORDER — ALBUTEROL SULFATE HFA 108 (90 BASE) MCG/ACT IN AERS
2.0000 | INHALATION_SPRAY | RESPIRATORY_TRACT | 0 refills | Status: AC | PRN
Start: 2022-04-26 — End: ?

## 2022-04-26 NOTE — ED Triage Notes (Signed)
Pt c/o cough x3 weeks, chest congestion

## 2022-04-26 NOTE — ED Provider Notes (Signed)
[facility]    CSN: [id number] Arrival date & time: 04/26/22  1623      History   Chief Complaint Chief Complaint  Patient presents with   Cough    HPI Natasha Boyd is a 51 y.o. female who presents with cough x 3 weeks. Cough is productive with yellow mucous mostly in the am's. Dry at night time. Has noticed herself wheezing when she walks. Denies hx of asthma. 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 Has been having cough attacks.  Has been using her flonase, but has not done saline nose rinses.    Past Medical History:  Diagnosis Date   Allergic rhinitis    Anginal pain ([place])    Anxiety    Aortic valve stenosis    Benign neoplasm of skin    Dyspnea    GERD (gastroesophageal reflux disease)    Heart murmur    Obesity    Polydipsia    Sleep apnea    uses CPAP at night    Patient Active Problem List   Diagnosis Date Noted   Acute non-recurrent maxillary sinusitis 04/29/2021   Chest pain 11/17/2015   Pneumonia 11/03/2015   Murmur, cardiac 11/03/2015   Adult-onset obesity 03/16/2015   Allergic rhinitis 03/16/2015   Anxiety 03/16/2015   Abnormal liver enzymes 03/16/2015   Acid reflux 03/16/2015   Blood glucose elevated 03/16/2015   Hypertriglyceridemia 03/16/2015   LBP (low back pain) 03/16/2015   Apnea, sleep 03/16/2015   Screening-pulmonary TB 03/16/2015   Immunity status testing 03/16/2015   Flu vaccine need 03/16/2015   ADD (attention deficit disorder) 12/15/2014   Benign neoplasm of skin 02/13/2014    Past Surgical History:  Procedure Laterality Date   ABDOMINAL HYSTERECTOMY  2011   APPENDECTOMY  1996?   CESAREAN SECTION  2007   ESOPHAGOGASTRODUODENOSCOPY (EGD) WITH PROPOFOL N/A 03/03/2016   Procedure: ESOPHAGOGASTRODUODENOSCOPY (EGD) WITH PROPOFOL;  Surgeon: Manya Silvas, MD;  Location: [facility] ENDOSCOPY;  Service: Endoscopy;  Laterality: N/A;   RIGHT/LEFT HEART CATH AND CORONARY ANGIOGRAPHY N/A 04/16/2019    Procedure: RIGHT/LEFT HEART CATH AND CORONARY ANGIOGRAPHY;  Surgeon: Teodoro Spray, MD;  Location: [place] [facility];  Service: Cardiovascular;  Laterality: N/A;   TONSILLECTOMY  2010    OB History     Gravida  2   Para  2   Term      Preterm      AB      Living  2      SAB      IAB      Ectopic      Multiple      Live Births           Obstetric Comments  1st Menstrual Cycle: 12  1st Pregnancy: 29          Home Medications    Prior to Admission medications   Medication Sig Start Date End Date Taking? Authorizing Provider  escitalopram (LEXAPRO) 20 MG tablet TAKE 1 TABLET(20 MG) BY MOUTH DAILY 02/23/22  Yes Jerrol Banana., MD  fluticasone [facility]) 50 MCG/ACT nasal spray Place 2 sprays into the nose as needed for allergies.  10/01/14  Yes [provider]  furosemide (LASIX) 40 MG tablet Take by mouth. 07/01/19 04/26/22 Yes [provider]  metoprolol tartrate (LOPRESSOR) 25 MG tablet Take 0.5 tablets (12.5 mg total) by mouth 2 (two) times daily. 12/30/18  Yes Jerrol Banana., MD  rosuvastatin (CRESTOR) 20 MG tablet Take by mouth. 02/16/22 02/16/23 Yes [provider]  warfarin (COUMADIN) 7.5 MG tablet TAKE 1 TABLET(7.5 MG) BY MOUTH EVERY DAY 12/21/20  Yes [provider]  albuterol (VENTOLIN HFA) 108 (90 Base) MCG/ACT inhaler Inhale 2 puffs into the lungs every 4 (four) hours as needed for wheezing or shortness of breath. Prn cough attacks 04/26/22  Yes Rodriguez-Southworth, Sunday Spillers, PA-C  benzonatate (TESSALON) 200 MG capsule Take 1 capsule (200 mg total) by mouth 3 (three) times daily as needed for cough. 09/20/21   Wynona Luna, MD  cephALEXin (KEFLEX) 500 MG capsule Take 1 capsule (500 mg total) by mouth 4 (four) times daily. 04/26/22  Yes Rodriguez-Southworth, Sunday Spillers, PA-C  predniSONE (DELTASONE) 50 MG tablet Take 1 tablet (50 mg total) by mouth daily. 09/20/21   Wynona Luna, MD    Family  History Family History  Adopted: Yes  Family history unknown: Yes    Social History Social History   Tobacco Use   Smoking status: Never   Smokeless tobacco: Never  Vaping Use   Vaping Use: Never used  Substance Use Topics   Alcohol use: Yes    Comment: Occasionally   Drug use: No     Allergies   Iodine, Ivp dye [iodinated contrast media], Mushroom extract complex, and Sulfa antibiotics   Review of Systems Review of Systems  Constitutional:  Positive for fatigue. Negative for appetite change, chills, diaphoresis and fever.  HENT:  Positive for postnasal drip. Negative for congestion, ear discharge, ear pain and sore throat.   Respiratory:  Positive for cough and wheezing.        Her chest hurts to cough  Cardiovascular:  Negative for chest pain.  Musculoskeletal:  Negative for myalgias.  Skin:  Negative for rash.  Neurological:  Negative for headaches.     Physical Exam Triage Vital Signs ED Triage Vitals  Enc Vitals Group     BP 04/26/22 1659 125/81     Pulse Rate 04/26/22 1659 73     Resp --      Temp 04/26/22 1659 98.5 F (36.9 C)     Temp Source 04/26/22 1659 Oral     SpO2 04/26/22 1659 96 %     Weight 04/26/22 1658 280 lb (127 kg)     Height 04/26/22 1658 '5\' 6"'$  (1.676 m)     Head Circumference --      Peak Flow --      Pain Score 04/26/22 1657 2     Pain Loc --      Pain Edu? --      Excl. in [place]? --    No data found.  Updated Vital Signs BP 125/81 (BP Location: Left Arm)   Pulse 73   Temp 98.5 F (36.9 C) (Oral)   Ht '5\' 6"'$  (1.676 m)   Wt 280 lb (127 kg)   SpO2 96%   BMI 45.19 kg/m   Visual Acuity Right Eye Distance:   Left Eye Distance:   Bilateral Distance:    Right Eye Near:   Left Eye Near:    Bilateral Near:     Physical Exam Physical Exam Vitals signs and nursing note reviewed.  Constitutional:      General: She is not in acute distress.    Appearance: Normal appearance. She is not ill-appearing, toxic-appearing or  diaphoretic.  HENT:     Head: Normocephalic.     Right Ear: Tympanic membrane, ear canal and external ear  normal.     Left Ear: Tympanic membrane, ear canal and external ear normal.     Nose: mild mucosa congestion, has normal sinus translumination.      Mouth/Throat: clear    Mouth: Mucous membranes are moist.  Eyes:     General: No scleral icterus.       Right eye: No discharge.        Left eye: No discharge.     Conjunctiva/sclera: Conjunctivae normal.  Neck:     Musculoskeletal: Neck supple. No neck rigidity.  Cardiovascular:     Rate and Rhythm: Normal rate and regular rhythm.     Heart sounds: No murmur.  Pulmonary: Was having cough attacks in the room and after duo neb this improved as well as chest soreness and pulse ox went up to 98%    Effort: Pulmonary effort is normal.     Breath sounds: Normal breath sounds.  Musculoskeletal: Normal range of motion.  Lymphadenopathy:     Cervical: No cervical adenopathy.  Skin:    General: Skin is warm and dry.     Coloration: Skin is not jaundiced.     Findings: No rash.  Neurological:     Mental Status: She is alert and oriented to person, place, and time.     Gait: Gait normal.  Psychiatric:        Mood and Affect: Mood normal.        Behavior: Behavior normal.        Thought Content: Thought content normal.        Judgment: Judgment normal.     UC Treatments / Results  Labs (all labs ordered are listed, but only abnormal results are displayed) Labs Reviewed - No data to display  EKG   Radiology No results found.  Procedures Procedures (including critical care time)  Medications Ordered in UC Medications  ipratropium-albuterol (DUONEB) 0.5-2.5 (3) MG/3ML nebulizer solution 3 mL (3 mLs Nebulization Given 04/26/22 1730)    Initial Impression / Assessment and Plan / UC Course  I have reviewed the triage vital signs and the nursing notes.  She was given a dou neb  Was placed on Keflex and Albuterol inhaler as  noted.  Advised to do saline nose rinses.     Final Clinical Impressions(s) / UC Diagnoses   Final diagnoses:  Bronchitis   Discharge Instructions   None    ED Prescriptions     Medication Sig Dispense Auth. Provider   albuterol (VENTOLIN HFA) 108 (90 Base) MCG/ACT inhaler Inhale 2 puffs into the lungs every 4 (four) hours as needed for wheezing or shortness of breath. Prn cough attacks 18 g Rodriguez-Southworth, Bevin Das, PA-C   cephALEXin (KEFLEX) 500 MG capsule Take 1 capsule (500 mg total) by mouth 4 (four) times daily. 40 capsule Rodriguez-Southworth, Sunday Spillers, PA-C      PDMP not reviewed this encounter.   Shelby Mattocks, [place] 04/26/22 1803

## 2022-05-18 ENCOUNTER — Telehealth: Payer: Self-pay | Admitting: Family Medicine

## 2022-05-18 ENCOUNTER — Other Ambulatory Visit: Payer: Self-pay

## 2022-05-18 DIAGNOSIS — F419 Anxiety disorder, unspecified: Secondary | ICD-10-CM

## 2022-05-18 MED ORDER — ESCITALOPRAM OXALATE 20 MG PO TABS: ORAL_TABLET | ORAL | 1 refills | Status: DC

## 2022-05-18 NOTE — Telephone Encounter (Signed)
[facility] faxed refill request for the following medications:   escitalopram (LEXAPRO) 20 MG tablet   Please advise.

## 2022-06-14 ENCOUNTER — Encounter: Payer: Self-pay | Admitting: Family Medicine

## 2022-06-14 ENCOUNTER — Ambulatory Visit
Admission: RE | Admit: 2022-06-14 | Discharge: 2022-06-14 | Disposition: A | Discharge status: Home / Self Care | Payer: 59 | Source: Ambulatory Visit | Attending: Family Medicine | Admitting: Family Medicine

## 2022-06-14 ENCOUNTER — Ambulatory Visit (INDEPENDENT_AMBULATORY_CARE_PROVIDER_SITE_OTHER): Payer: PRIVATE HEALTH INSURANCE | Admitting: Family Medicine

## 2022-06-14 VITALS — BP 128/77 | HR 69 | Resp 16 | Ht 66.0 in | Wt 293.1 lb

## 2022-06-14 DIAGNOSIS — F419 Anxiety disorder, unspecified: Secondary | ICD-10-CM

## 2022-06-14 DIAGNOSIS — M5416 Radiculopathy, lumbar region: Secondary | ICD-10-CM | POA: Diagnosis not present

## 2022-06-14 DIAGNOSIS — E782 Mixed hyperlipidemia: Secondary | ICD-10-CM

## 2022-06-14 DIAGNOSIS — M545 Low back pain, unspecified: Secondary | ICD-10-CM | POA: Diagnosis not present

## 2022-06-14 DIAGNOSIS — Z6841 Body Mass Index (BMI) 40.0 and over, adult: Secondary | ICD-10-CM

## 2022-06-14 MED ORDER — BACLOFEN 20 MG PO TABS: 20.0000 mg | ORAL_TABLET | Freq: Three times a day (TID) | ORAL | 0 refills | Status: DC

## 2022-06-14 MED ORDER — ESCITALOPRAM OXALATE 20 MG PO TABS: ORAL_TABLET | ORAL | 1 refills | Status: DC

## 2022-06-14 NOTE — Patient Instructions (Addendum)
Please report to [facility] located at:  [place], [place]  You do not need an appointment to have xrays completed.   Our office will follow up with  results once available.  I have prescribed baclofen for you to take up to 3 times per day as needed for your back pain.  Please continue your anxiety medicine as prescribed.  Will plan to follow-up in 3 months for lifestyle weight management as discussed today.  Semaglutide Injection (Weight Management) What is this medication? SEMAGLUTIDE (SEM a GLOO tide) promotes weight loss. It may also be used to maintain weight loss. It works by decreasing appetite. Changes to diet and exercise are often combined with this medication. This medicine may be used for other purposes; ask your health care provider or pharmacist if you have questions. COMMON BRAND NAME(S): UUVOZD What should I tell my care team before I take this medication? They need to know if you have any of these conditions: Endocrine tumors (MEN 2) or if someone in your family had these tumors Eye disease, vision problems Gallbladder disease History of depression or mental health disease History of pancreatitis Kidney disease Stomach or intestine problems Suicidal thoughts, plans, or attempt; a previous suicide attempt by you or a family member Thyroid cancer or if someone in your family had thyroid cancer An unusual or allergic reaction to semaglutide, other medications, foods, dyes, or preservatives Pregnant or trying to get pregnant Breast-feeding How should I use this medication? This medication is injected under the skin. You will be taught how to prepare and give it. Take it as directed on the prescription label. It is given once every week (every 7 days). Keep taking it unless your care team tells you to stop. It is important that you put your used needles and pens in a special sharps container. Do not put  them in a trash can. If you do not have a sharps container, call your pharmacist or care team to get one. A special MedGuide will be given to you by the pharmacist with each prescription and refill. Be sure to read this information carefully each time. This medication comes with INSTRUCTIONS FOR USE. Ask your pharmacist for directions on how to use this medication. Read the information carefully. Talk to your pharmacist or care team if you have questions. Talk to your care team about the use of this medication in children. While it may be prescribed for children as young as 12 years for selected conditions, precautions do apply. Overdosage: If you think you have taken too much of this medicine contact a poison control center or emergency room at once. NOTE: This medicine is only for you. Do not share this medicine with others. What if I miss a dose? If you miss a dose and the next scheduled dose is more than 2 days away, take the missed dose as soon as possible. If you miss a dose and the next scheduled dose is less than 2 days away, do not take the missed dose. Take the next dose at your regular time. Do not take double or extra doses. If you miss your dose for 2 weeks or more, take the next dose at your regular time or call your care team to talk about how to restart this medication. What may interact with this medication? Insulin and other medications for diabetes This list may not describe all possible interactions. Give your health care provider a list of all  the medicines, herbs, non-prescription drugs, or dietary supplements you use. Also tell them if you smoke, drink alcohol, or use illegal drugs. Some items may interact with your medicine. What should I watch for while using this medication? Visit your care team for regular checks on your progress. It may be some time before you see the benefit from this medication. Drink plenty of fluids while taking this medication. Check with your care team  if you have severe diarrhea, nausea, and vomiting, or if you sweat a lot. The loss of too much body fluid may make it dangerous for you to take this medication. This medication may affect blood sugar levels. Ask your care team if changes in diet or medications are needed if you have diabetes. If you or your family notice any changes in your behavior, such as new or worsening depression, thoughts of harming yourself, anxiety, other unusual or disturbing thoughts, or memory loss, call your care team right away. Women should inform their care team if they wish to become pregnant or think they might be pregnant. Losing weight while pregnant is not advised and may cause harm to the unborn child. Talk to your care team for more information. What side effects may I notice from receiving this medication? Side effects that you should report to your care team as soon as possible: Allergic reactions--skin rash, itching, hives, swelling of the face, lips, tongue, or throat Change in vision Dehydration--increased thirst, dry mouth, feeling faint or lightheaded, headache, dark yellow or brown urine Gallbladder problems--severe stomach pain, nausea, vomiting, fever Heart palpitations--rapid, pounding, or irregular heartbeat Kidney injury--decrease in the amount of urine, swelling of the ankles, hands, or feet Pancreatitis--severe stomach pain that spreads to your back or gets worse after eating or when touched, fever, nausea, vomiting Thoughts of suicide or self-harm, worsening mood, feelings of depression Thyroid cancer--new mass or lump in the neck, pain or trouble swallowing, trouble breathing, hoarseness Side effects that usually do not require medical attention (report to your care team if they continue or are bothersome): Diarrhea Loss of appetite Nausea Stomach pain Vomiting This list may not describe all possible side effects. Call your doctor for medical advice about side effects. You may report side  effects to FDA at 1-800-FDA-1088. Where should I keep my medication? Keep out of the reach of children and pets. Refrigeration (preferred): Store in the refrigerator. Do not freeze. Keep this medication in the original container until you are ready to take it. Get rid of any unused medication after the expiration date. Room temperature: If needed, prior to cap removal, the pen can be stored at room temperature for up to 28 days. Protect from light. If it is stored at room temperature, get rid of any unused medication after 28 days or after it expires, whichever is first. It is important to get rid of the medication as soon as you no longer need it or it is expired. You can do this in two ways: Take the medication to a medication take-back program. Check with your pharmacy or law enforcement to find a location. If you cannot return the medication, follow the directions in the [place]. NOTE: This sheet is a summary. It may not cover all possible information. If you have questions about this medicine, talk to your doctor, pharmacist, or health care provider.  2023 Elsevier/Gold Standard (2020-08-19 00:00:00)

## 2022-06-14 NOTE — Progress Notes (Signed)
I,Joseline E Rosas,acting as a scribe for [employer], MD.,have documented all relevant documentation on the behalf of Eulis Foster, MD,as directed by  Eulis Foster, MD while in the presence of Eulis Foster, MD.  Established patient visit   Patient: Natasha Boyd   DOB: 15-Mar-1971   51 y.o. Female  MRN: [id number] Visit Date: 06/14/2022  Today's healthcare provider: Eulis Foster, MD   Chief Complaint  Patient presents with  . Follow-up Chronic Disease   Subjective    HPI  Anxiety, Follow-up  Patient currently on Lexapro 20 mg daily .   She reports excellent compliance with treatment. She reports excellent tolerance of treatment.  She feels her anxiety is mild and Improved since last visit. Stable.  Symptoms: No chest pain No difficulty concentrating  No dizziness No fatigue  No feelings of losing control No insomnia  No irritable No palpitations  Yes panic attacks-once every 6 weeks No racing thoughts  No shortness of breath No sweating  No tremors/shakes    GAD-7 Results    06/14/2022    3:14 PM 01/25/2021    3:38 PM 12/06/2017    9:30 AM  GAD-7 Generalized Anxiety Disorder Screening Tool  1. Feeling Nervous, Anxious, or on Edge '1 1 2  '$ 2. Not Being Able to Stop or Control Worrying 2 0 2  3. Worrying Too Much About Different Things '1 1 2  '$ 4. Trouble Relaxing 1 0 3  5. Being So Restless it's Hard To Sit Still 0 2 1  6. Becoming Easily Annoyed or Irritable 1 0 1  7. Feeling Afraid As If Something Awful Might Happen 0 0 0  Total GAD-7 Score '6 4 11  '$ Difficulty At Work, Home, or Getting  Along With Others? Not difficult at all Not difficult at all Somewhat difficult    PHQ-9 Scores    06/14/2022    3:17 PM 02/13/2022    8:17 AM 07/18/2019    7:49 AM  PHQ9 SCORE ONLY  PHQ-9 Total Score '6 6 4   '$ Follow-Up, Hypertension:  BP Readings from Last 3 Encounters:  06/14/22 128/77  04/26/22 125/81   02/13/22 119/82   Wt Readings from Last 3 Encounters:  06/14/22 293 lb 1.6 oz (132.9 kg)  04/26/22 280 lb (127 kg)  02/13/22 290 lb (131.5 kg)      Management since that visit includes Metoprolol.Patient is follow by Cardio.   Lipid/Cholesterol, follow-up  Last Lipid Panel: Lab Results  Component Value Date   CHOL 238 (H) 02/13/2022   LDLCALC 154 (H) 02/13/2022   HDL 38 (L) 02/13/2022   TRIG 248 (H) 02/13/2022    She was last seen for this 3-6 months ago.  Management since that visit includes Crestor. This is follow by Cardio.   Medications: Outpatient Medications Prior to Visit  Medication Sig  . albuterol (VENTOLIN HFA) 108 (90 Base) MCG/ACT inhaler Inhale 2 puffs into the lungs every 4 (four) hours as needed for wheezing or shortness of breath. Prn cough attacks  . fluticasone (FLONASE) 50 MCG/ACT nasal spray Place 2 sprays into the nose as needed for allergies.   . metoprolol tartrate (LOPRESSOR) 25 MG tablet Take 0.5 tablets (12.5 mg total) by mouth 2 (two) times daily.  . rosuvastatin (CRESTOR) 20 MG tablet Take by mouth.  . warfarin (COUMADIN) 7.5 MG tablet TAKE 1 TABLET(7.5 MG) BY MOUTH EVERY DAY  . [DISCONTINUED] escitalopram (LEXAPRO) 20 MG tablet TAKE 1 TABLET(20 MG) BY MOUTH DAILY  .  cephALEXin (KEFLEX) 500 MG capsule Take 1 capsule (500 mg total) by mouth 4 (four) times daily.  . furosemide (LASIX) 40 MG tablet Take by mouth.  . [DISCONTINUED] benzonatate (TESSALON) 200 MG capsule Take 1 capsule (200 mg total) by mouth 3 (three) times daily as needed for cough.  . [DISCONTINUED] predniSONE (DELTASONE) 50 MG tablet Take 1 tablet (50 mg total) by mouth daily.   Facility-Administered Medications Prior to Visit  Medication Dose Route Frequency Provider  . sodium chloride flush (NS) 0.9 % injection 3 mL  3 mL Intravenous Q12H Teodoro Spray, MD    Review of Systems  {Labs  Heme  Chem  Endocrine  Serology  Results Review (optional):23779}   Objective     BP 128/77 (BP Location: Left Arm, Patient Position: Sitting, Cuff Size: Large)   Pulse 69   Resp 16   Ht '5\' 6"'$  (1.676 m)   Wt 293 lb 1.6 oz (132.9 kg)   BMI 47.31 kg/m  {Show previous vital signs (optional):23777}  Physical Exam Psychiatric:        Mood and Affect: Affect normal. Mood is anxious.    Back: No gross deformity, scoliosis. +TTP .  No midline or bony TTP. FROM. Strength LEs 5/5 all muscle groups.   2+ MSRs in patellar and achilles tendons, equal bilaterally. Negative SLRs. Sensation intact to light touch bilaterally.    No results found for any visits on 06/14/22.  Assessment & Plan     Problem List Items Addressed This Visit       Other   Anxiety   Relevant Medications   escitalopram (LEXAPRO) 20 MG tablet   Other Visit Diagnoses     Lumbar back pain with radiculopathy affecting lower extremity    -  Primary   Relevant Medications   escitalopram (LEXAPRO) 20 MG tablet   baclofen (LIORESAL) 20 MG tablet   Other Relevant Orders   DG Lumbar Spine Complete        Return in about 3 months (around 09/13/2022) for Weight management.      I, Eulis Foster, MD, have reviewed all documentation for this visit.  Portions of this information were initially documented by the CMA and reviewed by me for thoroughness and accuracy.      Eulis Foster, MD  [facility] [phone] (phone) [phone] (fax)  [address] [place]

## 2022-06-15 DIAGNOSIS — Z6841 Body Mass Index (BMI) 40.0 and over, adult: Secondary | ICD-10-CM | POA: Insufficient documentation

## 2022-06-15 DIAGNOSIS — E782 Mixed hyperlipidemia: Secondary | ICD-10-CM | POA: Insufficient documentation

## 2022-06-15 NOTE — Assessment & Plan Note (Signed)
Refills provided for lexapro '20mg'$   She will continue this  Anxiety is improved, stable

## 2022-06-15 NOTE — Assessment & Plan Note (Signed)
Stable  The 10-year ASCVD risk score (Arnett DK, et al., 2019) is: 3.8% Patient prescribed crestor '20mg'$  daily  Discussed importance of dietary modifications to help with lowering cholesterol in addition to continued medical therapy

## 2022-06-15 NOTE — Assessment & Plan Note (Signed)
Weight has remained in same range for several months  Patient would like to focus on meal planning, smart choices and increasing physical activity to lose weight  We discussed options including GLP 1 medications like wegovy for weight management  She declines medication assisted weight loss today

## 2022-07-03 ENCOUNTER — Other Ambulatory Visit: Payer: Self-pay | Admitting: Family Medicine

## 2022-07-03 DIAGNOSIS — F419 Anxiety disorder, unspecified: Secondary | ICD-10-CM

## 2023-01-01 ENCOUNTER — Ambulatory Visit (INDEPENDENT_AMBULATORY_CARE_PROVIDER_SITE_OTHER): Payer: PRIVATE HEALTH INSURANCE | Admitting: Family Medicine

## 2023-01-01 ENCOUNTER — Encounter: Payer: Self-pay | Admitting: Family Medicine

## 2023-01-01 ENCOUNTER — Telehealth: Payer: Self-pay | Admitting: Family Medicine

## 2023-01-01 VITALS — BP 117/74 | HR 75 | Ht 67.0 in | Wt 297.9 lb

## 2023-01-01 DIAGNOSIS — R591 Generalized enlarged lymph nodes: Secondary | ICD-10-CM | POA: Diagnosis not present

## 2023-01-01 DIAGNOSIS — G4733 Obstructive sleep apnea (adult) (pediatric): Secondary | ICD-10-CM | POA: Diagnosis not present

## 2023-01-01 DIAGNOSIS — F419 Anxiety disorder, unspecified: Secondary | ICD-10-CM | POA: Diagnosis not present

## 2023-01-01 DIAGNOSIS — Z6841 Body Mass Index (BMI) 40.0 and over, adult: Secondary | ICD-10-CM

## 2023-01-01 MED ORDER — ESCITALOPRAM OXALATE 20 MG PO TABS
ORAL_TABLET | ORAL | 1 refills | Status: DC
Start: 2023-01-01 — End: 2023-07-31

## 2023-01-01 NOTE — Assessment & Plan Note (Signed)
Patient reports swelling in right axillary lymph node, noted by massage therapist. Patient also reports tenderness in the area. History of dense breast tissue. -Acute  -Order mammogram. -Order CBC to assess for elevated white count or changes in lymphocytes. -Order CMP to assess kidney and liver function.

## 2023-01-01 NOTE — Progress Notes (Signed)
Established patient visit   Patient: Natasha Boyd   DOB: Feb 10, 1971   52 y.o. Female  MRN: [id number] Visit Date: 01/01/2023  Today's healthcare provider: Ronnald Ramp, MD   Chief Complaint  Patient presents with   Medical Management of Chronic Issues    Patient is present for anxiety f/u. She was last seen on 06/14/22 and advised to continue lexapro 20 mg daily. Patient reports she is tolerating well with no s/e. She reports her anxiety is mild and improved since last visit. She reports symptoms of fatigue, and sob when excerted. Patient reports she went to massage therapy last week and was advised lymph node under right arm was swollen and that the top of her left leg was described as "clogged". Reports the thought of these make her a little anxious.   Subjective     HPI     Medical Management of Chronic Issues    Additional comments: Patient is present for anxiety f/u. She was last seen on 06/14/22 and advised to continue lexapro 20 mg daily. Patient reports she is tolerating well with no s/e. She reports her anxiety is mild and improved since last visit. She reports symptoms of fatigue, and sob when excerted. Patient reports she went to massage therapy last week and was advised lymph node under right arm was swollen and that the top of her left leg was described as "clogged". Reports the thought of these make her a little anxious.        Comments   -Declines shingles vaccine and colonoscopy -Ok to order mammogram      Last edited by Acey Lav, CMA on 01/01/2023  1:20 PM.      Discussed the use of AI scribe software for clinical note transcription with the patient, who gave verbal consent to proceed.  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 She reports a recent episode of significant swelling after a trip to [place], where she did a  lot of walking in the heat and did not hydrate adequately. Despite taking her diuretic daily for several days, the swelling persisted so she had her massage therapist due LN work on her.  Axillary LN Swelling During this scheduled massage therapy session, the therapist noted swelling in the right axillary lymph node and the left leg, which the patient describes as feeling 'very full.' The patient also reports a tender spot on the back of the right leg.  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 She reports no recent panic attacks, a significant improvement from previous episodes triggered by work-related stress.  Obesity, BMI 46 The patient has been making efforts to improve her physical health, including regular exercise and dietary changes. Despite these efforts, she expresses frustration with persistent abdominal weight, which she is keen to reduce due to potential future heart health implications. She has been considering weight loss medication but wishes to discuss this with her cardiologist at an upcoming appointment.  Sleep Apnea  The patient uses a sleep apnea machine nightly and reports improved sleep. She has had a hysterectomy and is not currently receiving Pap smears. She is due for a mammogram and a  heart check-up to assess her valve replacement.      GAD-7 Results    01/01/2023    1:21 PM 06/14/2022    3:14 PM 01/25/2021    3:38 PM  GAD-7 Generalized Anxiety Disorder Screening Tool  1. Feeling Nervous, Anxious, or on Edge 0 1 1  2. Not Being Able to Stop or Control Worrying 0 2 0  3. Worrying Too Much About Different Things 0 1 1  4. Trouble Relaxing 0 1 0  5. Being So Restless it's Hard To Sit Still 0 0 2  6. Becoming Easily Annoyed or Irritable 0 1 0  7.  Feeling Afraid As If Something Awful Might Happen 0 0 0  Total GAD-7 Score 0 6 4  Difficulty At Work, Home, or Getting  Along With Others? Not difficult at all Not difficult at all Not difficult at all    PHQ-9 Scores    01/01/2023    1:21 PM 06/14/2022    3:17 PM 02/13/2022    8:17 AM  PHQ9 SCORE ONLY  PHQ-9 Total Score 0 6 6    ---------------------------------------------------------------------------------------------------     Medications: Outpatient Medications Prior to Visit  Medication Sig   fluticasone (FLONASE) 50 MCG/ACT nasal spray Place 2 sprays into the nose as needed for allergies.    metoprolol tartrate (LOPRESSOR) 25 MG tablet Take 0.5 tablets (12.5 mg total) by mouth 2 (two) times daily.   rosuvastatin (CRESTOR) 20 MG tablet Take by mouth.   warfarin (COUMADIN) 7.5 MG tablet TAKE 1 TABLET(7.5 MG) BY MOUTH EVERY DAY   [DISCONTINUED] escitalopram (LEXAPRO) 20 MG tablet TAKE 1 TABLET(20 MG) BY MOUTH DAILY   albuterol (VENTOLIN HFA) 108 (90 Base) MCG/ACT inhaler Inhale 2 puffs into the lungs every 4 (four) hours as needed for wheezing or shortness of breath. Prn cough attacks (Patient not taking: Reported on 01/01/2023)   baclofen (LIORESAL) 20 MG tablet Take 1 tablet (20 mg total) by mouth 3 (three) times daily. (Patient not taking: Reported on 01/01/2023)   cephALEXin (KEFLEX) 500 MG capsule Take 1 capsule (500 mg total) by mouth 4 (four) times daily. (Patient not taking: Reported on 01/01/2023)   furosemide (LASIX) 40 MG tablet Take by mouth.   Facility-Administered Medications Prior to Visit  Medication Dose Route Frequency Provider   sodium chloride flush (NS) 0.9 % injection 3 mL  3 mL Intravenous Q12H Dalia Heading, MD    Review of Systems       Objective    BP 117/74 (BP Location: Right Arm, Patient Position: Sitting, Cuff Size: Large)   Pulse 75   Ht 5\' 7"  (1.702 m)   Wt 297 lb 14.4 oz (135.1 kg)   SpO2 97%   BMI 46.66 kg/m      Physical Exam   Physical Exam   CARDIOVASCULAR: Mechanical clicking from valve replacement present, no murmurs detected.  PULM: CTAB without wheezing, no crackles  Axillary Exam: no palpable LN bilaterally, +tenderness in right axilla, no overlying skin changes/erythema       No results found for any visits on 01/01/23.  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 No recent panic attacks reported. -Chronic -Continue current management strategies with lexapro 20mg  daily, refills provided       Relevant Medications   escitalopram (LEXAPRO) 20 MG tablet   Apnea, sleep  Chronic  Stable with CPAP use  Patient advised to continue CPAP nightly  The patient has had significant benefit from the use of CPAP machine with considerable improvements in quality of life, work production and decrease medical complaints.  The patient will need continued maintenance and care CPAP supplies (including upgrades as deemed appropriate) in order to continue treatment for sleep apnea as this is medically necessary.         Class 3 severe obesity due to excess calories with serious comorbidity and body mass index (BMI) of 45.0 to 49.9 in adult [facility]) - Primary    Patient reports efforts to increase physical activity and improve diet, but struggles with weight loss, particularly in the abdominal area. Expressed interest in weight loss medication after consultation with cardiologist. -Chronic -Discussed potential use of Ozempic/Wegovy, will revisit after consultation with cardiologist. -Encourage continuation of current diet and exercise regimen. Aiming for 150 mins per week       Relevant Orders   CBC   CMP14+EGFR   Generalized enlarged lymph nodes    Patient reports swelling in right axillary lymph node, noted by massage therapist. Patient also reports tenderness in the area.  History of dense breast tissue. -Acute  -Order mammogram. -Order CBC to assess for elevated white count or changes in lymphocytes. -Order CMP to assess kidney and liver function.      Relevant Orders   CBC   MM 3D SCREENING MAMMOGRAM BILATERAL BREAST   HIV Antibody (routine testing w rflx)     Return in about 4 months (around 05/04/2023) for CPE.         Ronnald Ramp, MD  [facility] [phone] (phone) [phone] (fax)  [facility]

## 2023-01-01 NOTE — Assessment & Plan Note (Signed)
Patient reports efforts to increase physical activity and improve diet, but struggles with weight loss, particularly in the abdominal area. Expressed interest in weight loss medication after consultation with cardiologist. -Chronic -Discussed potential use of Ozempic/Wegovy, will revisit after consultation with cardiologist. -Encourage continuation of current diet and exercise regimen. Aiming for 150 mins per week

## 2023-01-01 NOTE — Assessment & Plan Note (Signed)
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 No recent panic attacks reported. -Chronic -Continue current management strategies with lexapro 20mg  daily, refills provided

## 2023-01-01 NOTE — Assessment & Plan Note (Signed)
Chronic  Stable with CPAP use  Patient advised to continue CPAP nightly  The patient has had significant benefit from the use of CPAP machine with considerable improvements in quality of life, work production and decrease medical complaints.  The patient will need continued maintenance and care CPAP supplies (including upgrades as deemed appropriate) in order to continue treatment for sleep apnea as this is medically necessary.     

## 2023-01-01 NOTE — Patient Instructions (Addendum)
[facility] [address] [place],  [place]  [address] Get Driving Directions Main: [phone]     I have ordered your mammogram to screen for breast cancer and evaluate more regarding the tender lymph node under your right arm

## 2023-07-02 ENCOUNTER — Other Ambulatory Visit
Admission: RE | Admit: 2023-07-02 | Discharge: 2023-07-02 | Disposition: A | Discharge status: Home / Self Care | Payer: Self-pay | Source: Ambulatory Visit | Attending: Student | Admitting: Student

## 2023-07-02 DIAGNOSIS — R0602 Shortness of breath: Secondary | ICD-10-CM | POA: Insufficient documentation

## 2023-07-02 DIAGNOSIS — I5032 Chronic diastolic (congestive) heart failure: Secondary | ICD-10-CM | POA: Insufficient documentation

## 2023-07-02 LAB — BRAIN NATRIURETIC PEPTIDE: B Natriuretic Peptide: 147 pg/mL — ABNORMAL HIGH (ref 0.0–100.0)

## 2023-07-29 ENCOUNTER — Other Ambulatory Visit: Payer: Self-pay | Admitting: Family Medicine

## 2023-07-29 DIAGNOSIS — F419 Anxiety disorder, unspecified: Secondary | ICD-10-CM

## 2023-07-30 ENCOUNTER — Other Ambulatory Visit: Payer: Self-pay | Admitting: Family Medicine

## 2023-07-30 DIAGNOSIS — F419 Anxiety disorder, unspecified: Secondary | ICD-10-CM

## 2023-07-30 NOTE — Telephone Encounter (Signed)
 Medication Refill -  Most Recent Primary Care Visit:  Provider: SIMMONS-ROBINSON, MAKIERA  Department: [id number]  Visit Type: OFFICE VISIT  Date: 01/01/2023  Medication: escitalopram  (LEXAPRO ) 20 MG tablet   Has the patient contacted their pharmacy? Yes Pharmacy advised pt that they sent over a refill request on 07/29/2023 and 07/26/2023.  Is this the correct pharmacy for this prescription? Yes  This is the patient's preferred pharmacy:  [id number] [place] - [address] AT [facility] [address] [place] [address] Phone: [phone] Fax: [phone]   Has the prescription been filled recently? No  Is the patient out of the medication? Yes  Has the patient been seen for an appointment in the last year OR does the patient have an upcoming appointment? Yes  Can we respond through MyChart? Yes  Agent: Please be advised that Rx refills may take up to 3 business days. We ask that you follow-up with your pharmacy.

## 2023-07-31 NOTE — Telephone Encounter (Signed)
Called pt and left message on machine to return call to schedule OV.

## 2023-07-31 NOTE — Telephone Encounter (Signed)
Courtesy refill. Patient will need an office visit for additional refills. Requested Prescriptions  Pending Prescriptions Disp Refills   escitalopram (LEXAPRO) 20 MG tablet [Pharmacy Med Name: ESCITALOPRAM 20MG  TABLETS] 30 tablet 0    Sig: TAKE 1 TABLET(20 MG) BY MOUTH DAILY     Psychiatry:  Antidepressants - SSRI Failed - 07/31/2023  8:00 AM      Failed - Valid encounter within last 6 months    Recent Outpatient Visits           7 months ago Class 3 severe obesity due to excess calories with serious comorbidity and body mass index (BMI) of 45.0 to 49.9 in adult [facility])   [facility] Simmons-Robinson, [place], MD   1 year ago Anxiety   [facility] Hull, [place], MD   1 year ago Bloating   [place] [facility] Maple Hudson., MD   2 years ago Anxiety   [facility] Maple Hudson., MD   3 years ago Other chest pain   [facility] Maple Hudson., MD

## 2023-08-29 ENCOUNTER — Other Ambulatory Visit: Payer: Self-pay | Admitting: Family Medicine

## 2023-08-29 DIAGNOSIS — F419 Anxiety disorder, unspecified: Secondary | ICD-10-CM

## 2023-08-30 NOTE — Telephone Encounter (Signed)
 Pt. Has appointment. Requested Prescriptions  Pending Prescriptions Disp Refills   escitalopram (LEXAPRO) 20 MG tablet [Pharmacy Med Name: ESCITALOPRAM 20MG  TABLETS] 30 tablet 1    Sig: TAKE 1 TABLET(20 MG) BY MOUTH DAILY     Psychiatry:  Antidepressants - SSRI Failed - 08/30/2023 10:01 AM      Failed - Valid encounter within last 6 months    Recent Outpatient Visits           8 months ago Class 3 severe obesity due to excess calories with serious comorbidity and body mass index (BMI) of 45.0 to 49.9 in adult [facility])   [facility] Simmons-Robinson, [place], MD   1 year ago Anxiety   [place] [facility] [place], [place], MD   1 year ago Bloating   [place] [facility] Maple Hudson., MD   2 years ago Anxiety   [facility] Maple Hudson., MD   3 years ago Other chest pain   [facility] Maple Hudson., MD

## 2023-09-06 ENCOUNTER — Ambulatory Visit (INDEPENDENT_AMBULATORY_CARE_PROVIDER_SITE_OTHER): Payer: Self-pay | Admitting: Family Medicine

## 2023-09-06 ENCOUNTER — Encounter: Payer: Self-pay | Admitting: Family Medicine

## 2023-09-06 ENCOUNTER — Ambulatory Visit
Admission: RE | Admit: 2023-09-06 | Discharge: 2023-09-06 | Disposition: A | Discharge status: Home / Self Care | Source: Ambulatory Visit | Attending: Family Medicine | Admitting: Family Medicine

## 2023-09-06 VITALS — BP 97/67 | HR 65 | Ht 66.0 in | Wt 300.0 lb

## 2023-09-06 DIAGNOSIS — K219 Gastro-esophageal reflux disease without esophagitis: Secondary | ICD-10-CM

## 2023-09-06 DIAGNOSIS — J3089 Other allergic rhinitis: Secondary | ICD-10-CM | POA: Diagnosis not present

## 2023-09-06 DIAGNOSIS — R1907 Generalized intra-abdominal and pelvic swelling, mass and lump: Secondary | ICD-10-CM | POA: Insufficient documentation

## 2023-09-06 DIAGNOSIS — E782 Mixed hyperlipidemia: Secondary | ICD-10-CM

## 2023-09-06 DIAGNOSIS — I350 Nonrheumatic aortic (valve) stenosis: Secondary | ICD-10-CM

## 2023-09-06 DIAGNOSIS — G4733 Obstructive sleep apnea (adult) (pediatric): Secondary | ICD-10-CM

## 2023-09-06 DIAGNOSIS — E781 Pure hyperglyceridemia: Secondary | ICD-10-CM

## 2023-09-06 DIAGNOSIS — F900 Attention-deficit hyperactivity disorder, predominantly inattentive type: Secondary | ICD-10-CM

## 2023-09-06 DIAGNOSIS — Z1231 Encounter for screening mammogram for malignant neoplasm of breast: Secondary | ICD-10-CM

## 2023-09-06 DIAGNOSIS — F419 Anxiety disorder, unspecified: Secondary | ICD-10-CM

## 2023-09-06 MED ORDER — ESCITALOPRAM OXALATE 20 MG PO TABS: ORAL_TABLET | ORAL | 3 refills | Status: AC

## 2023-09-06 NOTE — Progress Notes (Signed)
 Established patient visit   Patient: Natasha Boyd   DOB: 04/15/1971   53 y.o. Female  MRN: [id number] Visit Date: 09/06/2023  Today's healthcare provider: Ronnald Ramp, MD   Chief Complaint  Patient presents with   Medication Review    No questions nor concerns    Subjective     HPI     Medication Review    Additional comments: No questions nor concerns       Last edited by Thedora Hinders, CMA on 09/06/2023  9:55 AM.       Discussed the use of AI scribe software for clinical note transcription with the patient, who gave verbal consent to proceed.  History of Present Illness Natasha Boyd is a 53 year old female with severe aortic valve stenosis post mechanical aortic valve replacement who presents for chronic follow-up and medication management.  She has a history of severe aortic valve stenosis and underwent a mechanical aortic valve replacement in 2020. She recently saw her cardiologist on August 27, 2023, and continues on warfarin with an INR goal between 2 and 3. She is also managing chronic diastolic congestive heart failure with her cardiologist, taking Lasix 60 mg daily for five days, which was completed by March 15, and will follow up with a basic metabolic panel. She continues on metoprolol 12.5 mg twice daily and Jardiance 10 mg daily.  She is experiencing fluid accumulation in her abdomen, which has been difficult to control. Her ankles are swollen, and her marker for congestive heart failure is elevated. She wants the fluid to be drained, as it causes discomfort and shortness of breath, especially when walking. 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  For her obstructive sleep apnea, she continues to use CPAP and has been referred to a pulmonologist for a new CPAP machine. She reports sleeping for long hours but still feeling tired,  and her current CPAP machine is over ten years old.  Her hyperlipidemia is being managed with Crestor 20 mg daily. Her LDL was within goal range in July 2024, but triglycerides were elevated at 240.  She is also on Lexapro 20 mg daily for anxiety and reports that it is effective. She experienced a lapse in medication for three days due to insurance issues, which negatively impacted her mood. She is also taking warfarin 6 mg on Monday and Friday, and 5 mg on other days, and Lasix 40 mg daily after completing the increased dose regimen.     Past Medical History:  Diagnosis Date   Allergic rhinitis    Anginal pain (HCC)    Anxiety    Aortic valve stenosis    Benign neoplasm of skin    Dyspnea    GERD (gastroesophageal reflux disease)    Heart murmur    Obesity    Polydipsia    Sleep apnea    uses CPAP at night    Medications: Outpatient Medications Prior to Visit  Medication Sig   fluticasone (FLONASE) 50 MCG/ACT nasal spray Place 2 sprays into the nose as needed for allergies.    JARDIANCE 10 MG TABS tablet Take 10 mg by mouth daily.   metoprolol tartrate (LOPRESSOR) 25 MG tablet Take 0.5 tablets (12.5 mg total) by mouth 2 (two) times daily.   warfarin (COUMADIN) 7.5 MG tablet 6mg  on M/F and 5mg  T/W/T/Sa/Sun   [DISCONTINUED] escitalopram (  LEXAPRO) 20 MG tablet TAKE 1 TABLET(20 MG) BY MOUTH DAILY   albuterol (VENTOLIN HFA) 108 (90 Base) MCG/ACT inhaler Inhale 2 puffs into the lungs every 4 (four) hours as needed for wheezing or shortness of breath. Prn cough attacks (Patient not taking: Reported on 09/06/2023)   furosemide (LASIX) 40 MG tablet Take by mouth.   rosuvastatin (CRESTOR) 20 MG tablet Take by mouth.   [DISCONTINUED] baclofen (LIORESAL) 20 MG tablet Take 1 tablet (20 mg total) by mouth 3 (three) times daily. (Patient not taking: Reported on 09/06/2023)   [DISCONTINUED] cephALEXin (KEFLEX) 500 MG capsule Take 1 capsule (500 mg total) by mouth 4 (four) times daily. (Patient  not taking: Reported on 09/06/2023)   Facility-Administered Medications Prior to Visit  Medication Dose Route Frequency Provider   sodium chloride flush (NS) 0.9 % injection 3 mL  3 mL Intravenous Q12H Dalia Heading, MD    Review of Systems  Last CBC Lab Results  Component Value Date   WBC 6.7 02/13/2022   HGB 15.0 02/13/2022   HCT 45.5 02/13/2022   MCV 89 02/13/2022   MCH 29.4 02/13/2022   RDW 13.6 02/13/2022   PLT 197 02/13/2022   Last metabolic panel Lab Results  Component Value Date   GLUCOSE 94 02/13/2022   NA 140 02/13/2022   K 5.1 02/13/2022   CL 102 02/13/2022   CO2 25 02/13/2022   BUN 15 02/13/2022   CREATININE 1.03 (H) 02/13/2022   EGFR 66 02/13/2022   CALCIUM 9.4 02/13/2022   PROT 6.9 02/13/2022   ALBUMIN 4.2 02/13/2022   LABGLOB 2.7 02/13/2022   AGRATIO 1.6 02/13/2022   BILITOT 0.3 02/13/2022   ALKPHOS 83 02/13/2022   AST 23 02/13/2022   ALT 27 02/13/2022   ANIONGAP 7 05/24/2018   Last lipids Lab Results  Component Value Date   CHOL 238 (H) 02/13/2022   HDL 38 (L) 02/13/2022   LDLCALC 154 (H) 02/13/2022   TRIG 248 (H) 02/13/2022   CHOLHDL 6.3 (H) 02/13/2022   Last hemoglobin A1c Lab Results  Component Value Date   HGBA1C 5.7 08/28/2014   Last thyroid functions Lab Results  Component Value Date   TSH 2.130 02/13/2022        Objective    BP 97/67   Pulse 65   Ht 5\' 6"  (1.676 m)   Wt 300 lb (136.1 kg)   SpO2 97%   BMI 48.42 kg/m  BP Readings from Last 3 Encounters:  09/06/23 97/67  01/01/23 117/74  06/14/22 128/77   Wt Readings from Last 3 Encounters:  09/06/23 300 lb (136.1 kg)  01/01/23 297 lb 14.4 oz (135.1 kg)  06/14/22 293 lb 1.6 oz (132.9 kg)        Physical Exam  Physical Exam VITALS: BP- 97/60 ABDOMEN: No pitting sacral edema, abdominal distention, normal BS  No results found for any visits on 09/06/23.   Assessment & Plan     Problem List Items Addressed This Visit       Cardiovascular and  Mediastinum   Severe aortic valve stenosis     Respiratory   Apnea, sleep - Primary   Allergic rhinitis     Digestive   Acid reflux     Other   Mixed hyperlipidemia   Hypertriglyceridemia   Anxiety   Relevant Medications   escitalopram (LEXAPRO) 20 MG tablet   ADD (attention deficit disorder)   Abdominal swelling, generalized   Relevant Orders   [place] ASCITES (ABDOMEN LIMITED)  Other Visit Diagnoses       Breast cancer screening by mammogram       Relevant Orders   MM 3D SCREENING MAMMOGRAM BILATERAL BREAST        Assessment & Plan Ascites She is experiencing ascites, likely related to chronic diastolic congestive heart failure, causing discomfort and dyspnea, particularly during exertion. Gastroenterology referral is scheduled in four months, but she seeks immediate intervention, such as paracentesis, due to symptom severity. Previous imaging in 2023 was unremarkable, but the condition has worsened. An abdominal ultrasound is planned to confirm ascites and expedite intervention. - Order abdominal ultrasound stat to assess for ascites - Coordinate with interventional radiology for potential paracentesis if ascites is confirmed  Chronic diastolic congestive heart failure She is managing chronic diastolic congestive heart failure with Lasix, metoprolol, and Jardiance. Elevated BNP levels indicate worsening heart failure, with persistent fluid retention despite diuretic adjustments. A stress test is planned due to exertional chest tightness. - Continue metoprolol 12.5 mg twice daily - Continue Jardiance 10 mg daily - Continue Lasix 40 mg daily - Follow up with cardiologist for stress test and further management  Obstructive sleep apnea She has obstructive sleep apnea and uses a CPAP machine, which is outdated. She reports excessive daytime sleepiness and snoring, indicating suboptimal treatment. Referral to a pulmonologist for a new CPAP machine is in place. - Continue using  CPAP machine - Follow up with pulmonologist for new CPAP machine  Hyperlipidemia Her LDL levels were within the goal range in July 2024, but triglycerides were elevated at 240. She is on Crestor for lipid management, with ongoing monitoring by her cardiologist. - Continue Crestor 20 mg daily - Follow up with cardiologist for lipid management  Anxiety She is on Lexapro for anxiety management, which is effective. She experienced increased anxiety after a three-day lapse in medication and requests a refill to prevent future lapses. - Refill Lexapro 20 mg daily for one year  General Health Maintenance She has had a complete hysterectomy and does not require further Pap smears. She is encouraged to maintain regular physical activity and has been walking regularly to improve her health. - Schedule a physical exam in six months  Follow-up She has multiple follow-up appointments scheduled with specialists to manage her various conditions. - Follow up with cardiologist in April - Ensure follow-up with gastroenterology in July - Schedule follow-up with pulmonologist for CPAP machine     Return in about 6 months (around 03/08/2024) for CPE.         Ronnald Ramp, MD  [facility] [phone] (phone) [phone] (fax)  [facility]

## 2023-09-07 ENCOUNTER — Encounter: Payer: Self-pay | Admitting: Family Medicine

## 2023-09-07 ENCOUNTER — Ambulatory Visit

## 2023-10-03 ENCOUNTER — Ambulatory Visit: Payer: Self-pay | Admitting: Family Medicine

## 2024-03-10 ENCOUNTER — Encounter: Admitting: Family Medicine

## 2024-03-10 NOTE — Progress Notes (Deleted)
 Complete physical exam   Patient: Natasha Boyd   DOB: Oct 25, 1970   53 y.o. Female  MRN: [id number] Visit Date: 03/10/2024  Today's healthcare provider: Rockie Agent, MD   No chief complaint on file.  Subjective    Natasha Boyd is a 53 y.o. female who presents today for a complete physical exam.    She {does/does not:200015} have additional problems to discuss today.   Discussed the use of AI scribe software for clinical note transcription with the patient, who gave verbal consent to proceed.  History of Present Illness      Past Medical History:  Diagnosis Date   Allergic rhinitis    Anginal pain (HCC)    Anxiety    Aortic valve stenosis    Benign neoplasm of skin    Dyspnea    GERD (gastroesophageal reflux disease)    Heart murmur    Obesity    Polydipsia    Sleep apnea    uses CPAP at night   Past Surgical History:  Procedure Laterality Date   ABDOMINAL HYSTERECTOMY  2011   APPENDECTOMY  1996?   CESAREAN SECTION  2007   ESOPHAGOGASTRODUODENOSCOPY (EGD) WITH PROPOFOL  N/A 03/03/2016   Procedure: ESOPHAGOGASTRODUODENOSCOPY (EGD) WITH PROPOFOL ;  Surgeon: Lamar ONEIDA Holmes, MD;  Location: [facility];  Service: Endoscopy;  Laterality: N/A;   RIGHT/LEFT HEART CATH AND CORONARY ANGIOGRAPHY N/A 04/16/2019   Procedure: RIGHT/LEFT HEART CATH AND CORONARY ANGIOGRAPHY;  Surgeon: Bosie Vinie LABOR, MD;  Location: [facility];  Service: Cardiovascular;  Laterality: N/A;   TONSILLECTOMY  2010   Social History   Socioeconomic History   Marital status: Married    Spouse name: Not on file   Number of children: Not on file   Years of education: Not on file   Highest education level: Not on file  Occupational History   Not on file  Tobacco Use   Smoking status: Never   Smokeless tobacco: Never  Vaping Use   Vaping status: Never Used  Substance and Sexual Activity   Alcohol use: Yes    Comment: Occasionally   Drug use: No    Sexual activity: Yes    Birth control/protection: None  Other Topics Concern   Not on file  Social History Narrative   Not on file   Social Drivers of Health   Financial Resource Strain: Low Risk  (09/06/2023)   Overall Financial Resource Strain (CARDIA)    Difficulty of Paying Living Expenses: Not hard at all  Food Insecurity: No Food Insecurity (09/06/2023)   Hunger Vital Sign    Worried About Running Out of Food in the Last Year: Never true    Ran Out of Food in the Last Year: Never true  Transportation Needs: No Transportation Needs (09/06/2023)   PRAPARE - Administrator, Civil Service (Medical): No    Lack of Transportation (Non-Medical): No  Physical Activity: Not on file  Stress: Stress Concern Present (09/06/2023)   [employer] of Occupational Health - Occupational Stress Questionnaire    Feeling of Stress : To some extent  Social Connections: Not on file  Intimate Partner Violence: Not At Risk (09/06/2023)   Humiliation, Afraid, Rape, and Kick questionnaire    Fear of Current or Ex-Partner: No    Emotionally Abused: No    Physically Abused: No    Sexually Abused: No   Family Status  Relation Name Status   Mother  Unknown   Father  Unknown  No partnership data on file   Family History  Adopted: Yes  Family history unknown: Yes   Allergies  Allergen Reactions   Iodine Rash    Anaphylaxis if injected.    Ivp Dye [Iodinated Contrast Media] Anaphylaxis   Mushroom Extract Complex (Obsolete) Anaphylaxis   Sulfa Antibiotics Nausea Only     Medications: Outpatient Medications Prior to Visit  Medication Sig   albuterol  (VENTOLIN  HFA) 108 (90 Base) MCG/ACT inhaler Inhale 2 puffs into the lungs every 4 (four) hours as needed for wheezing or shortness of breath. Prn cough attacks (Patient not taking: Reported on 09/06/2023)   escitalopram  (LEXAPRO ) 20 MG tablet TAKE 1 TABLET(20 MG) BY MOUTH DAILY   fluticasone  (FLONASE ) 50 MCG/ACT nasal spray Place 2  sprays into the nose as needed for allergies.    furosemide (LASIX) 40 MG tablet Take by mouth.   JARDIANCE 10 MG TABS tablet Take 10 mg by mouth daily.   metoprolol  tartrate (LOPRESSOR ) 25 MG tablet Take 0.5 tablets (12.5 mg total) by mouth 2 (two) times daily.   rosuvastatin (CRESTOR) 20 MG tablet Take by mouth.   warfarin (COUMADIN) 7.5 MG tablet 6mg  on M/F and 5mg  T/W/T/Sa/Sun   Facility-Administered Medications Prior to Visit  Medication Dose Route Frequency Provider   sodium chloride  flush (NS) 0.9 % injection 3 mL  3 mL Intravenous Q12H Bosie Vinie LABOR, MD    Review of Systems  {Insert previous labs (optional):23779} {See past labs  Heme  Chem  Endocrine  Serology  Results Review (optional):1}  Objective    There were no vitals taken for this visit. {Insert last BP/Wt (optional):23777}{See vitals history (optional):1}    Physical Exam  ***  Last depression screening scores    09/06/2023   10:02 AM 01/01/2023    1:21 PM 06/14/2022    3:17 PM  PHQ 2/9 Scores  PHQ - 2 Score 0 0 1  PHQ- 9 Score 4  6    Last fall risk screening    01/01/2023    1:20 PM  Fall Risk   Falls in the past year? 0  Injury with Fall? 0  Risk for fall due to : No Fall Risks  Follow up Falls evaluation completed    Last Audit-C alcohol use screening    09/06/2023    9:59 AM  Alcohol Use Disorder Test (AUDIT)  1. How often do you have a drink containing alcohol? 1  2. How many drinks containing alcohol do you have on a typical day when you are drinking? 0  3. How often do you have six or more drinks on one occasion? 0  AUDIT-C Score 1   A score of 3 or more in women, and 4 or more in men indicates increased risk for alcohol abuse, EXCEPT if all of the points are from question 1   No results found for any visits on 03/10/24.  Assessment & Plan    Routine Health Maintenance and Physical Exam  Immunization History  Administered Date(s) Administered   INFLUENZA, HIGH DOSE  SEASONAL PF 04/21/2020   Influenza Nasal 04/28/2010   Influenza,inj,Quad PF,6+ Mos 03/16/2015   Influenza-Unspecified 03/21/2018, 04/02/2022   Moderna Sars-Covid-2 Vaccination 08/23/2019, 09/20/2019   Tdap 03/16/2015    Health Maintenance  Topic Date Due   HIV Screening  Never done   Pneumococcal Vaccine: 50+ Years (1 of 2 - PCV) Never done   Hepatitis B Vaccines 19-59 Average Risk (1 of 3 - 19+ 3-dose series)  Never done   Zoster Vaccines- Shingrix (1 of 2) Never done   Mammogram  01/02/2021   Influenza Vaccine  01/18/2024   COVID-19 Vaccine (3 - 2025-26 season) 02/18/2024   Colonoscopy  09/05/2024 (Originally 03/03/2015)   DTaP/Tdap/Td (2 - Td or Tdap) 03/15/2025   Hepatitis C Screening  Completed   HPV VACCINES  Aged Out   Meningococcal B Vaccine  Aged Out    Problem List Items Addressed This Visit   None   Assessment and Plan Assessment & Plan        No follow-ups on file.       Rockie Agent, MD  [facility] [phone] (phone) [phone] (fax)  [facility]

## 2024-06-20 ENCOUNTER — Ambulatory Visit: Payer: Self-pay

## 2024-06-20 NOTE — Telephone Encounter (Signed)
 FYI Only or Action Required?: FYI only for provider: appointment scheduled on 06/23/24 with [facility].  Patient was last seen in primary care on 09/06/2023 by Sharma Coyer, MD.  Called Nurse Triage reporting Diarrhea.  Symptoms began several weeks ago.  Interventions attempted: Rest, hydration, or home remedies.  Symptoms are: stable.  Triage Disposition: No disposition on file.  Patient/caregiver understands and will follow disposition?:   Reason for Disposition  [1] MILD diarrhea (e.g., 1-3 or more stools than normal in past 24 hours) AND [2] present >  7 days  (Exception: Chronic diarrhea that is not worse.)  Answer Assessment - Initial Assessment Questions Patient reports years ago being told she has IBS and then heard nothing about it. Hx of hemorrhoids, end of October painful to use the bathroom, small amount of blood, not usual for patient to experience that. Pt takes blood thinners. Scheduled with [facility] Monday.  1. DIARRHEA SEVERITY: How bad is the diarrhea? How many more stools have you had in the past 24 hours than normal?      5 times  2. ONSET: When did the diarrhea begin?      November, on and off, since mid December its constant  3. STOOL DESCRIPTION:  How loose or watery is the diarrhea? What is the stool color? Is there any blood or mucous in the stool?     Sometimes all water, then unformed or loose  4. VOMITING: Are you also vomiting? If Yes, ask: How many times in the past 24 hours?      Denies  5. ABDOMEN PAIN: Are you having any abdomen pain? If Yes, ask: What does it feel like? (e.g., crampy, dull, intermittent, constant)      Cramping and sore  6. ABDOMEN PAIN SEVERITY: If present, ask: How bad is the pain?  (e.g., Scale 1-10; mild, moderate, or severe)     2-3/10  7. ORAL INTAKE: If vomiting, Have you been able to drink liquids? How much liquids have you had in the past 24 hours?     Yes, low  appetite  8. HYDRATION: Any signs of dehydration? (e.g., dry mouth [not just dry lips], too weak to stand, dizziness, new weight loss) When did you last urinate?     Drinking enough but always feels thirsty  9. EXPOSURE: Have you traveled to a foreign country recently? Have you been exposed to anyone with diarrhea? Could you have eaten any food that was spoiled?     Denies  10. ANTIBIOTIC USE: Are you taking antibiotics now or have you taken antibiotics in the past 2 months?       Denies  11. OTHER SYMPTOMS: Do you have any other symptoms? (e.g., fever, blood in stool)       Fatigue, stool has clear mucous mixed in. Mild dizziness  Protocols used: [facility]   Message from Vanessa G sent at 06/20/2024 12:44 PM EST  Reason for Triage: Patient reporting diarrhea off and on for the last couple of months, has now gotten worse within these last couple of weeks.

## 2024-06-23 ENCOUNTER — Ambulatory Visit: Admitting: Internal Medicine

## 2024-06-23 ENCOUNTER — Other Ambulatory Visit: Payer: Self-pay

## 2024-06-23 ENCOUNTER — Encounter: Payer: Self-pay | Admitting: Internal Medicine

## 2024-06-23 VITALS — BP 128/72 | HR 74 | Temp 98.5°F | Resp 16 | Ht 66.0 in | Wt 284.9 lb

## 2024-06-23 DIAGNOSIS — J3489 Other specified disorders of nose and nasal sinuses: Secondary | ICD-10-CM | POA: Diagnosis not present

## 2024-06-23 DIAGNOSIS — Z1211 Encounter for screening for malignant neoplasm of colon: Secondary | ICD-10-CM | POA: Diagnosis not present

## 2024-06-23 DIAGNOSIS — R7309 Other abnormal glucose: Secondary | ICD-10-CM | POA: Diagnosis not present

## 2024-06-23 DIAGNOSIS — K529 Noninfective gastroenteritis and colitis, unspecified: Secondary | ICD-10-CM

## 2024-06-23 NOTE — Progress Notes (Signed)
 "  Acute Office Visit  Subjective:     Patient ID: Natasha Boyd, female    DOB: [birth year], 54 y.o.   MRN: [id number]  Chief Complaint  Patient presents with   Diarrhea    For 2 months was on and off now constant    Diarrhea  Associated symptoms include abdominal pain. Pertinent negatives include no chills, fever or vomiting.   Patient is in today for diarrhea x 2 months. She is new to me.   Discussed the use of AI scribe software for clinical note transcription with the patient, who gave verbal consent to proceed.  History of Present Illness Natasha Boyd is a 54 year old female with IBS and heart valve replacement who presents with chronic diarrhea and GI upset.  Symptoms began in mid-October with hemorrhoid bleeding and intermittent diarrhea that became persistent after Christmas, with four to six loose to watery bowel movements daily. She denies abdominal pain, fever, or dark stools. She is worried about the long interval since her last colonoscopy.  Her IBS had been well controlled until these recent symptoms. She started a probiotic and prebiotic supplement (Previtalize) in October and is concerned it interacted with her warfarin for her heart valve replacement. She stopped the supplement on Saturday and her diarrhea improved by Monday. She reports no other recent changes in medications or lifestyle.  Since early December she has had sinus pressure and pain that improved with Flonase  and Mucinex spray. After a forceful sneeze she had drainage but still has discomfort in her left ear and sinus area.  She notes her feet go numb while sitting on the toilet, which she attributes to prolonged sitting from frequent bowel movements. She is concerned about low blood sugar episodes and reports her last blood sugar check in February was slightly elevated. She reports lethargy and no recent changes in stress or medications.    Review of Systems  Constitutional:  Negative for  chills and fever.  Gastrointestinal:  Positive for abdominal pain and diarrhea. Negative for nausea and vomiting.        Objective:    BP 128/72 (Cuff Size: Large)   Pulse 74   Temp 98.5 F (36.9 C) (Oral)   Resp 16   Ht 5' 6 (1.676 m)   Wt 284 lb 14.4 oz (129.2 kg)   SpO2 97%   BMI 45.98 kg/m  BP Readings from Last 3 Encounters:  06/23/24 128/72  09/06/23 97/67  01/01/23 117/74   Wt Readings from Last 3 Encounters:  06/23/24 284 lb 14.4 oz (129.2 kg)  09/06/23 300 lb (136.1 kg)  01/01/23 297 lb 14.4 oz (135.1 kg)      Physical Exam Constitutional:      Appearance: Normal appearance.  HENT:     Head: Normocephalic and atraumatic.     Right Ear: Tympanic membrane, ear canal and external ear normal.     Left Ear: Tympanic membrane, ear canal and external ear normal.  Eyes:     Conjunctiva/sclera: Conjunctivae normal.  Cardiovascular:     Rate and Rhythm: Normal rate and regular rhythm.  Pulmonary:     Effort: Pulmonary effort is normal.     Breath sounds: Normal breath sounds.  Abdominal:     General: There is no distension.     Palpations: Abdomen is soft.     Tenderness: There is abdominal tenderness. There is no guarding or rebound.     Comments: BS hypoactive, mild tenderness to palpation in the  LLQ  Skin:    General: Skin is warm and dry.  Neurological:     General: No focal deficit present.     Mental Status: She is alert. Mental status is at baseline.  Psychiatric:        Mood and Affect: Mood normal.        Behavior: Behavior normal.     No results found for any visits on 06/23/24.      Assessment & Plan:   Assessment & Plan Chronic diarrhea Intermittent diarrhea since mid-October, worsened post-Christmas. Symptoms improved after stopping probiotic with black pepper fruit extract and turmeric, which may interact with warfarin. IBS flare-up likely; bacterial illness less likely. - Discontinued probiotic supplement. - Ordered CBC, kidney,  liver, and electrolyte panels. - Increase dietary fiber intake. - Maintain hydration. - Monitor for abdominal pain, bleeding, dark stools, fever, or recurrence of diarrhea.  Colon cancer screening Due for colon cancer screening. Colonoscopy preferred due to IBS history and recent diarrhea. Cologuard not recommended due to potential inflammation and hemorrhoids. - Referred to [facility] for colonoscopy.  Sinus congestion Recent sinus congestion resolved after sneezing. Left ear discomfort due to sinus pressure changes. No infection or fluid in ear. - Continue using Flonase  and Mucinex spray. - Use nasal saline before Flonase  to prevent mucosal dryness. - Consider using a humidifier in the bedroom.  Elevated glucose Concern about elevated glucose. Last A1c in February slightly elevated. No diabetes diagnosis, but monitoring advised. - Ordered A1c test.    Return if symptoms worsen or fail to improve.  Sharyle Fischer, DO   "

## 2024-06-24 ENCOUNTER — Ambulatory Visit: Payer: Self-pay | Admitting: Internal Medicine

## 2024-06-24 LAB — COMPREHENSIVE METABOLIC PANEL WITH GFR
AG Ratio: 1.6 (calc) (ref 1.0–2.5)
ALT: 16 U/L (ref 6–29)
AST: 18 U/L (ref 10–35)
Albumin: 4.2 g/dL (ref 3.6–5.1)
Alkaline phosphatase (APISO): 85 U/L (ref 37–153)
BUN: 13 mg/dL (ref 7–25)
CO2: 28 mmol/L (ref 20–32)
Calcium: 9.1 mg/dL (ref 8.6–10.4)
Chloride: 104 mmol/L (ref 98–110)
Creat: 0.84 mg/dL (ref 0.50–1.03)
Globulin: 2.6 g/dL (ref 1.9–3.7)
Glucose, Bld: 83 mg/dL (ref 65–99)
Potassium: 4.2 mmol/L (ref 3.5–5.3)
Sodium: 142 mmol/L (ref 135–146)
Total Bilirubin: 0.4 mg/dL (ref 0.2–1.2)
Total Protein: 6.8 g/dL (ref 6.1–8.1)
eGFR: 83 mL/min/1.73m2

## 2024-06-24 LAB — CBC WITH DIFFERENTIAL/PLATELET
Absolute Lymphocytes: 1762 {cells}/uL (ref 850–3900)
Absolute Monocytes: 792 {cells}/uL (ref 200–950)
Basophils Absolute: 40 {cells}/uL (ref 0–200)
Basophils Relative: 0.6 %
Eosinophils Absolute: 191 {cells}/uL (ref 15–500)
Eosinophils Relative: 2.9 %
HCT: 45 % (ref 35.9–46.0)
Hemoglobin: 14.7 g/dL (ref 11.7–15.5)
MCH: 28.8 pg (ref 27.0–33.0)
MCHC: 32.7 g/dL (ref 31.6–35.4)
MCV: 88.2 fL (ref 81.4–101.7)
MPV: 12.1 fL (ref 7.5–12.5)
Monocytes Relative: 12 %
Neutro Abs: 3815 {cells}/uL (ref 1500–7800)
Neutrophils Relative %: 57.8 %
Platelets: 188 Thousand/uL (ref 140–400)
RBC: 5.1 Million/uL (ref 3.80–5.10)
RDW: 13.5 % (ref 11.0–15.0)
Total Lymphocyte: 26.7 %
WBC: 6.6 Thousand/uL (ref 3.8–10.8)

## 2024-06-24 LAB — HEMOGLOBIN A1C
Hgb A1c MFr Bld: 5.7 % — ABNORMAL HIGH
Mean Plasma Glucose: 117 mg/dL
eAG (mmol/L): 6.5 mmol/L
